# Patient Record
Sex: Female | Born: 1990 | Race: Black or African American | Hispanic: No | Smoking: Never smoker
Health system: Southern US, Community
[De-identification: ages and names within clinical notes are randomized; demographics above are authoritative.]

## PROBLEM LIST (undated history)

## (undated) ENCOUNTER — Inpatient Hospital Stay: Payer: Self-pay

## (undated) DIAGNOSIS — G43909 Migraine, unspecified, not intractable, without status migrainosus: Secondary | ICD-10-CM

## (undated) HISTORY — PX: WRIST SURGERY: SHX841

---
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
## 2014-06-29 NOTE — L&D Delivery Note (Signed)
VAGINAL DELIVERY NOTE:  Date of Delivery: 01/31/2015 [facility] Gestational Age/EDD: [redacted]w[redacted]d 02/15/2015, by Ultrasound Antepartum complications: none Attending Physician: Annamarie Major, MD, FACOG Delivery Type: spontaneous vaginal delivery  Anesthesia: none Laceration: n/a Episiotomy: none Placenta: spontaneous Intrapartum complications: None Estimated Blood Loss: GBS: pos Procedure Details: NSVD of viable female infant in OA pos with rotation to ROA and no CAN. Baby delivered: Vtx, ant and post shoulder and body del at    . To mom's abd wth 3 VC and CCx2 and cut per dad. Bonding with baby for skin to skin. No cord blood needed. SDOP intact. No sutures needed. 1 lt periurethral abrasion but, non-bleeding. FF and lochia mod. Hemostasis achieved. VSS.   Baby: Liveborn female, Apgars 9/9, weight 7#, 12oz

## 2014-07-31 LAB — OB RESULTS CONSOLE RUBELLA ANTIBODY, IGM: Rubella: IMMUNE

## 2014-07-31 LAB — OB RESULTS CONSOLE RPR: RPR: NONREACTIVE

## 2014-07-31 LAB — OB RESULTS CONSOLE VARICELLA ZOSTER ANTIBODY, IGG: Varicella: IMMUNE

## 2014-07-31 LAB — OB RESULTS CONSOLE GC/CHLAMYDIA
Chlamydia: NEGATIVE
GC PROBE AMP, GENITAL: NEGATIVE

## 2015-01-06 ENCOUNTER — Observation Stay
Admission: EM | Admit: 2015-01-06 | Discharge: 2015-01-06 | Disposition: A | Discharge status: Home / Self Care | Payer: 59 | Attending: Obstetrics and Gynecology | Admitting: Obstetrics and Gynecology

## 2015-01-06 DIAGNOSIS — Z3A34 34 weeks gestation of pregnancy: Secondary | ICD-10-CM | POA: Insufficient documentation

## 2015-01-06 DIAGNOSIS — Z3490 Encounter for supervision of normal pregnancy, unspecified, unspecified trimester: Secondary | ICD-10-CM

## 2015-01-06 NOTE — Progress Notes (Signed)
Nitrazine to perineum was negative.

## 2015-01-06 NOTE — OB Triage Note (Signed)
Pt to L&D c/o ? Contractions since 2300. Denies VB.  States there was a small gush of fluid.  + FM

## 2015-01-06 NOTE — Progress Notes (Signed)
Report given to Dr. Feliberto GottronSchermerhorn

## 2015-01-06 NOTE — Progress Notes (Signed)
Pt given d/c inst. And verbalized understanding.   Pt was then d/c home in stable condition with her mother and FOB

## 2015-01-11 ENCOUNTER — Observation Stay
Admission: EM | Admit: 2015-01-11 | Discharge: 2015-01-11 | Disposition: A | Discharge status: Home / Self Care | Payer: 59 | Attending: Obstetrics and Gynecology | Admitting: Obstetrics and Gynecology

## 2015-01-11 DIAGNOSIS — O26899 Other specified pregnancy related conditions, unspecified trimester: Secondary | ICD-10-CM

## 2015-01-11 DIAGNOSIS — R109 Unspecified abdominal pain: Secondary | ICD-10-CM | POA: Diagnosis present

## 2015-01-11 DIAGNOSIS — O26893 Other specified pregnancy related conditions, third trimester: Secondary | ICD-10-CM | POA: Diagnosis not present

## 2015-01-11 DIAGNOSIS — Z3A35 35 weeks gestation of pregnancy: Secondary | ICD-10-CM | POA: Insufficient documentation

## 2015-01-11 MED ORDER — TERBUTALINE SULFATE 1 MG/ML IJ SOLN
0.2500 mg | Freq: Once | INTRAMUSCULAR | Status: AC
  Administered 2015-01-11: 0.25 mg via SUBCUTANEOUS

## 2015-01-11 MED ORDER — TERBUTALINE SULFATE 1 MG/ML IJ SOLN
INTRAMUSCULAR | Status: AC
  Administered 2015-01-11: 0.25 mg via SUBCUTANEOUS
  Filled 2015-01-11: qty 1

## 2015-01-11 NOTE — H&P (Signed)
Arynn Olam IdlerM Kayes is a 24 y.o. female presenting for "UC's starting this afternoorn, no vag bleeding, no LOF, no decreased fM or any other complaints. [facility] for [facility] pregnancy, G2P0010 with EDD of 02/14/15 by LMP of 12/30/13. UC's are not increasing in intensity.  Maternal Medical History:  Reason for admission: Contractions.   Contractions: Onset was 3-5 hours ago.   Frequency: regular.   Perceived severity is mild.    Fetal activity: Perceived fetal activity is normal.    Prenatal complications: No bleeding, cholelithiasis, HIV, PIH, infection, IUGR, nephrolithiasis, oligohydramnios, placental abnormality, polyhydramnios, pre-eclampsia, preterm labor, substance abuse, thrombocytopenia or thrombophilia.     OB History    Gravida Para Term Preterm AB TAB SAB Ectopic Multiple Living   3 0 0 0 1     0     PMH: Lt wrist fx  Past Surgical History  Procedure Laterality Date  . Wrist surgery Left    Family History: family history is not on file. Social History:  reports that she has never smoked. She has never used smokeless tobacco. She reports that she does not drink alcohol or use illicit drugs.   Prenatal Transfer Tool  Maternal Diabetes: No Genetic Screening: Normal Maternal Ultrasounds/Referrals: Normal Fetal Ultrasounds or other Referrals:  None, Other:  Maternal Substance Abuse:  No Significant Maternal Medications:  None Significant Maternal Lab Results:  None Other Comments:  None  ROS  Dilation: Closed Effacement (%): Thick Exam by:: JYB Blood pressure 116/63, pulse 89, temperature 98.8 F (37.1 C), temperature source Oral, resp. rate 18. Exam Physical Exam  Gen: 24 you black female in NAD Heart: S1S2, RRR, neg M/R/G Lungs: CTA bilat Abd: Gravid NST: +accels 10 x 10 bpm, +accels, rare variable down to 116. Cx: closed/post/ vtx Prenatal labs: ABO, Rh:   B pos Antibody:  Neg Rubella:  Immune RPR:   NR HBsAg:   neg HIV:    Neg Varicella: immune Pap: ASCUS GBS:   not done yet  Assessment/Plan: A: IUP at 35 4/7 weeks 2. Oberve for labor pattern P: Terbutaline 0.25 mg SQ to stop labor pattern. 2. Increase PO fluids.   Milon ScoreJONES, CARON W 01/11/2015, 6:59 PM

## 2015-01-11 NOTE — Progress Notes (Signed)
Patient ID: Olivia Frost, female   DOB: 1990/08/30, 24 y.o.   MRN: [id number] No further pattern of UC's necessitating any more Terb,,Disc with pt and she wants to go home. Advised to call or return for any concerns.

## 2015-01-11 NOTE — Progress Notes (Signed)
Entered at the wrong

## 2015-01-11 NOTE — OB Triage Note (Signed)
Patient presents with c/o lower abdominal cramping rated 1/10.  Patient reports positive fetal movement, denies vaginal bleeding/LOF.  Beatriz Stallion. Jones CNM notified of pt c/o and assessment.  CNM order for observation status with NST and perform SVE.  If NST reactive d/c home.

## 2015-01-11 NOTE — Progress Notes (Signed)
Ezrah Olam IdlerM Wisinski is a 24 y.o. G3P0010 at 2444w0d by ultrasound at 13 weeks admitted for intermittant abd pain. [facility]. Called office and told to come here for evaluation. No LOF, VB, decreased FM,. Active labor pattern.  Subjective: Having intermittant   Objective: BP 116/63 mmHg  Pulse 89  Temp(Src) 98.8 F (37.1 C) (Oral)  Resp 18      FHT:  FHR: 150, no decels, +REACTIVE NSt bpm, variability: moderate,  accelerations:  Present,  decelerations:  Absent UC:   irregular, none SVE:   Dilation: Closed Effacement (%): Thick Exam by:: JYB  Labs: No results found for: WBC, HGB, HCT, MCV, PLT  Assessment / Plan: IUP AT 35 WEEKS  Labor: NONE Preeclampsia:  NONE Fetal Wellbeing:  Category I Pain Control:  Labor support without medications I/D:  n/a Anticipated MOD:  Dc HOME  Sharee PimpleJONES, Katrice Goel W 01/11/2015, 6:35 PM

## 2015-01-21 LAB — OB RESULTS CONSOLE GBS: STREP GROUP B AG: POSITIVE

## 2015-01-31 ENCOUNTER — Encounter: Payer: Self-pay | Admitting: *Deleted

## 2015-01-31 ENCOUNTER — Inpatient Hospital Stay: Payer: 59 | Admitting: Anesthesiology

## 2015-01-31 ENCOUNTER — Inpatient Hospital Stay
Admission: EM | Admit: 2015-01-31 | Discharge: 2015-02-02 | DRG: 775 | Disposition: A | Discharge status: Home / Self Care | Payer: 59 | Attending: Obstetrics and Gynecology | Admitting: Obstetrics and Gynecology

## 2015-01-31 DIAGNOSIS — O99820 Streptococcus B carrier state complicating pregnancy: Secondary | ICD-10-CM | POA: Diagnosis present

## 2015-01-31 DIAGNOSIS — Z3A37 37 weeks gestation of pregnancy: Secondary | ICD-10-CM | POA: Diagnosis present

## 2015-01-31 LAB — ABO/RH: ABO/RH(D): B POS

## 2015-01-31 LAB — CBC
HCT: 26.9 % — ABNORMAL LOW (ref 35.0–47.0)
Hemoglobin: 8.5 g/dL — ABNORMAL LOW (ref 12.0–16.0)
MCH: 23.5 pg — ABNORMAL LOW (ref 26.0–34.0)
MCHC: 31.6 g/dL — ABNORMAL LOW (ref 32.0–36.0)
MCV: 74.3 fL — ABNORMAL LOW (ref 80.0–100.0)
PLATELETS: 323 10*3/uL (ref 150–440)
RBC: 3.62 MIL/uL — ABNORMAL LOW (ref 3.80–5.20)
RDW: 17.3 % — AB (ref 11.5–14.5)
WBC: 14.9 10*3/uL — ABNORMAL HIGH (ref 3.6–11.0)

## 2015-01-31 LAB — POCT NITRAZINE TEST: POCT NITRAZINE (AMNIOSURE): NEGATIVE

## 2015-01-31 LAB — TYPE AND SCREEN
ABO/RH(D): B POS
Antibody Screen: NEGATIVE

## 2015-01-31 LAB — CHLAMYDIA/NGC RT PCR (ARMC ONLY)
Chlamydia Tr: NOT DETECTED
N GONORRHOEAE: NOT DETECTED

## 2015-01-31 MED ORDER — BUPIVACAINE HCL (PF) 0.25 % IJ SOLN
INTRAMUSCULAR | Status: DC | PRN
  Administered 2015-01-31: 3 mL
  Administered 2015-01-31: 5 mL

## 2015-01-31 MED ORDER — OXYTOCIN 40 UNITS IN LACTATED RINGERS INFUSION - SIMPLE MED: 62.5000 mL/h | INTRAVENOUS | Status: DC | PRN

## 2015-01-31 MED ORDER — ACETAMINOPHEN 325 MG PO TABS: 650.0000 mg | ORAL_TABLET | ORAL | Status: DC | PRN

## 2015-01-31 MED ORDER — DIBUCAINE 1 % RE OINT: 1.0000 "application " | TOPICAL_OINTMENT | RECTAL | Status: DC | PRN

## 2015-01-31 MED ORDER — WITCH HAZEL-GLYCERIN EX PADS: 1.0000 "application " | MEDICATED_PAD | CUTANEOUS | Status: DC | PRN

## 2015-01-31 MED ORDER — LANOLIN HYDROUS EX OINT: TOPICAL_OINTMENT | CUTANEOUS | Status: DC | PRN

## 2015-01-31 MED ORDER — ONDANSETRON HCL 4 MG/2ML IJ SOLN: 4.0000 mg | Freq: Four times a day (QID) | INTRAMUSCULAR | Status: DC | PRN

## 2015-01-31 MED ORDER — BENZOCAINE-MENTHOL 20-0.5 % EX AERO: 1.0000 "application " | INHALATION_SPRAY | CUTANEOUS | Status: DC | PRN

## 2015-01-31 MED ORDER — CITRIC ACID-SODIUM CITRATE 334-500 MG/5ML PO SOLN: 30.0000 mL | ORAL | Status: DC | PRN

## 2015-01-31 MED ORDER — OXYCODONE-ACETAMINOPHEN 5-325 MG PO TABS: 1.0000 | ORAL_TABLET | ORAL | Status: DC | PRN

## 2015-01-31 MED ORDER — BUTORPHANOL TARTRATE 1 MG/ML IJ SOLN
1.0000 mg | INTRAMUSCULAR | Status: DC | PRN
  Administered 2015-01-31: 1 mg via INTRAVENOUS
  Filled 2015-01-31: qty 1

## 2015-01-31 MED ORDER — DIPHENHYDRAMINE HCL 50 MG/ML IJ SOLN: 12.5000 mg | INTRAMUSCULAR | Status: DC | PRN

## 2015-01-31 MED ORDER — ONDANSETRON HCL 4 MG/2ML IJ SOLN: 4.0000 mg | INTRAMUSCULAR | Status: DC | PRN

## 2015-01-31 MED ORDER — ONDANSETRON HCL 4 MG PO TABS: 4.0000 mg | ORAL_TABLET | ORAL | Status: DC | PRN

## 2015-01-31 MED ORDER — OXYTOCIN 40 UNITS IN LACTATED RINGERS INFUSION - SIMPLE MED
INTRAVENOUS | Status: AC
  Filled 2015-01-31: qty 1000

## 2015-01-31 MED ORDER — LIDOCAINE HCL (PF) 1 % IJ SOLN
INTRAMUSCULAR | Status: DC | PRN
  Administered 2015-01-31: 3 mL via INTRADERMAL

## 2015-01-31 MED ORDER — OXYCODONE-ACETAMINOPHEN 5-325 MG PO TABS
1.0000 | ORAL_TABLET | ORAL | Status: DC | PRN
Start: 2015-01-31 — End: 2015-01-31

## 2015-01-31 MED ORDER — SENNOSIDES-DOCUSATE SODIUM 8.6-50 MG PO TABS
2.0000 | ORAL_TABLET | ORAL | Status: DC
Start: 2015-02-01 — End: 2015-02-02

## 2015-01-31 MED ORDER — PENICILLIN G POTASSIUM 5000000 UNITS IJ SOLR
5.0000 10*6.[IU] | Freq: Once | INTRAVENOUS | Status: AC
  Administered 2015-01-31: 5 10*6.[IU] via INTRAVENOUS
  Filled 2015-01-31: qty 5

## 2015-01-31 MED ORDER — SODIUM CHLORIDE 0.9 % IJ SOLN: 3.0000 mL | Freq: Two times a day (BID) | INTRAMUSCULAR | Status: DC

## 2015-01-31 MED ORDER — LIDOCAINE HCL (PF) 1 % IJ SOLN
30.0000 mL | INTRAMUSCULAR | Status: DC | PRN
  Filled 2015-01-31: qty 30

## 2015-01-31 MED ORDER — FENTANYL 2.5 MCG/ML W/ROPIVACAINE 0.2% IN NS 100 ML EPIDURAL INFUSION (ARMC-ANES): 9.0000 mL/h | EPIDURAL | Status: DC

## 2015-01-31 MED ORDER — OXYTOCIN BOLUS FROM INFUSION
500.0000 mL | INTRAVENOUS | Status: DC
  Administered 2015-01-31: 500 mL via INTRAVENOUS

## 2015-01-31 MED ORDER — LACTATED RINGERS IV SOLN
500.0000 mL | INTRAVENOUS | Status: DC | PRN
Start: 2015-01-31 — End: 2015-01-31

## 2015-01-31 MED ORDER — OXYTOCIN 40 UNITS IN LACTATED RINGERS INFUSION - SIMPLE MED: 62.5000 mL/h | INTRAVENOUS | Status: DC

## 2015-01-31 MED ORDER — IBUPROFEN 600 MG PO TABS
600.0000 mg | ORAL_TABLET | Freq: Four times a day (QID) | ORAL | Status: DC
  Administered 2015-02-01 – 2015-02-02 (×6): 600 mg via ORAL
  Filled 2015-01-31 (×6): qty 1

## 2015-01-31 MED ORDER — ACETAMINOPHEN 325 MG PO TABS
650.0000 mg | ORAL_TABLET | ORAL | Status: DC | PRN
  Administered 2015-01-31: 650 mg via ORAL
  Filled 2015-01-31: qty 2

## 2015-01-31 MED ORDER — OXYCODONE-ACETAMINOPHEN 5-325 MG PO TABS: 2.0000 | ORAL_TABLET | ORAL | Status: DC | PRN

## 2015-01-31 MED ORDER — PHENYLEPHRINE 40 MCG/ML (10ML) SYRINGE FOR IV PUSH (FOR BLOOD PRESSURE SUPPORT)
80.0000 ug | PREFILLED_SYRINGE | INTRAVENOUS | Status: DC | PRN
  Filled 2015-01-31: qty 2

## 2015-01-31 MED ORDER — SODIUM CHLORIDE 0.9 % IJ SOLN: 3.0000 mL | INTRAMUSCULAR | Status: DC | PRN

## 2015-01-31 MED ORDER — LACTATED RINGERS IV SOLN
INTRAVENOUS | Status: DC
Start: 2015-01-31 — End: 2015-01-31
  Administered 2015-01-31 (×2): via INTRAVENOUS

## 2015-01-31 MED ORDER — FLEET ENEMA 7-19 GM/118ML RE ENEM: 1.0000 | ENEMA | Freq: Every day | RECTAL | Status: DC | PRN

## 2015-01-31 MED ORDER — SODIUM CHLORIDE 0.9 % IV SOLN: 250.0000 mL | INTRAVENOUS | Status: DC | PRN

## 2015-01-31 MED ORDER — DEXTROSE 5 % IV SOLN
2.5000 10*6.[IU] | INTRAVENOUS | Status: DC
  Administered 2015-01-31: 2.5 10*6.[IU] via INTRAVENOUS
  Filled 2015-01-31 (×7): qty 2.5

## 2015-01-31 MED ORDER — LIDOCAINE-EPINEPHRINE (PF) 1.5 %-1:200000 IJ SOLN
INTRAMUSCULAR | Status: DC | PRN
  Administered 2015-01-31: 3 mL via PERINEURAL

## 2015-01-31 MED ORDER — PRENATAL MULTIVITAMIN CH
1.0000 | ORAL_TABLET | Freq: Every day | ORAL | Status: DC
  Administered 2015-02-01 – 2015-02-02 (×2): 1 via ORAL
  Filled 2015-01-31 (×2): qty 1

## 2015-01-31 MED ORDER — SIMETHICONE 80 MG PO CHEW: 80.0000 mg | CHEWABLE_TABLET | ORAL | Status: DC | PRN

## 2015-01-31 MED ORDER — DIPHENHYDRAMINE HCL 25 MG PO CAPS: 25.0000 mg | ORAL_CAPSULE | Freq: Four times a day (QID) | ORAL | Status: DC | PRN

## 2015-01-31 MED ORDER — FENTANYL 2.5 MCG/ML W/ROPIVACAINE 0.2% IN NS 100 ML EPIDURAL INFUSION (ARMC-ANES)
EPIDURAL | Status: AC
  Administered 2015-01-31: 9 mL/h via EPIDURAL
  Filled 2015-01-31: qty 100

## 2015-01-31 MED ORDER — BISACODYL 10 MG RE SUPP: 10.0000 mg | Freq: Every day | RECTAL | Status: DC | PRN

## 2015-01-31 MED ORDER — MEASLES, MUMPS & RUBELLA VAC ~~LOC~~ INJ
0.5000 mL | INJECTION | Freq: Once | SUBCUTANEOUS | Status: DC
Start: 2015-02-01 — End: 2015-02-02

## 2015-01-31 MED ORDER — ZOLPIDEM TARTRATE 5 MG PO TABS: 5.0000 mg | ORAL_TABLET | Freq: Every evening | ORAL | Status: DC | PRN

## 2015-01-31 MED ORDER — EPHEDRINE 5 MG/ML INJ
10.0000 mg | INTRAVENOUS | Status: DC | PRN
  Filled 2015-01-31: qty 2

## 2015-01-31 NOTE — OB Triage Note (Signed)
Prolonged decel noted x 5 minutes. Position changed , iv started, o2 on at 10l via mask and provider notified

## 2015-01-31 NOTE — Anesthesia Procedure Notes (Addendum)
Epidural Patient location during procedure: OB  Staffing Performed by: resident/CRNA   Preanesthetic Checklist Completed: patient identified, site marked, surgical consent, pre-op evaluation, timeout performed, IV checked, risks and benefits discussed and monitors and equipment checked  Epidural Patient position: sitting Prep: Betadine Patient monitoring: heart rate, continuous pulse ox and blood pressure Approach: midline Location: L4-L5 Injection technique: LOR air  Needle:  Needle type: Tuohy  Needle gauge: 18 G Needle length: 9 cm and 9 Needle insertion depth: 5 cm Catheter type: closed end flexible Catheter size: 20 Guage Catheter at skin depth: 10 cm Test dose: negative and 1.5% lidocaine with Epi 1:200 K  Assessment Sensory level: T10 Events: blood not aspirated, injection not painful, no injection resistance, negative IV test and no paresthesia  Additional Notes Pt's history reviewed and consent obtained as per OB consent Patient tolerated the insertion well without complications. Negative SATD, negative IVTD All VSS were obtained and monitored through OBIX and nursing protocols followed.Reason for block:procedure for pain  Epidural Patient location during procedure: OB Start time: 01/31/2015 8:55 AM End time: 01/31/2015 9:15 AM  Staffing Resident/CRNA: Sanjana Folz Performed by: resident/CRNA   Preanesthetic Checklist Completed: patient identified, site marked, surgical consent, pre-op evaluation, timeout performed, IV checked, risks and benefits discussed and monitors and equipment checked  Epidural Patient position: sitting Prep: Betadine Patient monitoring: heart rate, continuous pulse ox and blood pressure Approach: midline Location: L4-L5 Injection technique: LOR saline  Needle:  Needle type: Tuohy  Needle gauge: 18 G Needle length: 9 cm and 9 Needle insertion depth: 7 cm Catheter type: closed end flexible Catheter size: 20 Guage Catheter at  skin depth: 12 cm Test dose: negative and 1.5% lidocaine with Epi 1:200 K  Assessment Sensory level: T10 Events: blood not aspirated, injection not painful, no injection resistance, negative IV test and no paresthesia  Additional Notes   Patient tolerated the insertion well without complications.Reason for block:procedure for pain

## 2015-01-31 NOTE — OB Triage Note (Addendum)
Triage Note:  CC: contractions  LMP: 12/30/13 EDD: 02/14/15 by 13 wk sono  HPI: 24 y.o. G2P0010 at [redacted]w[redacted]d by 13 week ultrasound here for labor complaints. Q11min, uncomfortable but able to talk through them. No gush of fluid, but some small leakage. +FM, no VB.   APC: [facility]  Factors complicating this pregnancy   Amenia: Taking Fe three times a day.   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 Only 1 outbreak ever, a couple years ago.   Chlamydia + on 7/21 exam - s/p azithro  Needs toc at delivery or week of 8/15   Screening results and needs:  NOB:   MBT B +  Ab screen neg  Pap ASCUS, repeat postpartum HIV/Hep B/RPR negative  Rubella Imm   VZV Imm  Aneuploidy:   First trimester (Informaseq, NT): too late   Second trimester (AFP/tetra):  Negative  28 weeks:   Hgb: 8.3  Glucola: 100 Rhogam: N/A  ADD Fe TID  36 weeks: 01/17/15 collected  [id number]   G/C: Chlamydia Positive , Azithromycin 1gm dose 01/21/15  Hgb: 8.4 Fe TID,  Last [place]: 8/1 Vertex Fhr=144 bpm Fundal rt lat plac Afi=128 mm @ 45% Efw=3214 g @ 55%  Immunization:    Flu in season -   Tdap at 27-36 weeks - done 11/28/14  Social: no changes  Contraception Plan: Discussed LARC methods, Nexplan vs IUD  Feeding Plan: Breastfeeting  History reviewed. No pertinent past medical history. Past Surgical History  Procedure Laterality Date  . Wrist surgery Left    History reviewed. No pertinent family history.  History   Social History  . Marital Status: Single    Spouse Name: N/A  . Number of Children: N/A  . Years of Education: N/A   Occupational History  . Not on file.   Social History Main Topics  . Smoking status: Never Smoker   . Smokeless tobacco: Never Used  . Alcohol Use: No  . Drug Use: No  . Sexual Activity: Yes   Other Topics Concern  . Not on file   Social History Narrative    O: BP 125/78 mmHg  Pulse 87  Temp(Src) 97.8 F (36.6 C) (Oral)  Resp 18  Physical  Exam  Constitutional: She is oriented to person, place, and time. She appears well-developed and well-nourished.  HENT:  Head: Normocephalic.  Eyes: Pupils are equal, round, and reactive to light.  Neck: Normal range of motion.  Cardiovascular: Normal rate.   Pulmonary/Chest: Effort normal.  Abdominal: Soft.  Neurological: She is alert and oriented to person, place, and time.  Skin: Skin is warm and dry.  Psychiatric: She has a normal mood and affect. Her behavior is normal.   Pelvic: 1-2/80/-2 per RN nitrazine neg, no pool  EFM: 130 mod var, +accels, 1 late decel at 5:40am Toco: Q4-71min  A/P: 24 y.o. G2P0010 at [redacted]w[redacted]d by 13 week ultrasound here for labor complaints. In early labor, but amenable to walking at home until more uncomfortable. Will continue to monitor tracing for another 30 minutes given the late deceleration.    Addendum:  Re-examination by RN - SVE now 3/90/-2; no lesions on perineum SSE: no lesion internally, no pooling EFM: 130 mod var, +accels, 1 late decel at 6:20 (subtle)  Toco: now Q3-60min  Given patient's cervical change and continued cat 2 tracing, will admit for labor.   - continuous monitoring - admission labs - Gonorrhea TOC - no evidence of HSV lesions -  GBS +, will start ampicillin per protocol - pain control prn    Risks, benefits, alternatives and possible complications have been discussed in detail with the patient.  Pre-admission, admission, and post admission procedures and expectations were discussed in detail.  All questions answered, all appropriate consents will be signed at the Hospital.   Ala Dach, MD

## 2015-01-31 NOTE — Progress Notes (Signed)
Analyah MACARIA Frost is a 24 y.o. G2P0010 at [redacted]w[redacted]d by LMP admitted for active labor.   Subjective: Feels rectal pressure  Objective: BP 109/57 mmHg  Pulse 94  Temp(Src) 98.6 F (37 C) (Oral)  Resp 18  Ht  (1.702 m)  Wt 88.905 kg (196 lb)  BMI 30.69 kg/m2  SpO2 100%      FHT:  FHR: 135, mod variablity, no decels seen.Cat I bpm, variability: moderate,  accelerations:  Present,  decelerations:  Absent UC:   regular, every 2  minutes SVE:   Dilation: 10 Effacement (%): 100 Station: 0 Exam by:: s greene,rn  Labs: Lab Results  Component Value Date   WBC 14.9* 01/31/2015   HGB 8.5* 01/31/2015   HCT 26.9* 01/31/2015   MCV 74.3* 01/31/2015   PLT 323 01/31/2015    Assessment / Plan: Augmentation of labor, progressing well  Labor: Progressing normally Preeclampsia:  none Fetal Wellbeing:  Category I Pain Control:  Epidural I/D:  n/a Anticipated MOD:  NSVD  Will start Pushing  [name] 01/31/2015, 1:47 PM

## 2015-01-31 NOTE — Lactation Note (Signed)
This note was copied from the chart of Olivia Lourine Horsman. Lactation Consultation Note  Patient Name: Olivia Frost Today's Date: 01/31/2015 Reason for consult: Initial assessment   Maternal Data    Feeding Feeding Type: Breast Fed Length of feed: 10 min  LATCH Score/Interventions Latch: Repeated attempts needed to sustain latch, nipple held in mouth throughout feeding, stimulation needed to elicit sucking reflex. Intervention(s): Assist with latch  Audible Swallowing: Spontaneous and intermittent  Type of Nipple: Everted at rest and after stimulation  Comfort (Breast/Nipple): Soft / non-tender     Hold (Positioning): Full assist, staff holds infant at breast Intervention(s): Breastfeeding basics reviewed;Support Pillows;Position options  LATCH Score: 7  Lactation Tools Discussed/Used     Consult Status Consult Status: Follow-up    Trudee Grip 01/31/2015, 6:19 PM

## 2015-01-31 NOTE — Anesthesia Preprocedure Evaluation (Signed)
Anesthesia Evaluation  Patient identified by MRN, date of birth, ID band Patient awake    Reviewed: Allergy & Precautions, H&P , NPO status , Patient's Chart, lab work & pertinent test results, reviewed documented beta blocker date and time   Airway Mallampati: II  TM Distance: >3 FB Neck ROM: full    Dental no notable dental hx. (+) Teeth Intact   Pulmonary neg pulmonary ROS,  breath sounds clear to auscultation  Pulmonary exam normal       Cardiovascular Exercise Tolerance: Good negative cardio ROS Normal cardiovascular examRhythm:regular Rate:Normal     Neuro/Psych negative neurological ROS  negative psych ROS   GI/Hepatic negative GI ROS, Neg liver ROS,   Endo/Other  negative endocrine ROS  Renal/GU negative Renal ROS  negative genitourinary   Musculoskeletal   Abdominal   Peds  Hematology negative hematology ROS (+)   Anesthesia Other Findings   Reproductive/Obstetrics negative OB ROS (+) Pregnancy                             Anesthesia Physical Anesthesia Plan  ASA: II  Anesthesia Plan: General   Post-op Pain Management:    Induction:   Airway Management Planned:   Additional Equipment:   Intra-op Plan:   Post-operative Plan:   Informed Consent: I have reviewed the patients History and Physical, chart, labs and discussed the procedure including the risks, benefits and alternatives for the proposed anesthesia with the patient or authorized representative who has indicated his/her understanding and acceptance.   Dental Advisory Given  Plan Discussed with: CRNA  Anesthesia Plan Comments:         Anesthesia Quick Evaluation

## 2015-01-31 NOTE — Progress Notes (Signed)
Olivia Frost is a 24 y.o. G2P0010 at [redacted]w[redacted]d by LMP admitted for active labor  Subjective: Hurting and Epidural being done.   Objective: BP 138/75 mmHg  Pulse 94  Temp(Src) 97.5 F (36.4 C) (Oral)  Resp 18      FHT:  FHR: 130, Cat I, minimal variability, pt sitting for epidural with some decels due to mat HR being picked up bpm, variability: minimal ,  accelerations:  Present,  decelerations:  Absent UC:   regular, every 3  Minutes, mod SVE:   Dilation: 4 Effacement (%): 90 Station: -1 Exam by:: HFC  Labs: Lab Results  Component Value Date   WBC 14.9* 01/31/2015   HGB 8.5* 01/31/2015   HCT 26.9* 01/31/2015   MCV 74.3* 01/31/2015   PLT 323 01/31/2015    Assessment / Plan: Spontaneous labor, progressing normally  Labor: Progressing normally Preeclampsia:  no signs of pre-ex Fetal Wellbeing:  Category I Pain Control:  Epidural I/D:  n/a Anticipated MOD:  NSVD  Olivia Frost 01/31/2015, 8:56 AM

## 2015-01-31 NOTE — Progress Notes (Signed)
Patient ID: Olivia Frost, female   DOB: 01-16-1991, 24 y.o.   MRN: [id number] Late entry: Arrived to review pt status: UC's q 3 mins, mod. FHR 140-150, no decels. Cx: 9/100/vtx0 Continue to labor. 1315 Dr Bernestine Amass given report on pt and aware of pt status and no new orders indicated.

## 2015-02-01 LAB — CBC
HCT: 26.1 % — ABNORMAL LOW (ref 35.0–47.0)
Hemoglobin: 8.2 g/dL — ABNORMAL LOW (ref 12.0–16.0)
MCH: 23.2 pg — ABNORMAL LOW (ref 26.0–34.0)
MCHC: 31.4 g/dL — AB (ref 32.0–36.0)
MCV: 73.9 fL — ABNORMAL LOW (ref 80.0–100.0)
Platelets: 278 10*3/uL (ref 150–440)
RBC: 3.53 MIL/uL — AB (ref 3.80–5.20)
RDW: 17.4 % — ABNORMAL HIGH (ref 11.5–14.5)
WBC: 23.4 10*3/uL — ABNORMAL HIGH (ref 3.6–11.0)

## 2015-02-01 LAB — RPR: RPR: NONREACTIVE

## 2015-02-01 NOTE — Anesthesia Postprocedure Evaluation (Signed)
  Anesthesia Post-op Note  Patient: Olivia Frost  Procedure(s) Performed: * No procedures listed *  Anesthesia type:General  Patient location: PACU  Post pain: Pain level controlled  Post assessment: Post-op Vital signs reviewed, Patient's Cardiovascular Status Stable, Respiratory Function Stable, Patent Airway and No signs of Nausea or vomiting  Post vital signs: Reviewed and stable  Last Vitals:  Filed Vitals:   02/01/15 0502  BP: 120/73  Pulse: 69  Temp: 36.7 C  Resp: 18    Level of consciousness: awake, alert  and patient cooperative  Complications: No apparent anesthesia complications

## 2015-02-01 NOTE — Lactation Note (Addendum)
This note was copied from the chart of Olivia Oria Cai. Lactation Consultation Note  Patient Name: Olivia Frost Today's Date: 02/01/2015 Reason for consult: Initial assessment  Pt using 20 mm nipple shield, turned to left side with baby beside her, able to latch baby without shield by compressing soft breast tissue and baby able to latch and nursed well consistently 15-20 min. Recommended to mom to pump breast before latching to pull out nipple to ease latch and she was given a breast pump kit and symphony pump with instruction to call before next attempt Maternal Data Does the patient have breastfeeding experience prior to this delivery?: No  Feeding Feeding Type: Breast Fed Length of feed: 15 min  LATCH Score/Interventions Latch: Grasps breast easily, tongue down, lips flanged, rhythmical sucking. Intervention(s): Assist with latch;Breast compression  Audible Swallowing: Spontaneous and intermittent  Type of Nipple: Flat Intervention(s): Double electric pump (shaping of breast tissue, breast soft)  Comfort (Breast/Nipple): Soft / non-tender     Hold (Positioning): Assistance needed to correctly position infant at breast and maintain latch.  LATCH Score: 8  Lactation Tools Discussed/Used WIC Program: No   Consult Status Consult Status: PRN    Ferol Luz 02/01/2015, 4:48 PM

## 2015-02-02 MED ORDER — LANOLIN HYDROUS EX OINT
1.0000 | TOPICAL_OINTMENT | CUTANEOUS | Status: DC | PRN
Start: 2015-02-02 — End: 2021-08-25

## 2015-02-02 MED ORDER — IBUPROFEN 600 MG PO TABS: 600.0000 mg | ORAL_TABLET | Freq: Four times a day (QID) | ORAL | Status: DC

## 2015-02-02 NOTE — Discharge Instructions (Signed)
Care After Vaginal Delivery °Congratulations on your new baby!! ° °Refer to this sheet in the next few weeks. These discharge instructions provide you with information on caring for yourself after delivery. Your caregiver may also give you specific instructions. Your treatment has been planned according to the most current medical practices available, but problems sometimes occur. Call your caregiver if you have any problems or questions after you go home. ° °HOME CARE INSTRUCTIONS °· Take over-the-counter or prescription medicines only as directed by your caregiver or pharmacist. °· Do not drink alcohol, especially if you are breastfeeding or taking medicine to relieve pain. °· Do not chew or smoke tobacco. °· Do not use illegal drugs. °· Continue to use good perineal care. Good perineal care includes: °¨ Wiping your perineum from front to back. °¨ Keeping your perineum clean. °· Do not use tampons or douche until your caregiver says it is okay. °· Shower, wash your hair, and take tub baths as directed by your caregiver. °· Wear a well-fitting bra that provides breast support. °· Eat healthy foods. °· Drink enough fluids to keep your urine clear or pale yellow. °· Eat high-fiber foods such as whole grain cereals and breads, brown rice, beans, and fresh fruits and vegetables every day. These foods may help prevent or relieve constipation. °· Follow your caregiver's recommendations regarding resumption of activities such as climbing stairs, driving, lifting, exercising, or traveling. Specifically, no driving for two weeks, so that you are comfortable reacting quickly in an emergency. °· Talk to your caregiver about resuming sexual activities. Resumption of sexual activities is dependent upon your risk of infection, your rate of healing, and your comfort and desire to resume sexual activity. Usually we recommend waiting about six weeks, or until your bleeding stops and you are interested in sex. °· Try to have someone  help you with your household activities and your newborn for at least a few days after you leave the hospital. Even longer is better. °· Rest as much as possible. Try to rest or take a nap when your newborn is sleeping. Sleep deprivation can be very hard after delivery. °· Increase your activities gradually. °· Keep all of your scheduled postpartum appointments. It is very important to keep your scheduled follow-up appointments. At these appointments, your caregiver will be checking to make sure that you are healing physically and emotionally. ° °SEEK MEDICAL CARE IF:  °· You are passing large clots from your vagina.  °· You have a foul smelling discharge from your vagina. °· You have trouble urinating. °· You are urinating frequently. °· You have pain when you urinate. °· You have a change in your bowel movements. °· You have increasing redness, pain, or swelling near your vaginal incision (episiotomy) or vaginal tear. °· You have pus draining from your episiotomy or vaginal tear. °· Your episiotomy or vaginal tear is separating. °· You have painful, hard, or reddened breasts. °· You have a severe headache. °· You have blurred vision or see spots. °· You feel sad or depressed. °· You have thoughts of hurting yourself or your newborn. °· You have questions about your care, the care of your newborn, or medicines. °· You are dizzy or light-headed. °· You have a rash. °· You have nausea or vomiting. °· You were breastfeeding and have not had a menstrual period within 12 weeks after you stopped breastfeeding. °· You are not breastfeeding and have not had a menstrual period by the 12th week after delivery. °· You   have a fever.  SEEK IMMEDIATE MEDICAL CARE IF:   You have persistent pain.  You have chest pain.  You have shortness of breath.  You faint.  You have leg pain.  You have stomach pain.  Your vaginal bleeding saturates two or more sanitary pads in 1 hour.  MAKE SURE YOU:   Understand these  instructions.  Will get help right away if you are not doing well or get worse.   Document Released: 06/12/2000 Document Revised: 10/30/2013 Document Reviewed: 02/10/2012  [facility] Patient Information 2015 [place], [place]. This information is not intended to replace advice given to you by your health care provider. Make sure you discuss any questions you have with your health care provider.  Call your doctor for increased pain or vaginal bleeding, temperature above 100.4, depression, or concerns.  No strenuous activity or heavy lifting for 6 weeks.  No intercourse, tampons, douching, or enemas for 6 weeks.  No tub baths-showers only.  No driving for 2 weeks or while taking pain medications.  Continue prenatal vitamin and iron.  Increase calorie and fluids while breastfeeding.

## 2015-02-02 NOTE — Progress Notes (Signed)
Discharge instructions provided.  Pt verbalizes understanding of all instructions and follow-up care.  Prescriptions given.  Pt discharged to home with infant at 1500 on 02/02/15 via wheelchair by RN. Reynold Bowen, RN 02/02/2015 6:00 PM

## 2015-02-02 NOTE — Progress Notes (Signed)
Unable to locate HIV result.  Dr. Dalbert Garnet contacted.  MD states pt may discharge to home without HIV result being obtained. Reynold Bowen, RN 02/02/2015 2:18 PM

## 2015-02-02 NOTE — Discharge Summary (Signed)
Obstetric Discharge Summary Reason for Admission: onset of labor Prenatal Procedures: none Intrapartum Procedures: spontaneous vaginal delivery Postpartum Procedures: none Complications-Operative and Postpartum: none HEMOGLOBIN  Date Value Ref Range Status  02/01/2015 8.2* 12.0 - 16.0 g/dL Final   HCT  Date Value Ref Range Status  02/01/2015 26.1* 35.0 - 47.0 % Final    Physical Exam:  General: alert, cooperative and no distress Lochia: appropriate Uterine Fundus: firm DVT Evaluation: No evidence of DVT seen on physical exam.  Discharge Diagnoses: Term Pregnancy-delivered  Discharge Information: Date: 02/02/2015 Activity: pelvic rest Diet: routine Medications: Ibuprofen Condition: stable Instructions: refer to practice specific booklet Discharge to: home Follow-up Information    Follow up with [name], CNM In 6 weeks.   Specialty:  Obstetrics and Gynecology   Why:  For postpartum visit   Contact information:   [address] [place] [address] [phone]       Newborn Data: Live born female  Birth Weight: 7 lb 12 oz (3515 g) APGAR: 9, 9  Home with mother  Breastfeeding.  Christeen Douglas 02/02/2015, 9:41 AM

## 2015-02-02 NOTE — Progress Notes (Signed)
Prenatal records indicate that pt received TDaP vaccine on 11/28/14. Reynold Bowen, RN 02/02/2015 1:56 PM

## 2015-02-06 NOTE — H&P (Signed)
CC: contractions  LMP: 12/30/13 EDD: 02/14/15 by 13 wk sono  HPI: 24 y.o. G2P0010 at [redacted]w[redacted]d by 13 week ultrasound here for labor complaints. Q31min, uncomfortable but able to talk through them. No gush of fluid, but some small leakage. +FM, no VB.   APC: [facility]  Factors complicating this pregnancy  Amenia: Taking Fe three times a day.   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 Only 1 outbreak ever, a couple years ago.   Chlamydia + on 7/21 exam - s/p azithro  Needs toc at delivery or week of 8/15   Screening results and needs:  NOB:   MBT B + Ab screen neg Pap ASCUS, repeat postpartum HIV/Hep B/RPR negative  Rubella Imm VZV Imm  Aneuploidy:   First trimester (Informaseq, NT): too late Second trimester (AFP/tetra): Negative  28 weeks:   Hgb: 8.3 Glucola: 100 Rhogam: N/A ADD Fe TID  36 weeks: 01/17/15 collected  [id number] G/C: Chlamydia Positive , Azithromycin 1gm dose 01/21/15 Hgb: 8.4 Fe TID, Last [place]: 8/1 Vertex Fhr=144 bpm Fundal rt lat plac Afi=128 mm @ 45% Efw=3214 g @ 55%  Immunization:   Flu in season -   Tdap at 27-36 weeks - done 11/28/14  Social: no changes  Contraception Plan: Discussed LARC methods, Nexplan vs IUD  Feeding Plan: Breastfeeting  History reviewed. No pertinent past medical history. Past Surgical History  Procedure Laterality Date  . Wrist surgery Left    History reviewed. No pertinent family history.  History   Social History  . Marital Status: Single    Spouse Name: N/A  . Number of Children: N/A  . Years of Education: N/A   Occupational History  . Not on file.   Social History Main Topics  . Smoking status: Never Smoker   . Smokeless tobacco: Never Used  . Alcohol Use: No  . Drug Use: No  . Sexual Activity: Yes   Other Topics Concern  . Not on file   Social History Narrative    O: BP 125/78 mmHg  Pulse 87   Temp(Src) 97.8 F (36.6 C) (Oral)  Resp 18  Physical Exam  Constitutional: She is oriented to person, place, and time. She appears well-developed and well-nourished.  HENT:  Head: Normocephalic.  Eyes: Pupils are equal, round, and reactive to light.  Neck: Normal range of motion.  Cardiovascular: Normal rate.  Pulmonary/Chest: Effort normal.  Abdominal: Soft.  Neurological: She is alert and oriented to person, place, and time.  Skin: Skin is warm and dry.  Psychiatric: She has a normal mood and affect. Her behavior is normal.   Pelvic: 1-2/80/-2 per RN nitrazine neg, no pool  EFM: 130 mod var, +accels, 1 late decel at 5:40am Toco: Q4-50min  A/P: 24 y.o. G2P0010 at [redacted]w[redacted]d by 13 week ultrasound here for labor complaints. In early labor, but amenable to walking at home until more uncomfortable. Will continue to monitor tracing for another 30 minutes given the late deceleration.    Addendum:  Re-examination by RN - SVE now 3/90/-2; no lesions on perineum SSE: no lesion internally, no pooling EFM: 130 mod var, +accels, 1 late decel at 6:20 (subtle)  Toco: now Q3-31min  Given patient's cervical change and continued cat 2 tracing, will admit for labor.   - continuous monitoring - admission labs - Gonorrhea TOC - no evidence of HSV lesions - GBS +, will start ampicillin per protocol - pain control prn   Risks, benefits, alternatives and possible complications  have been discussed in detail with the patient. Pre-admission, admission, and post admission procedures and expectations were discussed in detail. All questions answered, all appropriate consents will be signed at the Hospital.   Ala Dach, MD

## 2015-08-21 DIAGNOSIS — A6000 Herpesviral infection of urogenital system, unspecified: Secondary | ICD-10-CM | POA: Insufficient documentation

## 2018-07-22 ENCOUNTER — Encounter: Payer: Self-pay | Admitting: Emergency Medicine

## 2018-07-22 ENCOUNTER — Emergency Department: Payer: BLUE CROSS/BLUE SHIELD

## 2018-07-22 ENCOUNTER — Emergency Department
Admission: EM | Admit: 2018-07-22 | Discharge: 2018-07-23 | Disposition: A | Discharge status: Home / Self Care | Payer: BLUE CROSS/BLUE SHIELD | Attending: Emergency Medicine | Admitting: Emergency Medicine

## 2018-07-22 DIAGNOSIS — Y929 Unspecified place or not applicable: Secondary | ICD-10-CM | POA: Insufficient documentation

## 2018-07-22 DIAGNOSIS — Y939 Activity, unspecified: Secondary | ICD-10-CM | POA: Diagnosis not present

## 2018-07-22 DIAGNOSIS — Z79899 Other long term (current) drug therapy: Secondary | ICD-10-CM | POA: Diagnosis not present

## 2018-07-22 DIAGNOSIS — Y999 Unspecified external cause status: Secondary | ICD-10-CM | POA: Diagnosis not present

## 2018-07-22 DIAGNOSIS — S39012A Strain of muscle, fascia and tendon of lower back, initial encounter: Secondary | ICD-10-CM

## 2018-07-22 DIAGNOSIS — X58XXXA Exposure to other specified factors, initial encounter: Secondary | ICD-10-CM | POA: Insufficient documentation

## 2018-07-22 DIAGNOSIS — M5489 Other dorsalgia: Secondary | ICD-10-CM | POA: Diagnosis present

## 2018-07-22 LAB — POCT PREGNANCY, URINE: PREG TEST UR: NEGATIVE

## 2018-07-22 NOTE — ED Triage Notes (Signed)
Patient c/o left lower back pain. Patient seen at walk-in clinic on Monday for same. Patient prescribed steroids and muscle relaxer; reports no relief of symptoms with medications.

## 2018-07-22 NOTE — ED Provider Notes (Signed)
[facility] Emergency Department Provider Note  ____________________________________________   First MD Initiated Contact with Patient 07/22/18 2339     (approximate)  I have reviewed the triage vital signs and the nursing notes.   HISTORY  Chief Complaint Back Pain    HPI Olivia Frost is a 28 y.o. female who comes to the emergency department with roughly 1 week of intermittent left low back and left buttock pain.  The pain seems to radiate around towards her left hip and is worse with movement.  On Monday she went to urgent care who ordered an x-ray of her back and noted it was negative so they gave her steroids along with Flexeril which has not helped.  She has no history of steroid use.  No history of intravenous drug use.  The pain does not wake her at night.  No fevers or chills.  No numbness or weakness.    History reviewed. No pertinent past medical history.  Patient Active Problem List   Diagnosis Date Noted  . Normal labor 01/31/2015  . Abdominal pain affecting pregnancy 01/11/2015    Past Surgical History:  Procedure Laterality Date  . WRIST SURGERY Left     Prior to Admission medications   Medication Sig Start Date End Date Taking? Authorizing Provider  ferrous fumarate (HEMOCYTE - 106 MG FE) 325 (106 FE) MG TABS tablet Take 1 tablet by mouth.    [provider]  ibuprofen (ADVIL,MOTRIN) 600 MG tablet Take 1 tablet (600 mg total) by mouth every 6 (six) hours. 02/02/15   Christeen Douglas, MD  lanolin OINT Apply 1 application topically as needed (for breast care). 02/02/15   Christeen Douglas, MD  oxyCODONE-acetaminophen (PERCOCET/ROXICET) 5-325 MG tablet Take 1 tablet by mouth every 4 (four) hours as needed for severe pain. 07/23/18   Merrily Brittle, MD  Prenatal Vit-Fe Fumarate-FA (PRENATAL MULTIVITAMIN) TABS tablet Take 1 tablet by mouth daily at 12 noon.    [provider]    Allergies Patient has no known  allergies.  No family history on file.  Social History Social History   Tobacco Use  . Smoking status: Never Smoker  . Smokeless tobacco: Never Used  Substance Use Topics  . Alcohol use: Yes  . Drug use: No    Review of Systems Constitutional: No fever/chills ENT: No sore throat. Cardiovascular: Denies chest pain. Respiratory: Denies shortness of breath. Gastrointestinal: No abdominal pain.  No nausea, no vomiting.  No diarrhea.  No constipation. Musculoskeletal: Positive for back pain. Neurological: Negative for headaches   ____________________________________________   PHYSICAL EXAM:  VITAL SIGNS: ED Triage Vitals  Enc Vitals Group     BP 07/22/18 2239 95/60     Pulse Rate 07/22/18 2239 73     Resp 07/22/18 2239 18     Temp 07/22/18 2239 98.3 F (36.8 C)     Temp Source 07/22/18 2239 Oral     SpO2 07/22/18 2239 100 %     Weight 07/22/18 2240 196 lb 3.4 oz (89 kg)     Height --      Head Circumference --      Peak Flow --      Pain Score 07/22/18 2240 7     Pain Loc --      Pain Edu? --      Excl. in GC? --     Constitutional: Alert and oriented x4 appears extremely uncomfortable curled on her side and tearful Head: Atraumatic. Nose: No  congestion/rhinnorhea. Mouth/Throat: No trismus Neck: No stridor.   Cardiovascular: Regular rate and rhythm Respiratory: Normal respiratory effort.  No retractions. MSK: Exquisitely tender over multiple focal points on her left buttock and left lumbar back along with her left hip Neurologic:  Normal speech and language. No gross focal neurologic deficits are appreciated.  Skin:  Skin is warm, dry and intact. No rash noted.    ____________________________________________  LABS (all labs ordered are listed, but only abnormal results are displayed)  Labs Reviewed  URINALYSIS, COMPLETE (UACMP) WITH MICROSCOPIC - Abnormal; Notable for the following components:      Result Value   Color, Urine YELLOW (*)    APPearance  CLEAR (*)    Leukocytes, UA TRACE (*)    Bacteria, UA RARE (*)    All other components within normal limits  POCT PREGNANCY, URINE  POCT PREGNANCY, URINE    Urinalysis reviewed by me with no acute disease __________________________________________  EKG   ____________________________________________  RADIOLOGY  Lumbar x-ray reviewed by me with no acute disease ____________________________________________   DIFFERENTIAL includes but not limited to  Muscle strain, muscle spasm, cord compression   PROCEDURES  Procedure(s) performed: Yes  .Nerve Block Date/Time: 07/23/2018 1:35 AM Performed by: Merrily Brittle, MD Authorized by: Merrily Brittle, MD   Consent:    Consent obtained:  Verbal   Consent given by:  Patient   Risks discussed:  Allergic reaction, intravenous injection, pain, unsuccessful block and swelling   Alternatives discussed:  Alternative treatment Indications:    Indications:  Pain relief Location:    Nerve block body site: Trigger points on her left low back and buttocks. Pre-procedure details:    Skin preparation:  Alcohol Skin anesthesia (see MAR for exact dosages):    Skin anesthesia method:  None Procedure details (see MAR for exact dosages):    Block needle gauge:  25 G   Anesthetic injected:  Lidocaine 1% w/o epi Post-procedure details:    Dressing:  None   Outcome:  Pain relieved   Patient tolerance of procedure:  Tolerated well, no immediate complications    Critical Care performed: no  ____________________________________________   INITIAL IMPRESSION / ASSESSMENT AND PLAN / ED COURSE  Pertinent labs & imaging results that were available during my care of the patient were reviewed by me and considered in my medical decision making (see chart for details).   As part of my medical decision making, I reviewed the following data within the electronic MEDICAL RECORD NUMBER History obtained from family if available, nursing notes, old chart  and ekg, as well as notes from prior ED visits.  The patient came to the emergency department the patient came to the emergency department with musculoskeletal pain for the past several days in her left low back and buttocks.  Her pain is not improving with steroids and muscle relaxants.  She has multiple focal trigger points so I injected her a total of 10 times along with oxycodone and Toradol with complete resolution of her symptoms.  We discussed acupuncture and massage and stretching and I have encouraged her to stop the steroids.  Strict return precautions have been given.      ____________________________________________   FINAL CLINICAL IMPRESSION(S) / ED DIAGNOSES  Final diagnoses:  Strain of lumbar region, initial encounter      NEW MEDICATIONS STARTED DURING THIS VISIT:  Discharge Medication List as of 07/23/2018  1:35 AM    START taking these medications   Details  oxyCODONE-acetaminophen (PERCOCET/ROXICET) 5-325 MG  tablet Take 1 tablet by mouth every 4 (four) hours as needed for severe pain., Starting Sat 07/23/2018, Print         Note:  This document was prepared using Dragon voice recognition software and may include unintentional dictation errors.     Merrily Brittleifenbark, Andrue Dini, MD 07/24/18 Cleophas [id number]

## 2018-07-22 NOTE — ED Notes (Signed)
Urine pregnancy negative, xray notified.

## 2018-07-23 LAB — URINALYSIS, COMPLETE (UACMP) WITH MICROSCOPIC
Bilirubin Urine: NEGATIVE
Glucose, UA: NEGATIVE mg/dL
HGB URINE DIPSTICK: NEGATIVE
Ketones, ur: NEGATIVE mg/dL
NITRITE: NEGATIVE
PH: 6 (ref 5.0–8.0)
Protein, ur: NEGATIVE mg/dL
SPECIFIC GRAVITY, URINE: 1.029 (ref 1.005–1.030)

## 2018-07-23 LAB — POCT PREGNANCY, URINE: PREG TEST UR: NEGATIVE

## 2018-07-23 MED ORDER — OXYCODONE-ACETAMINOPHEN 5-325 MG PO TABS
2.0000 | ORAL_TABLET | Freq: Once | ORAL | Status: AC
  Administered 2018-07-23: 2 via ORAL
  Filled 2018-07-23: qty 2

## 2018-07-23 MED ORDER — OXYCODONE-ACETAMINOPHEN 5-325 MG PO TABS: 1.0000 | ORAL_TABLET | ORAL | 0 refills | Status: DC | PRN

## 2018-07-23 MED ORDER — LIDOCAINE HCL (PF) 1 % IJ SOLN
5.0000 mL | Freq: Once | INTRAMUSCULAR | Status: AC
  Administered 2018-07-23: 5 mL via INTRADERMAL
  Filled 2018-07-23: qty 5

## 2018-07-23 MED ORDER — KETOROLAC TROMETHAMINE 30 MG/ML IJ SOLN
30.0000 mg | Freq: Once | INTRAMUSCULAR | Status: AC
Start: 2018-07-23 — End: 2018-07-23
  Administered 2018-07-23: 30 mg via INTRAMUSCULAR
  Filled 2018-07-23: qty 1

## 2018-07-23 NOTE — Discharge Instructions (Signed)
Please stop taking your prednisone as it will not really help your back.  Take your pain medication as needed for severe symptoms and make sure you use plenty of hot showers, hot baths, and massage to help with your pain.  Return to the emergency department for any concerns.  It was a pleasure to take care of you today, and thank you for coming to our emergency department.  If you have any questions or concerns before leaving please ask the nurse to grab me and I'm more than happy to go through your aftercare instructions again.  If you were prescribed any opioid pain medication today such as Norco, Vicodin, Percocet, morphine, hydrocodone, or oxycodone please make sure you do not drive when you are taking this medication as it can alter your ability to drive safely.  If you have any concerns once you are home that you are not improving or are in fact getting worse before you can make it to your follow-up appointment, please do not hesitate to call 911 and come back for further evaluation.  Merrily Brittle, MD  Results for orders placed or performed during the hospital encounter of 07/22/18  Pregnancy, urine POC  Result Value Ref Range   Preg Test, Ur NEGATIVE NEGATIVE   Dg Lumbar Spine Complete  Result Date: 07/22/2018 CLINICAL DATA:  Low back pain EXAM: LUMBAR SPINE - COMPLETE 4+ VIEW COMPARISON:  None. FINDINGS: There is no evidence of lumbar spine fracture. Mild leftward curvature of the lumbar spine. Intervertebral disc spaces are maintained. IMPRESSION: No acute osseous abnormality Electronically Signed   By: Jasmine Pang M.D.   On: 07/22/2018 23:34   While here in the ER today you received very powerful medicine that makes it unsafe for you to drive for the rest of the day.  Do not drive until tomorrow.

## 2018-07-29 ENCOUNTER — Other Ambulatory Visit: Payer: Self-pay | Admitting: Physical Medicine and Rehabilitation

## 2018-07-29 ENCOUNTER — Other Ambulatory Visit: Payer: Self-pay

## 2018-07-29 DIAGNOSIS — M5416 Radiculopathy, lumbar region: Secondary | ICD-10-CM

## 2018-08-11 ENCOUNTER — Ambulatory Visit
Admission: RE | Admit: 2018-08-11 | Discharge: 2018-08-11 | Disposition: A | Discharge status: Home / Self Care | Payer: BLUE CROSS/BLUE SHIELD | Source: Ambulatory Visit | Attending: Physical Medicine and Rehabilitation | Admitting: Physical Medicine and Rehabilitation

## 2018-08-11 DIAGNOSIS — M5416 Radiculopathy, lumbar region: Secondary | ICD-10-CM | POA: Diagnosis not present

## 2019-01-05 ENCOUNTER — Telehealth: Payer: Self-pay

## 2019-01-05 DIAGNOSIS — Z20822 Contact with and (suspected) exposure to covid-19: Secondary | ICD-10-CM

## 2019-01-05 NOTE — Telephone Encounter (Signed)
Olivia Cornelia, RN at [facility] Department called to refer the patient for covid testing. Patient called, no answer, no voicemail set up, recording to try your call again later.

## 2019-01-06 NOTE — Addendum Note (Signed)
Addended by: Benson Setting L on: 01/06/2019 03:08 PM   Modules accepted: Orders

## 2019-01-06 NOTE — Telephone Encounter (Signed)
Attempted to call patient to schedule testing- [employer] customer not available recording- unable to leave message.

## 2019-01-06 NOTE — Telephone Encounter (Signed)
Contacted pt to schedule. Pt stated due to her going out of town the best day that will work for her is Wednesday. Pt was scheduled at the [facility] building at 10:45. Pt is aware to remain in her car and to wear a mask. Pt understood and had no additional questions at this time. Nothing further is needed  Order was placed.

## 2019-01-11 ENCOUNTER — Other Ambulatory Visit: Payer: BLUE CROSS/BLUE SHIELD

## 2019-01-11 DIAGNOSIS — Z20822 Contact with and (suspected) exposure to covid-19: Secondary | ICD-10-CM

## 2019-11-21 ENCOUNTER — Ambulatory Visit: Payer: Medicaid Other | Attending: Internal Medicine

## 2019-11-21 DIAGNOSIS — Z23 Encounter for immunization: Secondary | ICD-10-CM

## 2019-11-21 NOTE — Progress Notes (Signed)
   Covid-19 Vaccination Clinic  Name:  TONIQUE MENDONCA    MRN: [id number] DOB: October 24, 1990  11/21/2019  Ms. Kreiter was observed post Covid-19 immunization for 15 minutes without incident. She was provided with Vaccine Information Sheet and instruction to access the V-Safe system.   Ms. Bega was instructed to call 911 with any severe reactions post vaccine: Marland Kitchen Difficulty breathing  . Swelling of face and throat  . A fast heartbeat  . A bad rash all over body  . Dizziness and weakness   Immunizations Administered    Name Date Dose VIS Date Route   Pfizer COVID-19 Vaccine 11/21/2019 12:18 PM 0.3 mL 08/23/2018 Intramuscular   Manufacturer: [employer], [employer]   Lot: [id number]   NDC: [address]-1

## 2019-12-12 ENCOUNTER — Ambulatory Visit: Payer: Medicaid Other | Attending: Internal Medicine

## 2019-12-12 DIAGNOSIS — Z23 Encounter for immunization: Secondary | ICD-10-CM

## 2019-12-12 NOTE — Progress Notes (Signed)
   Covid-19 Vaccination Clinic  Name:  Olivia Frost    MRN: [id number] DOB: [birth year]  12/12/2019  Olivia Frost was observed post Covid-19 immunization for 15 minutes without incident. She was provided with Vaccine Information Sheet and instruction to access the V-Safe system.   Olivia Frost was instructed to call 911 with any severe reactions post vaccine: Marland Kitchen Difficulty breathing  . Swelling of face and throat  . A fast heartbeat  . A bad rash all over body  . Dizziness and weakness   Immunizations Administered    Name Date Dose VIS Date Route   Pfizer COVID-19 Vaccine 12/12/2019  1:15 PM 0.3 mL 08/23/2018 Intramuscular   Manufacturer: [employer], [employer]   Lot: [id number]   NDC: [address]-0

## 2020-02-05 IMAGING — CR DG LUMBAR SPINE COMPLETE 4+V
5 series · 5 of 5 positions shown · non-contrast
Comparison: None.

CLINICAL DATA: Low back pain

EXAM:
LUMBAR SPINE - COMPLETE 4+ VIEW

[l-spine ap]
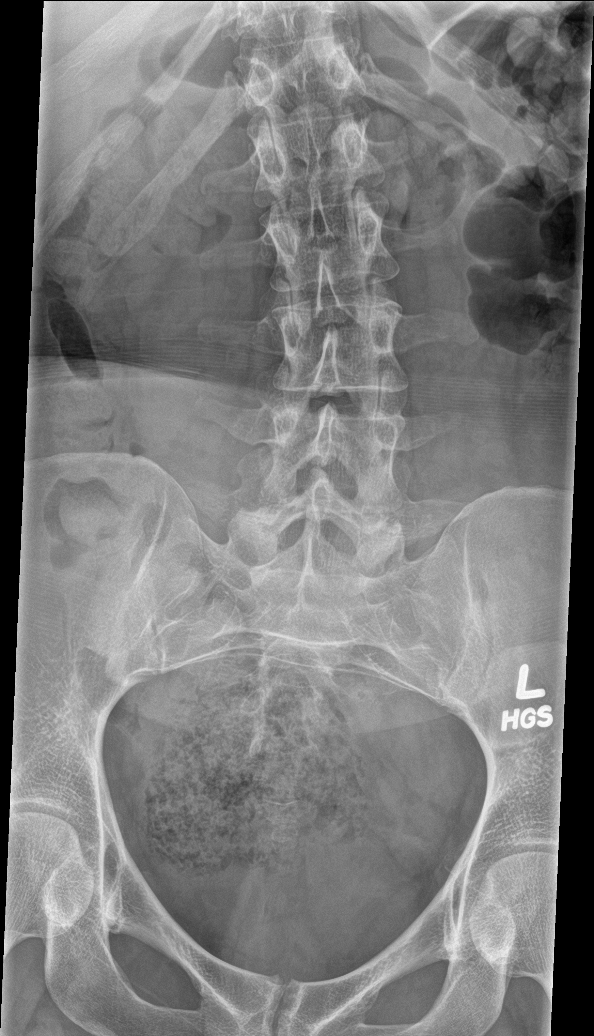

[l-spine obl (1 of 2)]
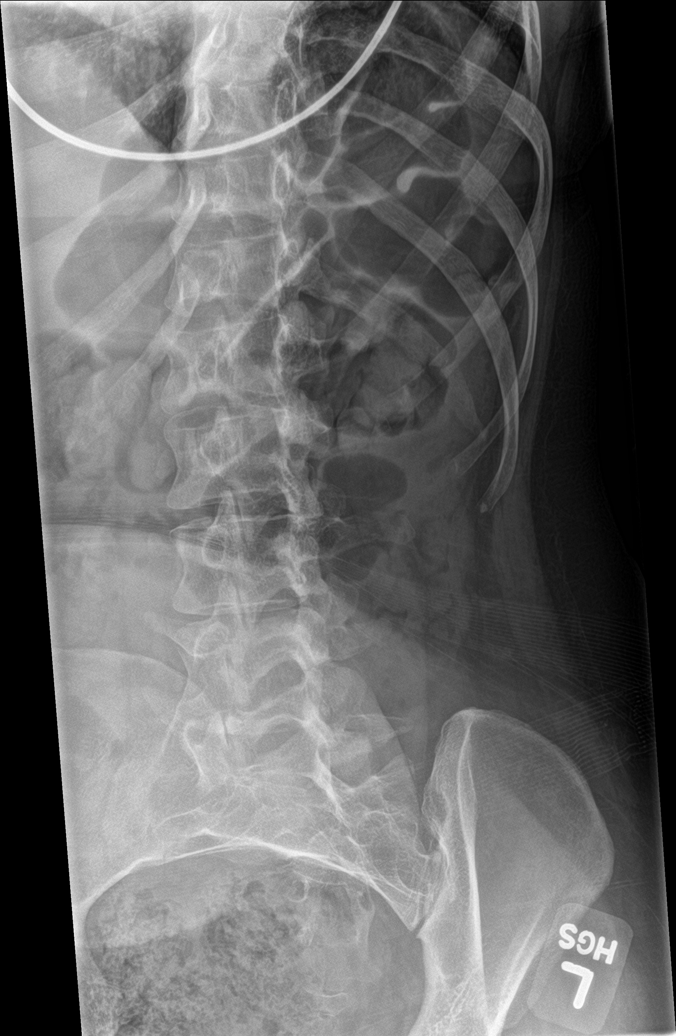

[l-spine obl (2 of 2)]
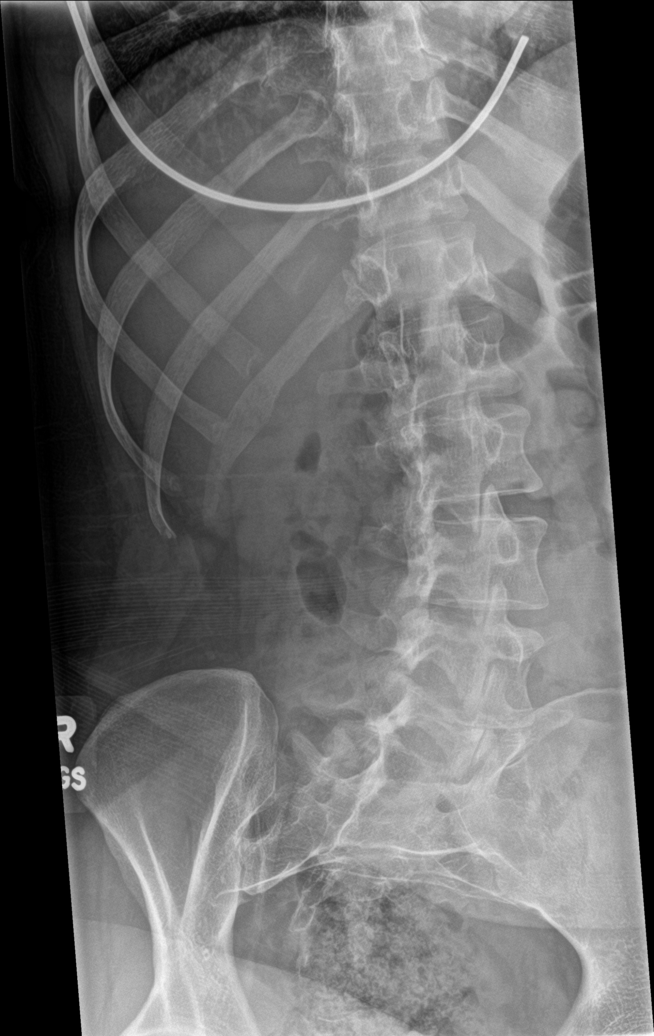

[l-spine lat]
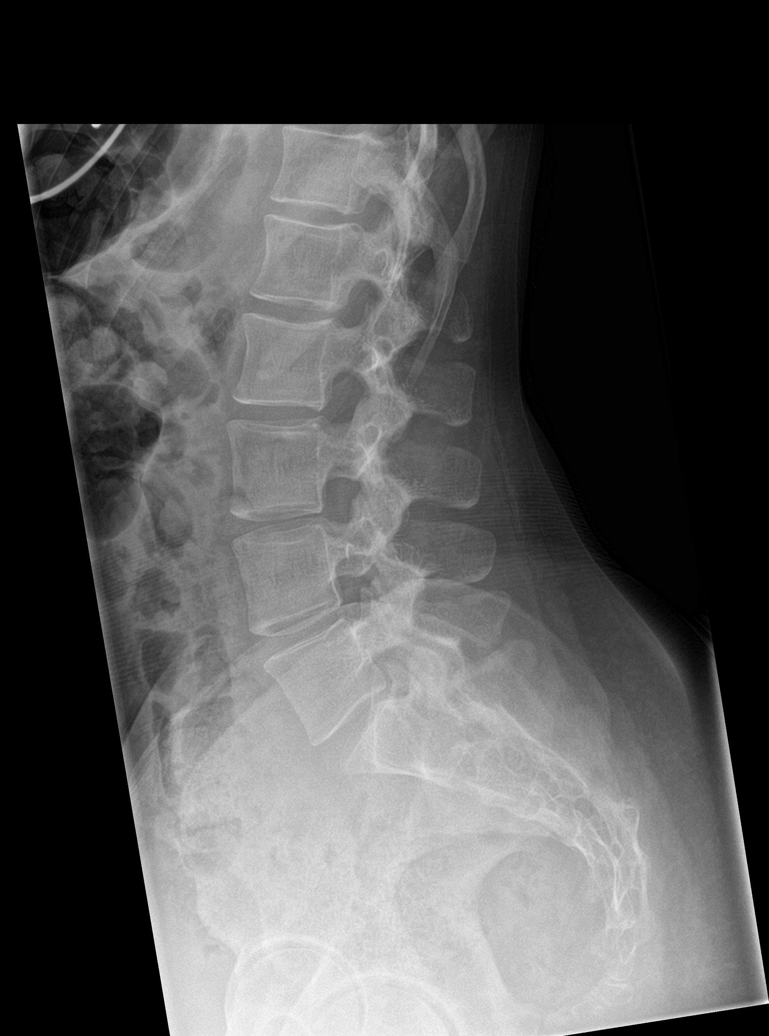

[l-spine spot]
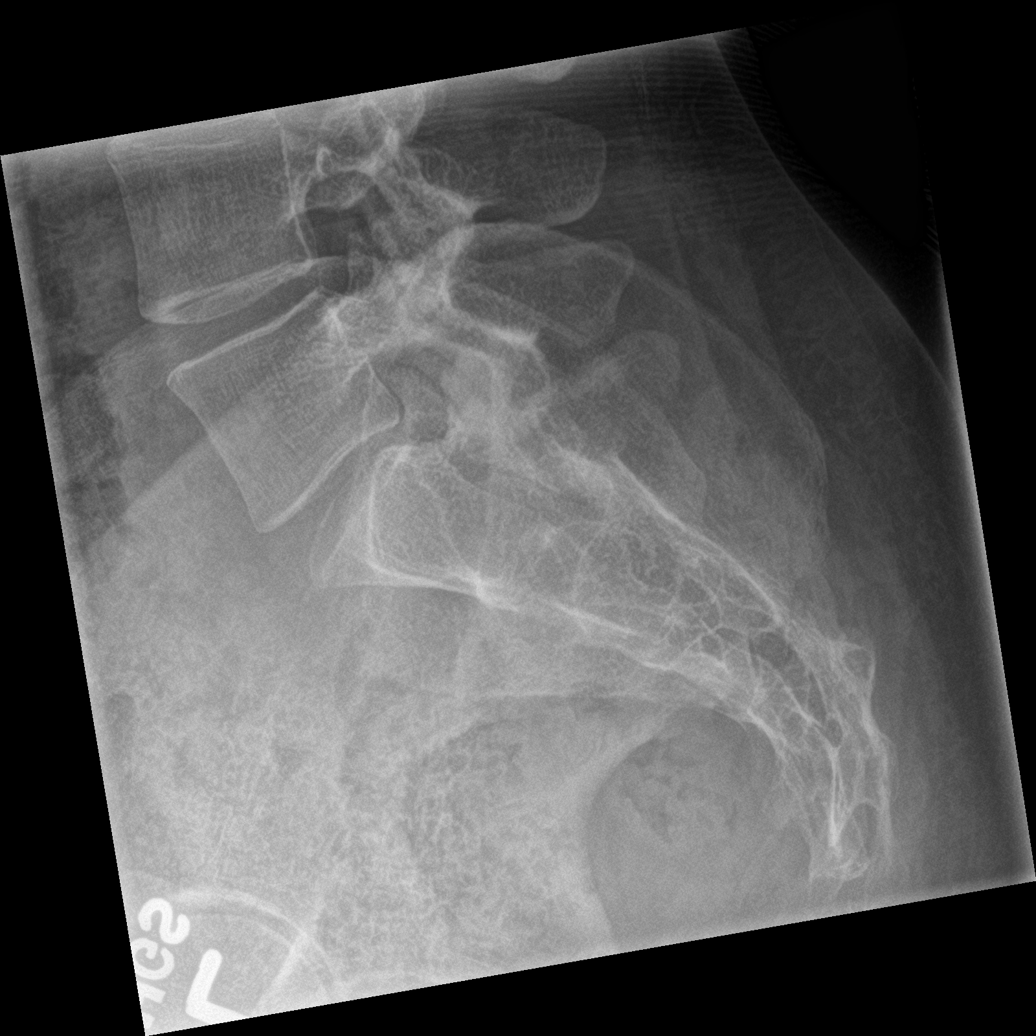

[5 of 5 positions shown; findings below may reference images not displayed]

FINDINGS: There is no evidence of lumbar spine fracture. Mild leftward
curvature of the lumbar spine. Intervertebral disc spaces are
maintained.
IMPRESSION: No acute osseous abnormality

## 2021-02-13 ENCOUNTER — Other Ambulatory Visit (HOSPITAL_COMMUNITY)
Admission: RE | Admit: 2021-02-13 | Discharge: 2021-02-13 | Disposition: A | Discharge status: Home / Self Care | Payer: Medicaid Other | Source: Ambulatory Visit | Attending: Obstetrics | Admitting: Obstetrics

## 2021-02-13 ENCOUNTER — Ambulatory Visit (INDEPENDENT_AMBULATORY_CARE_PROVIDER_SITE_OTHER): Payer: Medicaid Other | Admitting: Obstetrics

## 2021-02-13 ENCOUNTER — Other Ambulatory Visit: Payer: Self-pay | Admitting: Obstetrics

## 2021-02-13 ENCOUNTER — Encounter: Payer: Self-pay | Admitting: Obstetrics

## 2021-02-13 ENCOUNTER — Other Ambulatory Visit: Payer: Self-pay

## 2021-02-13 VITALS — BP 120/80 | Ht 68.0 in | Wt 214.0 lb

## 2021-02-13 DIAGNOSIS — Z01419 Encounter for gynecological examination (general) (routine) without abnormal findings: Secondary | ICD-10-CM | POA: Diagnosis not present

## 2021-02-13 DIAGNOSIS — Z124 Encounter for screening for malignant neoplasm of cervix: Secondary | ICD-10-CM

## 2021-02-13 DIAGNOSIS — Z113 Encounter for screening for infections with a predominantly sexual mode of transmission: Secondary | ICD-10-CM

## 2021-02-13 DIAGNOSIS — Z975 Presence of (intrauterine) contraceptive device: Secondary | ICD-10-CM | POA: Insufficient documentation

## 2021-02-13 DIAGNOSIS — Z0000 Encounter for general adult medical examination without abnormal findings: Secondary | ICD-10-CM | POA: Diagnosis not present

## 2021-02-13 NOTE — Addendum Note (Signed)
Addended by: Mirna Mires on: 02/13/2021 01:58 PM   Modules accepted: Orders

## 2021-02-13 NOTE — Progress Notes (Signed)
Gynecology Annual Exam  PCP: Patient, No Pcp Per (Inactive)  Chief Complaint:  Chief Complaint  Patient presents with   Annual Exam   Vaginal Discharge    Odor, yellow    History of Present Illness:  Ms. Olivia Frost is a 30 y.o. G2P1011 who LMP was No LMP recorded., presents today for her annual examination. She is single and partnered, and has a degree in Office manager. She is working for an Scientist, [employer]. Her menses are irregular as she has a Nexplanon in place., lasting 7 day(s).  Dysmenorrhea none. She does not have intermenstrual bleeding. Her Nexplanon was placed in 2019, and she cannot remember which arm was the placement. She is interested in another pregnancy and also desires some STI screening. She has had two Nexplanons; one placed in 2016, and then the second one placed , she believes, in 2019. She plans on having the Nexplanon removed today.  She is single partner, contraception - Nexplanon.  Last Pap: cannot remember.  Results were: no abnormalities /per her report. Hx of STDs: none  There is no FH of breast cancer. There is no FH of ovarian cancer. The patient does do self-breast exams.  Tobacco use: The patient denies current or previous tobacco use. Alcohol use: social drinker Exercise: not active    The patient wears seatbelts: yes.   The patient reports that domestic violence in her life is absent.   No past medical history on file.  Past Surgical History:  Procedure Laterality Date   WRIST SURGERY Left     Prior to Admission medications   Medication Sig Start Date End Date Taking? Authorizing Provider  etonogestrel (NEXPLANON) 68 MG IMPL implant Inject into the skin.   Yes [provider]  ferrous fumarate (HEMOCYTE - 106 MG FE) 325 (106 FE) MG TABS tablet Take 1 tablet by mouth. Patient not taking: Reported on 02/13/2021    [provider]  ibuprofen (ADVIL,MOTRIN) 600 MG tablet Take 1 tablet (600 mg total) by mouth  every 6 (six) hours. Patient not taking: Reported on 02/13/2021 02/02/15   Christeen Douglas, MD  lanolin OINT Apply 1 application topically as needed (for breast care). Patient not taking: Reported on 02/13/2021 02/02/15   Christeen Douglas, MD  oxyCODONE-acetaminophen (PERCOCET/ROXICET) 5-325 MG tablet Take 1 tablet by mouth every 4 (four) hours as needed for severe pain. Patient not taking: Reported on 02/13/2021 07/23/18   Merrily Brittle, MD  Prenatal Vit-Fe Fumarate-FA (PRENATAL MULTIVITAMIN) TABS tablet Take 1 tablet by mouth daily at 12 noon. Patient not taking: Reported on 02/13/2021    [provider]    No Known Allergies  Gynecologic History: No LMP recorded. History of abnormal pap smear: No History of STI: No   Obstetric History: G2P1011  Social History   Socioeconomic History   Marital status: Single    Spouse name: Not on file   Number of children: Not on file   Years of education: Not on file   Highest education level: Not on file  Occupational History   Not on file  Tobacco Use   Smoking status: Never   Smokeless tobacco: Never  Substance and Sexual Activity   Alcohol use: Yes   Drug use: No   Sexual activity: Yes  Other Topics Concern   Not on file  Social History Narrative   Not on file   Social Determinants of Health   Financial Resource Strain: Not on file  Food Insecurity: Not on file  Transportation Needs: Not on file  Physical Activity: Not on file  Stress: Not on file  Social Connections: Not on file  Intimate Partner Violence: Not on file    Family History  Problem Relation Age of Onset   Breast cancer Maternal Grandmother    Breast cancer Paternal Great-grandmother    Lung cancer Paternal Great-grandmother     Review of Systems  Constitutional: Negative.   HENT: Negative.    Eyes: Negative.   Respiratory: Negative.    Cardiovascular: Negative.   Gastrointestinal: Negative.   Genitourinary: Negative.   Musculoskeletal:  Negative.   Skin: Negative.   Neurological: Negative.   Endo/Heme/Allergies: Negative.   Psychiatric/Behavioral: Negative.      Physical Exam BP 120/80   Ht 5\' 8"  (1.727 m)   Wt 214 lb (97.1 kg)   BMI 32.54 kg/m    Physical Exam Constitutional:      Appearance: Normal appearance. She is obese.  Genitourinary:     Vulva and rectum normal.     Genitourinary Comments: No rashes  or lesions noted. Normal female genitalia. Uterus is anteverted, non enlarged. No adnexal tenderness noted.  HENT:     Head: Normocephalic and atraumatic.     Nose: Nose normal.  Cardiovascular:     Rate and Rhythm: Normal rate and regular rhythm.     Pulses: Normal pulses.     Heart sounds: Normal heart sounds.  Pulmonary:     Effort: Pulmonary effort is normal.     Breath sounds: Normal breath sounds.  Abdominal:     Palpations: Abdomen is soft.     Comments: Adipose noted.  Musculoskeletal:        General: Normal range of motion.     Cervical back: Normal range of motion and neck supple.  Neurological:     General: No focal deficit present.     Mental Status: She is alert and oriented to person, place, and time.  Skin:    General: Skin is warm and dry.  Psychiatric:        Mood and Affect: Mood normal.        Behavior: Behavior normal.  Arm inspected to locate Nexplanon implant- No palpable implant found or palpated on either arm-Careful inspection performed with no implant found.  Female chaperone present for pelvic and breast  portions of the physical exam       Assessment: 30 y.o. G4P1011 female here for routine annual gynecologic examination For Nexplanon removal- No implant palpable/located  Plan: Problem List Items Addressed This Visit   None   Screening: -- Blood pressure screen normal -- Weight screening: overweight: continue to monitor -- Depression screening negative (PHQ-9) -- Nutrition: normal -- cholesterol screening: not due for screening -- osteoporosis  screening: not due -- tobacco screening: not using -- alcohol screening: AUDIT questionnaire indicates low-risk usage. -- family history of breast cancer screening: done. not at high risk. -- no evidence of domestic violence or intimate partner violence. -- STD screening: gonorrhea/chlamydia NAAT collected -- pap smear collected per ASCCP guidelines -- flu vaccine  declines -- HPV vaccination series:  uncertain whether she received this  Discussed the "lost" Nexplanon- will order either x ray or sono to locate her Nexplanon, and will likely need a surgical referral for removal.   G1P0, CNM  02/13/2021 12:18 PM   02/13/2021 11:25 AM

## 2021-02-14 LAB — CYTOLOGY - PAP
Chlamydia: NEGATIVE
Comment: NEGATIVE
Comment: NEGATIVE
Comment: NORMAL
Diagnosis: NEGATIVE
Neisseria Gonorrhea: NEGATIVE
Trichomonas: NEGATIVE

## 2021-02-18 ENCOUNTER — Telehealth: Payer: Self-pay

## 2021-02-18 NOTE — Telephone Encounter (Signed)
Patient is calling to follow up on an Xray for the location of were her nexplanon is. Please advise no order is placed.

## 2021-02-19 ENCOUNTER — Other Ambulatory Visit: Payer: Self-pay | Admitting: Obstetrics

## 2021-02-19 DIAGNOSIS — Z975 Presence of (intrauterine) contraceptive device: Secondary | ICD-10-CM

## 2021-02-19 NOTE — Telephone Encounter (Signed)
Olivia Frost, could you look into this and let me know if I need to contact patient with scheduling?

## 2021-02-21 ENCOUNTER — Encounter: Payer: Self-pay | Admitting: Obstetrics

## 2021-02-28 ENCOUNTER — Ambulatory Visit
Admission: RE | Admit: 2021-02-28 | Discharge: 2021-02-28 | Disposition: A | Discharge status: Home / Self Care | Payer: Medicaid Other | Source: Ambulatory Visit | Attending: Obstetrics | Admitting: Obstetrics

## 2021-02-28 ENCOUNTER — Other Ambulatory Visit: Payer: Self-pay

## 2021-02-28 DIAGNOSIS — Z975 Presence of (intrauterine) contraceptive device: Secondary | ICD-10-CM | POA: Diagnosis not present

## 2021-03-06 ENCOUNTER — Other Ambulatory Visit: Payer: Self-pay

## 2021-03-06 ENCOUNTER — Ambulatory Visit (INDEPENDENT_AMBULATORY_CARE_PROVIDER_SITE_OTHER): Payer: Medicaid Other | Admitting: Obstetrics

## 2021-03-06 VITALS — Wt 214.0 lb

## 2021-03-06 DIAGNOSIS — Z3046 Encounter for surveillance of implantable subdermal contraceptive: Secondary | ICD-10-CM | POA: Diagnosis not present

## 2021-03-06 DIAGNOSIS — Z975 Presence of (intrauterine) contraceptive device: Secondary | ICD-10-CM

## 2021-03-06 DIAGNOSIS — Z308 Encounter for other contraceptive management: Secondary | ICD-10-CM

## 2021-03-06 NOTE — Progress Notes (Signed)
Virtual Visit via Telephone Note  I connected with Nandini Olam Idler on 03/06/21 at 11:10 AM EDT by telephone and verified that I am speaking with the correct person using two identifiers.   I discussed the limitations, risks, security and privacy concerns of performing an evaluation and management service by telephone and the availability of in person appointments. I also discussed with the patient that there may be a patient responsible charge related to this service. The patient expressed understanding and agreed to proceed.  The patient was at home I spoke with the patient from my  phone at the office The names of people involved in this encounter were: Paula Compton CNM , and Izabel Severt.  History of Present Illness: The patient recently came in for Nexplanon removal and her annual physical. At that time we were unable to visualize or locate her Nexplanon. She was referred to ultrasound in order to locate her Nexplanon.   Observations/Objective:  Physical Exam could not be performed. Because of the COVID-19 outbreak this visit was performed over the phone and not in person.   Assessment and Plan: "Missing Nexplanon.- Located via ultrasound (see report).  Will require surgical removal.  Follow Up Instructions: Per the ultrasound report, the Nexplanon is located in her left arm and is 6 mm deep.  She needs referral to a general surgeon to discuss removal.   I discussed the assessment and treatment plan with the patient. The patient was provided an opportunity to ask questions and all were answered. The patient agreed with the plan and demonstrated an understanding of the instructions.   The patient was advised to call back or seek an in-person evaluation if the symptoms worsen or if the condition fails to improve as anticipated.  I provided 20 minutes of non-face-to-face time during this encounter.  Mirna Mires, CNM  03/06/2021 12:55 PM   [facility], [facility] 03/06/2021 12:45 PM

## 2021-08-25 ENCOUNTER — Other Ambulatory Visit: Payer: Self-pay

## 2021-08-25 ENCOUNTER — Encounter: Payer: Self-pay | Admitting: Emergency Medicine

## 2021-08-25 ENCOUNTER — Ambulatory Visit
Admission: EM | Admit: 2021-08-25 | Discharge: 2021-08-25 | Disposition: A | Discharge status: Home / Self Care | Payer: 59 | Attending: Internal Medicine | Admitting: Internal Medicine

## 2021-08-25 DIAGNOSIS — R519 Headache, unspecified: Secondary | ICD-10-CM

## 2021-08-25 MED ORDER — PREDNISONE 20 MG PO TABS: 20.0000 mg | ORAL_TABLET | Freq: Every day | ORAL | 0 refills | Status: DC

## 2021-08-25 NOTE — ED Provider Notes (Signed)
[facility]    CSN: [id number] Arrival date & time: 08/25/21  1018      History   Chief Complaint Chief Complaint  Patient presents with   Headache   Cyst    HPI Olivia Frost is a 31 y.o. female who presents with a lump on L top scalp since last night at whic time she was having intermittent sharp pains on this area. Has hx of recurring sharp shooting pains in this area x 5 months.     History reviewed. No pertinent past medical history.  Patient Active Problem List   Diagnosis Date Noted   Nexplanon in place 02/13/2021   Normal labor 01/31/2015   Abdominal pain affecting pregnancy 01/11/2015    Past Surgical History:  Procedure Laterality Date   WRIST SURGERY Left     OB History     Gravida  2   Para  1   Term  1   Preterm  0   AB  1   Living  1      SAB      IAB      Ectopic      Multiple  0   Live Births  1            Home Medications    Prior to Admission medications   Medication Sig Start Date End Date Taking? Authorizing Provider  predniSONE (DELTASONE) 20 MG tablet Take 1 tablet (20 mg total) by mouth daily with breakfast. 08/25/21  Yes Rodriguez-Southworth, Sunday Spillers, PA-C  etonogestrel (NEXPLANON) 68 MG IMPL implant Inject into the skin.    [provider]    Family History Family History  Problem Relation Age of Onset   Breast cancer Maternal Grandmother    Breast cancer Paternal Great-grandmother    Lung cancer Paternal Great-grandmother     Social History Social History   Tobacco Use   Smoking status: Never   Smokeless tobacco: Never  Substance Use Topics   Alcohol use: Yes   Drug use: No     Allergies   Patient has no known allergies.   Review of Systems Review of Systems  Constitutional:  Negative for chills and fever.  HENT:  Negative for congestion.   Eyes:  Negative for photophobia and visual disturbance.  Respiratory:  Negative for cough.   Gastrointestinal:  Negative for  nausea.  Musculoskeletal:  Negative for myalgias.  Skin:  Negative for rash and wound.  Neurological:  Positive for headaches. Negative for dizziness and weakness.  Hematological:  Negative for adenopathy.    Physical Exam Triage Vital Signs ED Triage Vitals  Enc Vitals Group     BP 08/25/21 1041 105/62     Pulse Rate 08/25/21 1041 90     Resp 08/25/21 1041 18     Temp 08/25/21 1041 98.8 F (37.1 C)     Temp Source 08/25/21 1041 Oral     SpO2 08/25/21 1041 98 %     Weight --      Height --      Head Circumference --      Peak Flow --      Pain Score 08/25/21 1043 8     Pain Loc --      Pain Edu? --      Excl. in [place]? --    No data found.  Updated Vital Signs BP 105/62    Pulse 90    Temp 98.8 F (37.1 C) (Oral)  Resp 18    SpO2 98%   Visual Acuity Right Eye Distance:   Left Eye Distance:   Bilateral Distance:    Right Eye Near:   Left Eye Near:    Bilateral Near:      Physical Exam Vitals signs and nursing note reviewed.  Constitutional:      General: He is not in acute distress.    Appearance: He is well-developed and normal weight. He is not ill-appearing, toxic-appearing or diaphoretic.  HENT:     Head: are where she felt the lump looks normal and but is moderately tender to palpation, but there is no lumps or redness, this tenderness does not  go any further.  TM's gray and shiny, ear canals  and external ears are normal.  Eyes:     Extraocular Movements: Extraocular movements intact.     Pupils: Pupils are equal, round, and reactive to light.  Neck:     Musculoskeletal: Neck supple. No neck rigidity.     Meningeal: Brudzinski's sign absent.  Cardiovascular:     Rate and Rhythm: Normal rate and regular rhythm.     Heart sounds: No murmur.  Pulmonary:     Effort: Pulmonary effort is normal.     Breath sounds: Normal breath sounds. No wheezing, rhonchi or rales.  Abdominal:     General: Bowel sounds are normal.     Palpations: Abdomen is soft. There  is no mass.     Tenderness: There is no abdominal tenderness. There is no guarding.  Musculoskeletal: Normal range of motion.  Lymphadenopathy:     Cervical: No cervical adenopathy.  Skin:    General: Skin is warm and dry.  Neurological:     Mental Status: He is alert.     Cranial Nerves: No cranial nerve deficit or facial asymmetry.     Sensory: No sensory deficit.     Motor: No weakness.     Coordination: Romberg sign negative. Coordination normal.     Gait: Gait normal.     Deep Tendon Reflexes: Reflexes normal.     Comments: Normal Romberg, finger to nose, tandem gait.   Strength 5/5 on upper and lower extremities  Psychiatric:        Mood and Affect: Mood normal.        Speech: Speech normal.        Behavior: Behavior normal.     UC Treatments / Results  Labs (all labs ordered are listed, but only abnormal results are displayed) Labs Reviewed - No data to display  EKG   Radiology No results found.  Procedures Procedures (including critical care time)  Medications Ordered in UC Medications - No data to display  Initial Impression / Assessment and Plan / UC Course  I have reviewed the triage vital signs and the nursing notes. Cephalgia of unknown cause, I wonder if there is a neuritis component. Told to watch out for rash since shingles may start this way I placed her on Prednisone as noted. See instructions.  Final Clinical Impressions(s) / UC Diagnoses   Final diagnoses:  Scalp pain  Nonintractable episodic headache, unspecified headache type     Discharge Instructions      Go yo the ER if the stabbing pain comes back and the medication does not help it resolve in  the next 48 h      ED Prescriptions     Medication Sig Dispense Auth. Provider   predniSONE (DELTASONE) 20 MG tablet Take 1 tablet (  20 mg total) by mouth daily with breakfast. 5 tablet Rodriguez-Southworth, Sunday Spillers, PA-C      PDMP not reviewed this encounter.   Shelby Mattocks, PA-C 08/25/21 1216

## 2021-08-25 NOTE — Discharge Instructions (Signed)
Go yo the ER if the stabbing pain comes back and the medication does not help it resolve in  the next 48 h

## 2021-08-25 NOTE — ED Triage Notes (Signed)
Pt here with knot on the left rear of scalp that was noticed yesterday following 5 months of recurring sharp, shooting pain in that area.

## 2022-03-20 ENCOUNTER — Ambulatory Visit (INDEPENDENT_AMBULATORY_CARE_PROVIDER_SITE_OTHER): Payer: Medicaid Other

## 2022-03-20 VITALS — BP 100/60 | Ht 68.0 in | Wt 214.0 lb

## 2022-03-20 DIAGNOSIS — N912 Amenorrhea, unspecified: Secondary | ICD-10-CM

## 2022-03-20 LAB — POCT URINE PREGNANCY: Preg Test, Ur: POSITIVE — AB

## 2022-03-20 NOTE — Progress Notes (Signed)
Subjective:    Olivia Frost is a 31 y.o. female who presents for evaluation of amenorrhea. She believes she could be pregnant. Pregnancy is desired.  Last period was abnormal.   No LMP recorded.  Lab Review Urine HCG: positive    Assessment:    Absence of menstruation.     Plan:    Pregnancy Test:  Positive: EDC: 11/26/2022. Briefly discussed positive results and sent to check out for scheduling for New OB appointments.

## 2022-03-26 DIAGNOSIS — U071 COVID-19: Secondary | ICD-10-CM

## 2022-03-26 HISTORY — DX: COVID-19: U07.1

## 2022-03-30 ENCOUNTER — Telehealth: Payer: Self-pay

## 2022-03-30 DIAGNOSIS — O219 Vomiting of pregnancy, unspecified: Secondary | ICD-10-CM

## 2022-03-30 MED ORDER — DOXYLAMINE-PYRIDOXINE 10-10 MG PO TBEC: 2.0000 | DELAYED_RELEASE_TABLET | Freq: Every day | ORAL | 5 refills | Status: DC

## 2022-03-30 NOTE — Telephone Encounter (Signed)
Pt aware.

## 2022-03-30 NOTE — Telephone Encounter (Signed)
Pt calling triage needing something for a cough. I have went over the OTC medicines with her she was asking for a RX. States she had Covid in September and now she thinks its bronchitis,She has came in for her confirmation appointment but not a nob yet. She also asked for Nausea meds, I will send in Diclegis to see if that helps

## 2022-04-22 ENCOUNTER — Ambulatory Visit (INDEPENDENT_AMBULATORY_CARE_PROVIDER_SITE_OTHER): Payer: Self-pay

## 2022-04-22 ENCOUNTER — Ambulatory Visit: Payer: Medicaid Other

## 2022-04-22 VITALS — Wt 215.0 lb

## 2022-04-22 DIAGNOSIS — Z3480 Encounter for supervision of other normal pregnancy, unspecified trimester: Secondary | ICD-10-CM

## 2022-04-22 DIAGNOSIS — Z369 Encounter for antenatal screening, unspecified: Secondary | ICD-10-CM

## 2022-04-22 DIAGNOSIS — Z3689 Encounter for other specified antenatal screening: Secondary | ICD-10-CM

## 2022-04-22 HISTORY — DX: Encounter for supervision of other normal pregnancy, unspecified trimester: Z34.80

## 2022-04-22 NOTE — Progress Notes (Unsigned)
New OB Intake  I connected with  Olivia Frost on 04/22/22 at  2:15 PM EDT by telephone and verified that I am speaking with the correct person using two identifiers. Nurse is located at [facility] and pt is located at home.  I explained I am completing New OB Intake today. We discussed her EDD of 11/26/2022 that is based on LMP of approx 02/19/2022. Pt is G3/P1011. I reviewed her allergies, medications, Medical/Surgical/OB history, and appropriate screenings. Based on history, this is a/an pregnancy uncomplicated .   Patient Active Problem List   Diagnosis Date Noted   Nexplanon in place 02/13/2021   Normal labor 01/31/2015   Abdominal pain affecting pregnancy 01/11/2015    Concerns addressed today Pt thinks she is farther along than her dates b/c her stomach is hard at the top and her morning sickness is subsiding; she hasn't started diclegis but has it.  Delivery Plans:  Plans to deliver at [facility] or [place].  Adv if goes to [facility] she will have someone deliver her that she hasn't meet.  Anatomy US Explained first scheduled [place] will be scheduled soon and an anatomy scan will be done at 20 weeks.  Labs Discussed genetic screening with patient. Patient desires genetic testing to be drawn at or after 10 weeks. Discussed possible labs to be drawn at new OB appointment.  COVID Vaccine Patient has had COVID vaccine.   Social Determinants of Health Food Insecurity: denies food insecurity Transportation: Patient denies transportation needs. Childcare: Discussed no children allowed at ultrasound appointments.   First visit review I reviewed new OB appt with pt. I explained she will have ob bloodwork and pap smear/pelvic exam if indicated. Explained pt will be seen by Dr. Jeannie Fend at first visit; encounter routed to appropriate provider.   Cleophas Dunker, [place] 04/22/2022  2:42 PM

## 2022-04-22 NOTE — Progress Notes (Signed)
This pt is scheduled for NOB appt with Dr. Amalia Hailey.

## 2022-04-27 ENCOUNTER — Other Ambulatory Visit: Payer: Self-pay

## 2022-04-27 DIAGNOSIS — Z3480 Encounter for supervision of other normal pregnancy, unspecified trimester: Secondary | ICD-10-CM

## 2022-04-27 DIAGNOSIS — Z3A09 9 weeks gestation of pregnancy: Secondary | ICD-10-CM

## 2022-04-27 DIAGNOSIS — Z113 Encounter for screening for infections with a predominantly sexual mode of transmission: Secondary | ICD-10-CM

## 2022-04-28 ENCOUNTER — Other Ambulatory Visit: Payer: 59

## 2022-04-28 ENCOUNTER — Other Ambulatory Visit: Payer: Medicaid Other

## 2022-04-28 DIAGNOSIS — Z369 Encounter for antenatal screening, unspecified: Secondary | ICD-10-CM

## 2022-04-28 DIAGNOSIS — Z3480 Encounter for supervision of other normal pregnancy, unspecified trimester: Secondary | ICD-10-CM

## 2022-04-28 DIAGNOSIS — Z3A09 9 weeks gestation of pregnancy: Secondary | ICD-10-CM

## 2022-04-29 ENCOUNTER — Other Ambulatory Visit (HOSPITAL_COMMUNITY)
Admission: RE | Admit: 2022-04-29 | Discharge: 2022-04-29 | Disposition: A | Discharge status: Home / Self Care | Payer: 59 | Source: Ambulatory Visit | Attending: Obstetrics and Gynecology | Admitting: Obstetrics and Gynecology

## 2022-04-29 DIAGNOSIS — Z3A09 9 weeks gestation of pregnancy: Secondary | ICD-10-CM | POA: Insufficient documentation

## 2022-04-29 DIAGNOSIS — Z3480 Encounter for supervision of other normal pregnancy, unspecified trimester: Secondary | ICD-10-CM | POA: Insufficient documentation

## 2022-04-29 DIAGNOSIS — Z113 Encounter for screening for infections with a predominantly sexual mode of transmission: Secondary | ICD-10-CM | POA: Diagnosis present

## 2022-04-29 LAB — CBC/D/PLT+RPR+RH+ABO+RUBIGG...
Antibody Screen: NEGATIVE
Basophils Absolute: 0 10*3/uL (ref 0.0–0.2)
Basos: 0 %
EOS (ABSOLUTE): 0 10*3/uL (ref 0.0–0.4)
Eos: 1 %
HCV Ab: NONREACTIVE
HIV Screen 4th Generation wRfx: NONREACTIVE
Hematocrit: 34.8 % (ref 34.0–46.6)
Hemoglobin: 11.6 g/dL (ref 11.1–15.9)
Hepatitis B Surface Ag: NEGATIVE
Immature Grans (Abs): 0 10*3/uL (ref 0.0–0.1)
Immature Granulocytes: 0 %
Lymphocytes Absolute: 1.8 10*3/uL (ref 0.7–3.1)
Lymphs: 32 %
MCH: 29.4 pg (ref 26.6–33.0)
MCHC: 33.3 g/dL (ref 31.5–35.7)
MCV: 88 fL (ref 79–97)
Monocytes Absolute: 0.4 10*3/uL (ref 0.1–0.9)
Monocytes: 8 %
Neutrophils Absolute: 3.3 10*3/uL (ref 1.4–7.0)
Neutrophils: 59 %
Platelets: 294 10*3/uL (ref 150–450)
RBC: 3.94 x10E6/uL (ref 3.77–5.28)
RDW: 13.2 % (ref 11.7–15.4)
RPR Ser Ql: NONREACTIVE
Rh Factor: POSITIVE
Rubella Antibodies, IGG: 1.95 index (ref >0.99)
Varicella zoster IgG: 826 index (ref >165)
WBC: 5.5 10*3/uL (ref 3.4–10.8)

## 2022-04-29 LAB — URINE CYTOLOGY ANCILLARY ONLY
Chlamydia: NEGATIVE
Comment: NEGATIVE
Comment: NORMAL
Neisseria Gonorrhea: NEGATIVE

## 2022-04-29 LAB — PAIN MGT SCRN (14 DRUGS), UR
Amphetamine Scrn, Ur: NEGATIVE ng/mL
BARBITURATE SCREEN URINE: NEGATIVE ng/mL
BENZODIAZEPINE SCREEN, URINE: NEGATIVE ng/mL
Buprenorphine, Urine: NEGATIVE ng/mL
CANNABINOIDS UR QL SCN: NEGATIVE ng/mL
Cocaine (Metab) Scrn, Ur: NEGATIVE ng/mL
Creatinine(Crt), U: 78 mg/dL (ref 20.0–300.0)
Fentanyl, Urine: NEGATIVE pg/mL
Meperidine Screen, Urine: NEGATIVE ng/mL
Methadone Screen, Urine: NEGATIVE ng/mL
OXYCODONE+OXYMORPHONE UR QL SCN: NEGATIVE ng/mL
Opiate Scrn, Ur: NEGATIVE ng/mL
Ph of Urine: 6.6 (ref 4.5–8.9)
Phencyclidine Qn, Ur: NEGATIVE ng/mL
Propoxyphene Scrn, Ur: NEGATIVE ng/mL
Tramadol Screen, Urine: NEGATIVE ng/mL

## 2022-04-29 LAB — URINALYSIS, ROUTINE W REFLEX MICROSCOPIC
Bilirubin, UA: NEGATIVE
Glucose, UA: NEGATIVE
Ketones, UA: NEGATIVE
Leukocytes,UA: NEGATIVE
Nitrite, UA: NEGATIVE
Protein,UA: NEGATIVE
RBC, UA: NEGATIVE
Specific Gravity, UA: 1.016 (ref 1.005–1.030)
Urobilinogen, Ur: 0.2 mg/dL (ref 0.2–1.0)
pH, UA: 7 (ref 5.0–7.5)

## 2022-04-29 LAB — HCV INTERPRETATION

## 2022-04-29 LAB — NICOTINE SCREEN, URINE: Cotinine Ql Scrn, Ur: NEGATIVE ng/mL

## 2022-04-30 LAB — URINE CULTURE, OB REFLEX

## 2022-04-30 LAB — CULTURE, OB URINE

## 2022-05-07 ENCOUNTER — Other Ambulatory Visit: Payer: Self-pay | Admitting: Obstetrics and Gynecology

## 2022-05-07 ENCOUNTER — Ambulatory Visit (INDEPENDENT_AMBULATORY_CARE_PROVIDER_SITE_OTHER): Payer: 59

## 2022-05-07 DIAGNOSIS — Z3687 Encounter for antenatal screening for uncertain dates: Secondary | ICD-10-CM

## 2022-05-07 DIAGNOSIS — Z369 Encounter for antenatal screening, unspecified: Secondary | ICD-10-CM

## 2022-05-07 DIAGNOSIS — Z3480 Encounter for supervision of other normal pregnancy, unspecified trimester: Secondary | ICD-10-CM

## 2022-05-13 ENCOUNTER — Other Ambulatory Visit: Payer: Self-pay

## 2022-05-13 ENCOUNTER — Encounter: Payer: Self-pay | Admitting: Obstetrics and Gynecology

## 2022-05-13 ENCOUNTER — Ambulatory Visit (INDEPENDENT_AMBULATORY_CARE_PROVIDER_SITE_OTHER): Payer: 59 | Admitting: Obstetrics and Gynecology

## 2022-05-13 VITALS — BP 110/75 | HR 79 | Wt 218.4 lb

## 2022-05-13 DIAGNOSIS — Z113 Encounter for screening for infections with a predominantly sexual mode of transmission: Secondary | ICD-10-CM

## 2022-05-13 DIAGNOSIS — Z369 Encounter for antenatal screening, unspecified: Secondary | ICD-10-CM

## 2022-05-13 DIAGNOSIS — Z3480 Encounter for supervision of other normal pregnancy, unspecified trimester: Secondary | ICD-10-CM

## 2022-05-13 DIAGNOSIS — Z3481 Encounter for supervision of other normal pregnancy, first trimester: Secondary | ICD-10-CM | POA: Diagnosis not present

## 2022-05-13 DIAGNOSIS — Z3A11 11 weeks gestation of pregnancy: Secondary | ICD-10-CM

## 2022-05-13 DIAGNOSIS — Z3A09 9 weeks gestation of pregnancy: Secondary | ICD-10-CM

## 2022-05-13 LAB — POCT URINALYSIS DIPSTICK OB
Bilirubin, UA: NEGATIVE
Blood, UA: NEGATIVE
Glucose, UA: NEGATIVE
Ketones, UA: NEGATIVE
Leukocytes, UA: NEGATIVE
Nitrite, UA: NEGATIVE
POC,PROTEIN,UA: NEGATIVE
Spec Grav, UA: <=1.005 — AB (ref 1.010–1.025)
Urobilinogen, UA: 0.2 E.U./dL
pH, UA: 7.5 (ref 5.0–8.0)

## 2022-05-13 NOTE — Progress Notes (Signed)
Patient presents today for New OB physical. G3P1011. She states noticing an increase of salivation which leads to her coughing more than usual. Patient is  up to date for her pap smear. She states she wants genetic testing, [id number] to be done today. Patient states no other questions or concerns at this time.

## 2022-05-13 NOTE — Progress Notes (Signed)
NOB: Blood work and [employer] today.  Patient complains of excessive saliva during pregnancy.  We have discussed this in some detail and some strategies also discussed.  Expect resolution as pregnancy continues.  aFP next visit  Physical examination General NAD, Conversant  HEENT Atraumatic; Op clear with mmm.  Normo-cephalic. Pupils reactive. Anicteric sclerae  Thyroid/Neck Smooth without nodularity or enlargement. Normal ROM.  Neck Supple.  Skin No rashes, lesions or ulceration. Normal palpated skin turgor. No nodularity.  Breasts: No masses or discharge.  Symmetric.  No axillary adenopathy.  Lungs: Clear to auscultation.No rales or wheezes. Normal Respiratory effort, no retractions.  Heart: NSR.  No murmurs or rubs appreciated. No periferal edema  Abdomen: Soft.  Non-tender.  No masses.  No HSM. No hernia  Extremities: Moves all appropriately.  Normal ROM for age. No lymphadenopathy.  Neuro: Oriented to PPT.  Normal mood. Normal affect.     Pelvic:   Vulva: Normal appearance.  No lesions.  Vagina: No lesions or abnormalities noted.  Support: Normal pelvic support.  Urethra No masses tenderness or scarring.  Meatus Normal size without lesions or prolapse.  Cervix: Normal appearance.  No lesions.  Anus: Normal exam.  No lesions.  Perineum: Normal exam.  No lesions.        Bimanual   Adnexae: No masses.  Non-tender to palpation.  Uterus: Enlarged. 11wks  Non-tender.  Mobile.  AV.  Adnexae: No masses.  Non-tender to palpation.  Cul-de-sac: Negative for abnormality.  Adnexae: No masses.  Non-tender to palpation.         Pelvimetry   Diagonal: Reached.  Spines: Average.  Sacrum: Concave.  Pubic Arch: Normal.

## 2022-05-18 LAB — MATERNIT 21 PLUS CORE, BLOOD
Fetal Fraction: 9
Result (T21): NEGATIVE
Trisomy 13 (Patau syndrome): NEGATIVE
Trisomy 18 (Edwards syndrome): NEGATIVE
Trisomy 21 (Down syndrome): NEGATIVE

## 2022-06-11 ENCOUNTER — Encounter: Payer: Self-pay | Admitting: Certified Nurse Midwife

## 2022-06-11 ENCOUNTER — Ambulatory Visit (INDEPENDENT_AMBULATORY_CARE_PROVIDER_SITE_OTHER): Payer: 59 | Admitting: Certified Nurse Midwife

## 2022-06-11 VITALS — BP 109/73 | HR 89 | Wt 223.1 lb

## 2022-06-11 DIAGNOSIS — Z3A16 16 weeks gestation of pregnancy: Secondary | ICD-10-CM

## 2022-06-11 DIAGNOSIS — Z1379 Encounter for other screening for genetic and chromosomal anomalies: Secondary | ICD-10-CM

## 2022-06-11 LAB — POCT URINALYSIS DIPSTICK
Bilirubin, UA: NEGATIVE
Blood, UA: NEGATIVE
Glucose, UA: NEGATIVE
Ketones, UA: NEGATIVE
Leukocytes, UA: NEGATIVE
Nitrite, UA: NEGATIVE
Protein, UA: NEGATIVE
Spec Grav, UA: 1.015 (ref 1.010–1.025)
Urobilinogen, UA: 0.2 E.U./dL
pH, UA: 7 (ref 5.0–8.0)

## 2022-06-11 NOTE — Progress Notes (Signed)
ROB doing well, not sure if she is feeling movement yet . Reassurance given. Discussed anatomy u/s next visit. She is in agreement. AFP testing today. Pt state she has some residual effects of COVID that she had in September. Discussed over the counter meds for symptom treatment. Med list included in after visit summary. She also notes excess saliva that she sometimes has to spit out. Discussed ptyalism and reassurance given . Follow up 4 wks for u/s and ROB.   Doreene Burke, CNM

## 2022-06-11 NOTE — Patient Instructions (Addendum)
Common Medications Safe in Pregnancy  Acne:      Constipation:  Benzoyl Peroxide     Colace  Clindamycin      Dulcolax Suppository  Topica Erythromycin     Fibercon  Salicylic Acid      Metamucil         Miralax AVOID:        Senakot   Accutane    Cough:  Retin-A       Cough Drops  Tetracycline      Phenergan w/ Codeine if Rx  Minocycline      Robitussin (Plain & DM)  Antibiotics:     Crabs/Lice:  Ceclor       RID  Cephalosporins    AVOID:  E-Mycins      Kwell  Keflex  Macrobid/Macrodantin   Diarrhea:  Penicillin      Kao-Pectate  Zithromax      Imodium AD         PUSH FLUIDS AVOID:       Cipro     Fever:  Tetracycline      Tylenol (Regular or Extra  Minocycline       Strength)  Levaquin      Extra Strength-Do not          Exceed 8 tabs/24 hrs Caffeine:        <200mg/day (equiv. To 1 cup of coffee or  approx. 3 12 oz sodas)         Gas: Cold/Hayfever:       Gas-X  Benadryl      Mylicon  Claritin       Phazyme  **Claritin-D        Chlor-Trimeton    Headaches:  Dimetapp      ASA-Free Excedrin  Drixoral-Non-Drowsy     Cold Compress  Mucinex (Guaifenasin)     Tylenol (Regular or Extra  Sudafed/Sudafed-12 Hour     Strength)  **Sudafed PE Pseudoephedrine   Tylenol Cold & Sinus     Vicks Vapor Rub  Zyrtec  **AVOID if Problems With Blood Pressure         Heartburn: Avoid lying down for at least 1 hour after meals  Aciphex      Maalox     Rash:  Milk of Magnesia     Benadryl    Mylanta       1% Hydrocortisone Cream  Pepcid  Pepcid Complete   Sleep Aids:  Prevacid      Ambien   Prilosec       Benadryl  Rolaids       Chamomile Tea  Tums (Limit 4/day)     Unisom         Tylenol PM         Warm milk-add vanilla or  Hemorrhoids:       Sugar for taste  Anusol/Anusol H.C.  (RX: Analapram 2.5%)  Sugar Substitutes:  Hydrocortisone OTC     Ok in moderation  Preparation H      Tucks        Vaseline lotion applied to tissue with  wiping    Herpes:     Throat:  Acyclovir      Oragel  Famvir  Valtrex     Vaccines:         Flu Shot Leg Cramps:       *Gardasil  Benadryl      Hepatitis A         Hepatitis B Nasal Spray:         Pneumovax  Saline Nasal Spray     Polio Booster         Tetanus Nausea:       Tuberculosis test or PPD  Vitamin B6 25 mg TID   AVOID:    Dramamine      *Gardasil  Emetrol       Live Poliovirus  Ginger Root 250 mg QID    MMR (measles, mumps &  High Complex Carbs @ Bedtime    rebella)  Sea Bands-Accupressure    Varicella (Chickenpox)  Unisom 1/2 tab TID     *No known complications           If received before Pain:         Known pregnancy;   Darvocet       Resume series after  Lortab        Delivery  Percocet    Yeast:   Tramadol      Femstat  Tylenol 3      Gyne-lotrimin  Ultram       Monistat  Vicodin           MISC:         All Sunscreens           Hair Coloring/highlights          Insect Repellant's          (Including DEET)         Mystic Tans Round Ligament Pain  The round ligaments are a pair of cord-like tissues that help support the uterus. They can become a source of pain during pregnancy as the ligaments soften and stretch as the baby grows. The pain usually begins in the second trimester (13-28 weeks) of pregnancy, and should only last for a few seconds when it occurs. However, the pain can come and go until the baby is delivered. The pain does not cause harm to the baby. Round ligament pain is usually a short, sharp, and pinching pain, but it can also be a dull, lingering, and aching pain. The pain is felt in the lower side of the abdomen or in the groin. It usually starts deep in the groin and moves up to the outside of the hip area. The pain may happen when you: Suddenly change position, such as quickly going from a sitting to standing position. Do physical activity. Cough or sneeze. Follow these instructions at home: Managing pain  When the pain starts, relax. Then,  try any of these methods to help with the pain: Sit down. Flex your knees up to your abdomen. Lie on your side with one pillow under your abdomen and another pillow between your legs. Sit in a warm bath for 15-20 minutes or until the pain goes away. General instructions Watch your condition for any changes. Move slowly when you sit down or stand up. Stop or reduce your physical activities if they cause pain. Avoid long walks if they cause pain. Take over-the-counter and prescription medicines only as told by your health care provider. Keep all follow-up visits. This is important. Contact a health care provider if: Your pain does not go away with treatment. You feel pain in your back that you did not have before. Your medicine is not helping. You have a fever or chills. You have nausea or vomiting. You have diarrhea. You have pain when you urinate. Get help right away if: You have pain that is a rhythmic, cramping pain similar to labor pains. Labor pains are usually  2 minutes apart, last for about 1 minute, and involve a bearing down feeling or pressure in your pelvis. You have vaginal bleeding. These symptoms may represent a serious problem that is an emergency. Do not wait to see if the symptoms will go away. Get medical help right away. Call your local emergency services (911 in the [place] Do not drive yourself to the hospital. Summary Round ligament pain is felt in the lower abdomen or groin. This pain usually begins in the second trimester (13-28 weeks) and should only last for a few seconds when it occurs. You may notice the pain when you suddenly change position, when you cough or sneeze, or during physical activity. Relaxing, flexing your knees to your abdomen, lying on one side, or taking a warm bath may help to get rid of the pain. Contact your health care provider if the pain does not go away. This information is not intended to replace advice given to you by your health care  provider. Make sure you discuss any questions you have with your health care provider. Document Revised: 08/28/2020 Document Reviewed: 08/28/2020 Elsevier Patient Education  [place].

## 2022-06-16 LAB — AFP, SERUM, OPEN SPINA BIFIDA
AFP MoM: 1.23
AFP Value: 36.1 ng/mL
Gest. Age on Collection Date: 16 weeks
Maternal Age At EDD: 30.3 yr
OSBR Risk 1 IN: 10000
Test Results:: NEGATIVE
Weight: 223 [lb_av]

## 2022-06-19 ENCOUNTER — Telehealth: Payer: Self-pay | Admitting: Licensed Practical Nurse

## 2022-06-19 NOTE — Telephone Encounter (Signed)
I contacted patient via phone. Olivia Frost scheduled changed for 07/09/21. I have patient rescheduled for same day/ time but with [place] Spanish. Voicemail was not set up unable to leave message.

## 2022-06-19 NOTE — Telephone Encounter (Signed)
Pt aware.

## 2022-06-29 NOTE — L&D Delivery Note (Addendum)
Delivery Note Olivia Frost is a 32 y.o. G3P1011 at [redacted]w[redacted]d admitted for NRNST.   GBS Status: Negative/-- (04/25 1617) Maximum Maternal Temperature: 98.1  Labor course: Initial SVE: 2/80/-3. Augmentation with: N/A. She then progressed to complete.  ROM: 0h 50m with clear fluid  Birth: At 1131 a viable female was delivered via spontaneous vaginal delivery (Presentation: cephalic;MOA). Nuchal cord present: Yes, reduced easily over the head.  Shoulders and body delivered in usual fashion. Infant placed directly on mom's abdomen for bonding/skin-to-skin, baby dried and stimulated. Cord clamped x 2 after 1 minute and cut by dad.  Cord blood collected.  The placenta separated spontaneously and delivered via gentle cord traction.  Pitocin infused rapidly IV per protocol.  Fundus firm with massage.  Placenta inspected and appears to be intact with a 3 VC.  Placenta/Cord with the following complications: none .  Cord pH: n/a Sponge and instrument count were correct x2.  Intrapartum complications:  None Anesthesia:  epidural Episiotomy: none Lacerations:  small. Left labial Suture Repair:  not repaired, hemostatic EBL (mL): 75   Infant: APGAR (1 MIN): 9   APGAR (5 MINS): 9   APGAR (10 MINS):    Infant weight: pending  Mom to postpartum.  Baby to Couplet care / Skin to Skin. Placenta to Home with patient   Plans to Breastfeed Contraception:  undecided Circumcision: N/A  Note sent to [facility]  AOB  for pp visit.  Karis Juba, SNM 11/25/2022 12:49 PM   Attestation of CNM Supervision of Midwife Student: Evaluation and management procedures were performed by the midwife student under my supervision. I was immediately available for direct supervision, assistance and direction throughout this encounter.  I also confirm that I have verified the information documented in the resident's note, and that I have also personally reperformed the pertinent components of the physical exam and all of  the medical decision making activities.  I have also made any necessary editorial changes.  Brand Males, CNM 11/25/2022 1:22 PM

## 2022-07-09 ENCOUNTER — Ambulatory Visit (INDEPENDENT_AMBULATORY_CARE_PROVIDER_SITE_OTHER): Payer: 59 | Admitting: Obstetrics and Gynecology

## 2022-07-09 ENCOUNTER — Encounter: Payer: 59 | Admitting: Licensed Practical Nurse

## 2022-07-09 ENCOUNTER — Ambulatory Visit (INDEPENDENT_AMBULATORY_CARE_PROVIDER_SITE_OTHER): Payer: 59

## 2022-07-09 ENCOUNTER — Encounter: Payer: Self-pay | Admitting: Obstetrics

## 2022-07-09 VITALS — BP 114/78 | HR 80 | Wt 229.0 lb

## 2022-07-09 DIAGNOSIS — Z3A20 20 weeks gestation of pregnancy: Secondary | ICD-10-CM

## 2022-07-09 DIAGNOSIS — Z3482 Encounter for supervision of other normal pregnancy, second trimester: Secondary | ICD-10-CM

## 2022-07-09 DIAGNOSIS — Z363 Encounter for antenatal screening for malformations: Secondary | ICD-10-CM | POA: Diagnosis not present

## 2022-07-09 DIAGNOSIS — Z3A16 16 weeks gestation of pregnancy: Secondary | ICD-10-CM

## 2022-07-09 LAB — POCT URINALYSIS DIPSTICK OB
Bilirubin, UA: NEGATIVE
Blood, UA: NEGATIVE
Glucose, UA: NEGATIVE
Ketones, UA: NEGATIVE
Leukocytes, UA: NEGATIVE
Nitrite, UA: NEGATIVE
POC,PROTEIN,UA: NEGATIVE
Spec Grav, UA: 1.02 (ref 1.010–1.025)
Urobilinogen, UA: 0.2 E.U./dL
pH, UA: 7 (ref 5.0–8.0)

## 2022-07-09 NOTE — Progress Notes (Signed)
ROB. Patient states felling baby move, she is excited about this. Anatomy ultrasound preformed today.She states no questions or concerns at this time.

## 2022-07-09 NOTE — Progress Notes (Signed)
ROB: Patient reports occasional pelvic pressure but nothing severe.  Reports fetal movement.  Anatomy scan completed today-normal.  Taking vitamins as directed.

## 2022-07-16 ENCOUNTER — Telehealth: Payer: Self-pay

## 2022-07-16 ENCOUNTER — Ambulatory Visit (INDEPENDENT_AMBULATORY_CARE_PROVIDER_SITE_OTHER): Payer: 59 | Admitting: Obstetrics & Gynecology

## 2022-07-16 VITALS — BP 100/60 | Wt 231.0 lb

## 2022-07-16 DIAGNOSIS — Z3A21 21 weeks gestation of pregnancy: Secondary | ICD-10-CM

## 2022-07-16 DIAGNOSIS — Z3480 Encounter for supervision of other normal pregnancy, unspecified trimester: Secondary | ICD-10-CM

## 2022-07-16 DIAGNOSIS — Z3482 Encounter for supervision of other normal pregnancy, second trimester: Secondary | ICD-10-CM

## 2022-07-16 LAB — POCT URINALYSIS DIPSTICK OB
Bilirubin, UA: NEGATIVE
Blood, UA: NEGATIVE
Glucose, UA: NEGATIVE
Ketones, UA: NEGATIVE
Leukocytes, UA: NEGATIVE
Nitrite, UA: NEGATIVE
POC,PROTEIN,UA: NEGATIVE
Spec Grav, UA: 1.01 (ref 1.010–1.025)
Urobilinogen, UA: 1 E.U./dL
pH, UA: 6 (ref 5.0–8.0)

## 2022-07-16 NOTE — Telephone Encounter (Signed)
Patient would like to be scheduled for office visit for vaginal pain. She said she feels pressure on top of her vagina. She is having difficulty walking and her vagina feels bruised. Patient was advised to come in to be seen. Discussed the signs of round ligament pain as well. Please Advise.

## 2022-07-16 NOTE — Progress Notes (Signed)
   PRENATAL VISIT NOTE  Subjective:  Olivia Frost is a 32 y.o. G3P1011 at [redacted]w[redacted]d being seen today for ongoing prenatal care. This is a work in visit due to 3 days of increasing vaginal pressure. She denies bleeding, ROM. She reports FM.   She is currently monitored for the following issues for this low-risk pregnancy and has Abdominal pain affecting pregnancy; Normal labor; Nexplanon in place; and Supervision of other normal pregnancy, antepartum on their problem list.  Contractions: Not present. Vag. Bleeding: None.  Movement: Present. Denies leaking of fluid.   The following portions of the patient's history were reviewed and updated as appropriate: allergies, current medications, past family history, past medical history, past social history, past surgical history and problem list.   Objective:   Vitals:   07/16/22 1420  BP: 100/60  Weight: 231 lb (104.8 kg)    Fetal Status:     Movement: Present     General:  Alert, oriented and cooperative. Patient is in no acute distress.  Skin: Skin is warm and dry. No rash noted.   Cardiovascular: Normal heart rate noted  Respiratory: Normal respiratory effort, no problems with respiration noted  Abdomen: Soft, gravid, appropriate for gestational age.  Pain/Pressure: Present     Pelvic: Cervical exam performed in the presence of a chaperone        Extremities: Normal range of motion.    CVX- closed/thick/high/posterior/firm No vulvar varicosities noted.  Mental Status: Normal mood and affect. Normal behavior. Normal judgment and thought content.   Assessment and Plan:  Pregnancy: G3P1011 at [redacted]w[redacted]d 1. Supervision of other normal pregnancy, antepartum   2. [redacted] weeks gestation of pregnancy  3. Vaginal pressure- I don't think this is PTL at all. Since she says that it gets better when she is off of her feet, I have advised her to rest when possible.   Preterm labor symptoms and general obstetric precautions including but not limited to  vaginal bleeding, contractions, leaking of fluid and fetal movement were reviewed in detail with the patient. Please refer to After Visit Summary for other counseling recommendations.   No follow-ups on file.  Future Appointments  Date Time Provider Department Center  08/06/2022  8:15 AM Philip Aspen, CNM [facility] None    Emily Filbert, MD

## 2022-08-06 ENCOUNTER — Ambulatory Visit (INDEPENDENT_AMBULATORY_CARE_PROVIDER_SITE_OTHER): Payer: 59 | Admitting: Certified Nurse Midwife

## 2022-08-06 ENCOUNTER — Encounter: Payer: Self-pay | Admitting: Certified Nurse Midwife

## 2022-08-06 VITALS — BP 107/67 | HR 79 | Wt 235.7 lb

## 2022-08-06 DIAGNOSIS — Z3482 Encounter for supervision of other normal pregnancy, second trimester: Secondary | ICD-10-CM

## 2022-08-06 DIAGNOSIS — Z3A24 24 weeks gestation of pregnancy: Secondary | ICD-10-CM

## 2022-08-06 LAB — POCT URINALYSIS DIPSTICK
Bilirubin, UA: NEGATIVE
Blood, UA: NEGATIVE
Glucose, UA: NEGATIVE
Ketones, UA: NEGATIVE
Leukocytes, UA: NEGATIVE
Nitrite, UA: NEGATIVE
Protein, UA: NEGATIVE
Spec Grav, UA: 1.01 (ref 1.010–1.025)
Urobilinogen, UA: 0.2 E.U./dL
pH, UA: 7.5 (ref 5.0–8.0)

## 2022-08-06 NOTE — Progress Notes (Signed)
ROB doing well, feeling movement. Discussed glucose screen , handout given . She denies any concerns today. Discussed use of pregnancy belt to help with pelvic pressure. She verbalizes and states she is going to look into getting one.   Follow up 4 wks.   Philip Aspen, CNM

## 2022-08-06 NOTE — Patient Instructions (Signed)
Oral Glucose Tolerance Test During Pregnancy Why am I having this test? The oral glucose tolerance test (OGTT) is done to check how your body processes blood sugar (glucose). This is one of several tests used to diagnose diabetes that develops during pregnancy (gestational diabetes mellitus). Gestational diabetes is a short-term form of diabetes that some women develop while they are pregnant. It usually occurs during the second trimester of pregnancy and goes away after delivery. Testing, or screening, for gestational diabetes usually occurs at weeks 24-28 of pregnancy. You may have the OGTT test after having a 1-hour glucose screening test if the results from that test indicate that you may have gestational diabetes. This test may also be needed if: You have a history of gestational diabetes. There is a history of giving birth to very large babies or of losing pregnancies (having stillbirths). You have signs and symptoms of diabetes, such as: Changes in your eyesight. Tingling or numbness in your hands or feet. Changes in hunger, thirst, and urination, and these are not explained by your pregnancy. What is being tested? This test measures the amount of glucose in your blood at different times during a period of 3 hours. This shows how well your body can process glucose. What kind of sample is taken?  Blood samples are required for this test. They are usually collected by inserting a needle into a blood vessel. How do I prepare for this test? For 3 days before your test, eat normally. Have plenty of carbohydrate-rich foods. Follow instructions from your health care provider about: Eating or drinking restrictions on the day of the test. You may be asked not to eat or drink anything other than water (to fast) starting 8-10 hours before the test. Changing or stopping your regular medicines. Some medicines may interfere with this test. Tell a health care provider about: All medicines you are  taking, including vitamins, herbs, eye drops, creams, and over-the-counter medicines. Any blood disorders you have. Any surgeries you have had. Any medical conditions you have. What happens during the test? First, your blood glucose will be measured. This is referred to as your fasting blood glucose because you fasted before the test. Then, you will drink a glucose solution that contains a certain amount of glucose. Your blood glucose will be measured again 1, 2, and 3 hours after you drink the solution. This test takes about 3 hours to complete. You will need to stay at the testing location during this time. During the testing period: Do not eat or drink anything other than the glucose solution. Do not exercise. Do not use any products that contain nicotine or tobacco, such as cigarettes, e-cigarettes, and chewing tobacco. These can affect your test results. If you need help quitting, ask your health care provider. The testing procedure may vary among health care providers and hospitals. How are the results reported? Your results will be reported as milligrams of glucose per deciliter of blood (mg/dL) or millimoles per liter (mmol/L). There is more than one source for screening and diagnosis reference values used to diagnose gestational diabetes. Your health care provider will compare your results to normal values that were established after testing a large group of people (reference values). Reference values may vary among labs and hospitals. For this test (Carpenter-Coustan), reference values are: Fasting: 95 mg/dL (5.3 mmol/L). 1 hour: 180 mg/dL (10.0 mmol/L). 2 hour: 155 mg/dL (8.6 mmol/L). 3 hour: 140 mg/dL (7.8 mmol/L). What do the results mean? Results below the reference values are   considered normal. If two or more of your blood glucose levels are at or above the reference values, you may be diagnosed with gestational diabetes. If only one level is high, your health care provider may  suggest repeat testing or other tests to confirm a diagnosis. Talk with your health care provider about what your results mean. Questions to ask your health care provider Ask your health care provider, or the department that is doing the test: When will my results be ready? How will I get my results? What are my treatment options? What other tests do I need? What are my next steps? Summary The oral glucose tolerance test (OGTT) is one of several tests used to diagnose diabetes that develops during pregnancy (gestational diabetes mellitus). Gestational diabetes is a short-term form of diabetes that some women develop while they are pregnant. You may have the OGTT test after having a 1-hour glucose screening test if the results from that test show that you may have gestational diabetes. You may also have this test if you have any symptoms or risk factors for this type of diabetes. Talk with your health care provider about what your results mean. This information is not intended to replace advice given to you by your health care provider. Make sure you discuss any questions you have with your health care provider. Document Revised: 01/20/2022 Document Reviewed: 11/23/2019 Elsevier Patient Education  2023 Elsevier Inc.  

## 2022-09-02 ENCOUNTER — Other Ambulatory Visit: Payer: Self-pay

## 2022-09-02 DIAGNOSIS — Z3A28 28 weeks gestation of pregnancy: Secondary | ICD-10-CM

## 2022-09-03 ENCOUNTER — Other Ambulatory Visit: Payer: 59

## 2022-09-03 ENCOUNTER — Ambulatory Visit (INDEPENDENT_AMBULATORY_CARE_PROVIDER_SITE_OTHER): Payer: 59 | Admitting: Obstetrics & Gynecology

## 2022-09-03 ENCOUNTER — Encounter: Payer: Self-pay | Admitting: Obstetrics & Gynecology

## 2022-09-03 VITALS — BP 109/86 | HR 98 | Wt 244.6 lb

## 2022-09-03 DIAGNOSIS — O99213 Obesity complicating pregnancy, third trimester: Secondary | ICD-10-CM

## 2022-09-03 DIAGNOSIS — O99210 Obesity complicating pregnancy, unspecified trimester: Secondary | ICD-10-CM

## 2022-09-03 DIAGNOSIS — Z3480 Encounter for supervision of other normal pregnancy, unspecified trimester: Secondary | ICD-10-CM

## 2022-09-03 DIAGNOSIS — E669 Obesity, unspecified: Secondary | ICD-10-CM

## 2022-09-03 DIAGNOSIS — Z3A28 28 weeks gestation of pregnancy: Secondary | ICD-10-CM

## 2022-09-03 DIAGNOSIS — Z23 Encounter for immunization: Secondary | ICD-10-CM

## 2022-09-03 HISTORY — DX: Obesity complicating pregnancy, unspecified trimester: O99.210

## 2022-09-03 LAB — POCT URINALYSIS DIPSTICK OB
Bilirubin, UA: NEGATIVE
Blood, UA: NEGATIVE
Glucose, UA: NEGATIVE
Ketones, UA: NEGATIVE
Leukocytes, UA: NEGATIVE
Nitrite, UA: NEGATIVE
POC,PROTEIN,UA: NEGATIVE
Spec Grav, UA: 1.01 (ref 1.010–1.025)
Urobilinogen, UA: 1 E.U./dL
pH, UA: 6.5 (ref 5.0–8.0)

## 2022-09-03 NOTE — Patient Instructions (Signed)
Tdap (Tetanus, Diphtheria, Pertussis) Vaccine: What You Need to Know 1. Why get vaccinated? Tdap vaccine can prevent tetanus, diphtheria, and pertussis. Diphtheria and pertussis spread from person to person. Tetanus enters the body through cuts or wounds. TETANUS (T) causes painful stiffening of the muscles. Tetanus can lead to serious health problems, including being unable to open the mouth, having trouble swallowing and breathing, or death. DIPHTHERIA (D) can lead to difficulty breathing, heart failure, paralysis, or death. PERTUSSIS (aP), also known as "whooping cough," can cause uncontrollable, violent coughing that makes it hard to breathe, eat, or drink. Pertussis can be extremely serious especially in babies and young children, causing pneumonia, convulsions, brain damage, or death. In teens and adults, it can cause weight loss, loss of bladder control, passing out, and rib fractures from severe coughing. 2. Tdap vaccine Tdap is only for children 7 years and older, adolescents, and adults.  Adolescents should receive a single dose of Tdap, preferably at age 10 or 71 years. Pregnant people should get a dose of Tdap during every pregnancy, preferably during the early part of the third trimester, to help protect the newborn from pertussis. Infants are most at risk for severe, life-threatening complications from pertussis. Adults who have never received Tdap should get a dose of Tdap. Also, adults should receive a booster dose of either Tdap or Td (a different vaccine that protects against tetanus and diphtheria but not pertussis) every 10 years, or after 5 years in the case of a severe or dirty wound or burn. Tdap may be given at the same time as other vaccines. 3. Talk with your health care provider Tell your vaccine provider if the person getting the vaccine: Has had an allergic reaction after a previous dose of any vaccine that protects against tetanus, diphtheria, or pertussis, or has any  severe, life-threatening allergies Has had a coma, decreased level of consciousness, or prolonged seizures within 7 days after a previous dose of any pertussis vaccine (DTP, DTaP, or Tdap) Has seizures or another nervous system problem Has ever had Guillain-Barr Syndrome (also called "GBS") Has had severe pain or swelling after a previous dose of any vaccine that protects against tetanus or diphtheria In some cases, your health care provider may decide to postpone Tdap vaccination until a future visit. People with minor illnesses, such as a cold, may be vaccinated. People who are moderately or severely ill should usually wait until they recover before getting Tdap vaccine.  Your health care provider can give you more information. 4. Risks of a vaccine reaction Pain, redness, or swelling where the shot was given, mild fever, headache, feeling tired, and nausea, vomiting, diarrhea, or stomachache sometimes happen after Tdap vaccination. People sometimes faint after medical procedures, including vaccination. Tell your provider if you feel dizzy or have vision changes or ringing in the ears.  As with any medicine, there is a very remote chance of a vaccine causing a severe allergic reaction, other serious injury, or death. 5. What if there is a serious problem? An allergic reaction could occur after the vaccinated person leaves the clinic. If you see signs of a severe allergic reaction (hives, swelling of the face and throat, difficulty breathing, a fast heartbeat, dizziness, or weakness), call 9-1-1 and get the person to the nearest hospital. For other signs that concern you, call your health care provider.  Adverse reactions should be reported to the Vaccine Adverse Event Reporting System (VAERS). Your health care provider will usually file this report, or you  can do it yourself. Visit the VAERS website at www.vaers.SamedayNews.es or call 760 234 8079. VAERS is only for reporting reactions, and VAERS staff  members do not give medical advice. 6. The National Vaccine Injury Compensation Program The [employer] Vaccine Injury Compensation Program (VICP) is a federal program that was created to compensate people who may have been injured by certain vaccines. Claims regarding alleged injury or death due to vaccination have a time limit for filing, which may be as short as two years. Visit the VICP website at GoldCloset.com.ee or call 843-681-0476 to learn about the program and about filing a claim. 7. How can I learn more? Ask your health care provider. Call your local or state health department. Visit the website of the Food and Drug Administration (FDA) for vaccine package inserts and additional information at TraderRating.uy. Contact the Centers for Disease Control and Prevention (CDC): Call 601 488 4465 (1-800-CDC-INFO) or Visit CDC's website at http://hunter.com/. Source: CDC Vaccine Information Statement Tdap (Tetanus, Diphtheria, Pertussis) Vaccine (02/02/2020) This same material is available at http://www.wolf.info/ for no charge. This information is not intended to replace advice given to you by your health care provider. Make sure you discuss any questions you have with your health care provider. Document Revised: 03/25/2022 Document Reviewed: 03/25/2022 Elsevier Patient Education  [place].

## 2022-09-03 NOTE — Progress Notes (Signed)
ROB [redacted]w[redacted]d She is doing well. She has good fetal movement. She has no new concerns today. TDAP/BTC form done today.

## 2022-09-03 NOTE — Progress Notes (Signed)
   PRENATAL VISIT NOTE  Subjective:  Jodel SHALLAN CINDRIC is a 32 y.o. G40P1011 (10 yo daughter at home)  at 67w0dbeing seen today for ongoing prenatal care.  She is currently monitored for the following issues for this low-risk pregnancy and has Abdominal pain affecting pregnancy and Supervision of other normal pregnancy, antepartum on their problem list.  Patient reports no complaints.  Contractions: Not present. Vag. Bleeding: None.  Movement: Present. Denies leaking of fluid.   The following portions of the patient's history were reviewed and updated as appropriate: allergies, current medications, past family history, past medical history, past social history, past surgical history and problem list.   Objective:   Vitals:   09/03/22 0910  BP: 109/86  Pulse: 98  Weight: 244 lb 9.6 oz (110.9 kg)    Fetal Status:     Movement: Present     General:  Alert, oriented and cooperative. Patient is in no acute distress.  Skin: Skin is warm and dry. No rash noted.   Cardiovascular: Normal heart rate noted  Respiratory: Normal respiratory effort, no problems with respiration noted  Abdomen: Soft, gravid, appropriate for gestational age.  Pain/Pressure: Present     Pelvic: Cervical exam deferred        Extremities: Normal range of motion.  Edema: None  Mental Status: Normal mood and affect. Normal behavior. Normal judgment and thought content.   Assessment and Plan:  Pregnancy: G3P1011 at 263w0d. [redacted] weeks gestation of pregnancy  - POC Urinalysis Dipstick OB - Tdap vaccine greater than or equal to 7yo IM  2. Supervision of other normal pregnancy, antepartum  - POC Urinalysis Dipstick OB - Tdap vaccine greater than or equal to 7yo IM  3. Need for Tdap vaccination  - Tdap vaccine greater than or equal to 7yo IM  4. Obesity in pregnancy- we discussed weight gain rec's and risk of stillbirth with excessive gain.   labor symptoms and general obstetric precautions including but not  limited to vaginal bleeding, contractions, leaking of fluid and fetal movement were reviewed in detail with the patient. Please refer to After Visit Summary for other counseling recommendations.   No follow-ups on file.  No future appointments.  MyEmily FilbertMD

## 2022-09-04 ENCOUNTER — Encounter: Payer: Self-pay | Admitting: Certified Nurse Midwife

## 2022-09-04 ENCOUNTER — Encounter: Payer: Self-pay | Admitting: Obstetrics & Gynecology

## 2022-09-04 ENCOUNTER — Other Ambulatory Visit: Payer: Self-pay | Admitting: Certified Nurse Midwife

## 2022-09-04 LAB — 28 WEEK RH+PANEL
Basophils Absolute: 0 10*3/uL (ref 0.0–0.2)
Basos: 0 %
EOS (ABSOLUTE): 0.1 10*3/uL (ref 0.0–0.4)
Eos: 1 %
Gestational Diabetes Screen: 78 mg/dL (ref 70–139)
HIV Screen 4th Generation wRfx: NONREACTIVE
Hematocrit: 29.5 % — ABNORMAL LOW (ref 34.0–46.6)
Hemoglobin: 9.8 g/dL — ABNORMAL LOW (ref 11.1–15.9)
Immature Grans (Abs): 0.1 10*3/uL (ref 0.0–0.1)
Immature Granulocytes: 1 %
Lymphocytes Absolute: 1.5 10*3/uL (ref 0.7–3.1)
Lymphs: 18 %
MCH: 28.2 pg (ref 26.6–33.0)
MCHC: 33.2 g/dL (ref 31.5–35.7)
MCV: 85 fL (ref 79–97)
Monocytes Absolute: 0.6 10*3/uL (ref 0.1–0.9)
Monocytes: 7 %
Neutrophils Absolute: 6.2 10*3/uL (ref 1.4–7.0)
Neutrophils: 73 %
Platelets: 310 10*3/uL (ref 150–450)
RBC: 3.48 x10E6/uL — ABNORMAL LOW (ref 3.77–5.28)
RDW: 13.6 % (ref 11.7–15.4)
RPR Ser Ql: NONREACTIVE
WBC: 8.4 10*3/uL (ref 3.4–10.8)

## 2022-09-04 MED ORDER — FUSION PLUS PO CAPS: 1.0000 | ORAL_CAPSULE | Freq: Every day | ORAL | 11 refills | Status: DC

## 2022-09-18 ENCOUNTER — Encounter: Payer: 59 | Admitting: Obstetrics

## 2022-10-07 ENCOUNTER — Observation Stay
Admission: EM | Admit: 2022-10-07 | Discharge: 2022-10-07 | Disposition: A | Discharge status: Home / Self Care | Payer: 59 | Attending: Obstetrics | Admitting: Obstetrics

## 2022-10-07 ENCOUNTER — Other Ambulatory Visit: Payer: Self-pay

## 2022-10-07 ENCOUNTER — Telehealth: Payer: Self-pay

## 2022-10-07 ENCOUNTER — Encounter: Payer: Self-pay | Admitting: Obstetrics and Gynecology

## 2022-10-07 DIAGNOSIS — O26893 Other specified pregnancy related conditions, third trimester: Secondary | ICD-10-CM | POA: Diagnosis not present

## 2022-10-07 DIAGNOSIS — Z3A33 33 weeks gestation of pregnancy: Secondary | ICD-10-CM | POA: Insufficient documentation

## 2022-10-07 DIAGNOSIS — R102 Pelvic and perineal pain: Secondary | ICD-10-CM | POA: Diagnosis not present

## 2022-10-07 DIAGNOSIS — O2693 Pregnancy related conditions, unspecified, third trimester: Principal | ICD-10-CM | POA: Insufficient documentation

## 2022-10-07 MED ORDER — LACTATED RINGERS IV SOLN: 500.0000 mL | INTRAVENOUS | Status: DC | PRN

## 2022-10-07 MED ORDER — ONDANSETRON HCL 4 MG/2ML IJ SOLN: 4.0000 mg | Freq: Four times a day (QID) | INTRAMUSCULAR | Status: DC | PRN

## 2022-10-07 MED ORDER — SOD CITRATE-CITRIC ACID 500-334 MG/5ML PO SOLN: 30.0000 mL | ORAL | Status: DC | PRN

## 2022-10-07 MED ORDER — ACETAMINOPHEN 325 MG PO TABS: 650.0000 mg | ORAL_TABLET | ORAL | Status: DC | PRN

## 2022-10-07 NOTE — OB Triage Note (Signed)
Pt is a 32yo G3P1011, 33w 4d. She arrived to the unit with complaints of pelvic pressure that has been continuous and does not go away. She denies vaginal bleeding, reports positive fetal movement, and reports no contractions at this time VS stable, monitors applied and assessing.   Initial FHT 150 at 1630.

## 2022-10-07 NOTE — OB Triage Note (Signed)
Discharge instructions provided to pt. Pt verbalizes understanding. Vaginal bleeding and discharge, contractions, and fetal movement reviewed by RN. Follow-up care reviewed. Belly band recommended. Pt discharged home by CNM.

## 2022-10-07 NOTE — OB Triage Note (Signed)
LABOR & DELIVERY OB TRIAGE NOTE  SUBJECTIVE  HPI Olivia Frost is a 32 y.o. G3P1011 at [redacted]w[redacted]d who presents to Labor & Delivery for pelvic pressure. She reports that she has been feeling intense pelvic pressure. She is also concerned about fetal wellbeing and would like some monitoring. She denies ctx, LOF, and vaginal bleeding.  OB History     Gravida  3   Para  1   Term  1   Preterm  0   AB  1   Living  1      SAB      IAB      Ectopic      Multiple  0   Live Births  1           Scheduled Meds: Continuous Infusions:  lactated ringers     PRN Meds:.acetaminophen, lactated ringers, ondansetron, sodium citrate-citric acid  OBJECTIVE  BP 100/87 (BP Location: Left Arm)   Pulse 81   Temp 98.3 F (36.8 C) (Oral)   Resp (!) 81   Ht 5\' 8"  (1.727 m)   Wt 112.5 kg   LMP 02/19/2022 (Approximate)   BMI 37.71 kg/m   Cervical exam: Dilation: Closed (outer os 1cm) Station: Ballotable Exam by:: Quitman Livings CNM   NST I reviewed the NST and it was reactive.  Baseline: 135 Variability: moderate Accelerations: present Decelerations:none Toco: no ctx Category 1  ASSESSMENT Impression  1) Pregnancy at G3P1011, [redacted]w[redacted]d, Estimated Date of Delivery: 11/21/22 2) Reassuring maternal/fetal status  PLAN 1) Discharge home with standard return/labor precautions 2) Reviewed comfort measures. Encouraged abdominal support belt, yoga stretches.  Guadlupe Spanish, CNM 10/07/22  6:00 PM

## 2022-10-07 NOTE — Telephone Encounter (Signed)
Patient called triage asking if Dr, Marice Potter would check her cervix she's only 33 weeks, she states she having lots of pressure, Braxton Hicks.

## 2022-10-09 ENCOUNTER — Ambulatory Visit (INDEPENDENT_AMBULATORY_CARE_PROVIDER_SITE_OTHER): Payer: 59 | Admitting: Obstetrics & Gynecology

## 2022-10-09 VITALS — BP 112/66 | HR 97 | Wt 247.0 lb

## 2022-10-09 DIAGNOSIS — O99013 Anemia complicating pregnancy, third trimester: Secondary | ICD-10-CM

## 2022-10-09 DIAGNOSIS — D649 Anemia, unspecified: Secondary | ICD-10-CM

## 2022-10-09 DIAGNOSIS — O99213 Obesity complicating pregnancy, third trimester: Secondary | ICD-10-CM

## 2022-10-09 DIAGNOSIS — Z3A33 33 weeks gestation of pregnancy: Secondary | ICD-10-CM

## 2022-10-09 DIAGNOSIS — E669 Obesity, unspecified: Secondary | ICD-10-CM

## 2022-10-09 DIAGNOSIS — O99210 Obesity complicating pregnancy, unspecified trimester: Secondary | ICD-10-CM

## 2022-10-09 DIAGNOSIS — Z3480 Encounter for supervision of other normal pregnancy, unspecified trimester: Secondary | ICD-10-CM

## 2022-10-09 HISTORY — DX: Anemia complicating pregnancy, third trimester: O99.013

## 2022-10-09 NOTE — Progress Notes (Addendum)
   PRENATAL VISIT NOTE  Subjective:  Olivia Frost is a 32 y.o. G3P1011 at [redacted]w[redacted]d being seen today for ongoing prenatal care.  She is currently monitored for the following issues for this low-risk pregnancy and has Abdominal pain affecting pregnancy; Supervision of other normal pregnancy, antepartum; Obesity in pregnancy; and Anemia during pregnancy in third trimester on their problem list.  Patient reports  vaginal pressure . She was seen at the ER 2 days ago and her internal cervix was closed.  Contractions: Irritability. Vag. Bleeding: None.  Movement: Present. Denies leaking of fluid.   The following portions of the patient's history were reviewed and updated as appropriate: allergies, current medications, past family history, past medical history, past social history, past surgical history and problem list.   Objective:   Vitals:   10/09/22 0935  BP: 112/66  Pulse: 97  Weight: 247 lb (112 kg)    Fetal Status:     Movement: Present     General:  Alert, oriented and cooperative. Patient is in no acute distress.  Skin: Skin is warm and dry. No rash noted.   Cardiovascular: Normal heart rate noted  Respiratory: Normal respiratory effort, no problems with respiration noted  Abdomen: Soft, gravid, appropriate for gestational age.  Pain/Pressure: Present     Pelvic: Cervical exam deferred        Extremities: Normal range of motion.  Edema: Trace  Mental Status: Normal mood and affect. Normal behavior. Normal judgment and thought content.   Assessment and Plan:  Pregnancy: G3P1011 at [redacted]w[redacted]d 1. Supervision of other normal pregnancy, antepartum   2. Obesity in pregnancy - normal   3. Anemia during pregnancy in third trimester - on iron daily  Preterm labor symptoms and general obstetric precautions including but not limited to vaginal bleeding, contractions, leaking of fluid and fetal movement were reviewed in detail with the patient. Please refer to After Visit Summary for  other counseling recommendations.   Return in about 2 weeks (around 10/23/2022) for GBS at next visit.  No future appointments.  Allie Bossier, MD

## 2022-10-13 ENCOUNTER — Telehealth: Payer: Self-pay

## 2022-10-13 NOTE — Telephone Encounter (Cosign Needed Addendum)
Pt called after hour nurse 10/12/22 9:50pm c/o ctxs q52minutes; Is 34wks; caller reports they started at 8:15pm then more frequent; no bleeding and reports not sure on d/c. Was adv by the after hour nurse to go to L&D.  It doesn't look like pt went.  [phone]  UTR

## 2022-10-15 NOTE — Telephone Encounter (Signed)
Pt states [facility] called her later that night and adv her to drink more water and stay hydrated;  Pt states she did drink more water; the cntxs subsided and she went back to sleep and woke up feeling better.  Pt aware she will be having ctxs; we just don't want her to have more than four an hour.

## 2022-10-20 ENCOUNTER — Encounter: Payer: 59 | Admitting: Obstetrics

## 2022-10-20 DIAGNOSIS — Z3480 Encounter for supervision of other normal pregnancy, unspecified trimester: Secondary | ICD-10-CM

## 2022-10-20 DIAGNOSIS — Z3685 Encounter for antenatal screening for Streptococcus B: Secondary | ICD-10-CM

## 2022-10-22 ENCOUNTER — Other Ambulatory Visit (HOSPITAL_COMMUNITY)
Admission: RE | Admit: 2022-10-22 | Discharge: 2022-10-22 | Disposition: A | Discharge status: Home / Self Care | Payer: 59 | Source: Ambulatory Visit | Attending: Obstetrics | Admitting: Obstetrics

## 2022-10-22 ENCOUNTER — Ambulatory Visit (INDEPENDENT_AMBULATORY_CARE_PROVIDER_SITE_OTHER): Payer: 59 | Admitting: Medical

## 2022-10-22 ENCOUNTER — Encounter: Payer: Self-pay | Admitting: Medical

## 2022-10-22 VITALS — BP 114/74 | HR 85 | Wt 251.6 lb

## 2022-10-22 DIAGNOSIS — Z3685 Encounter for antenatal screening for Streptococcus B: Secondary | ICD-10-CM

## 2022-10-22 DIAGNOSIS — D649 Anemia, unspecified: Secondary | ICD-10-CM

## 2022-10-22 DIAGNOSIS — Z3480 Encounter for supervision of other normal pregnancy, unspecified trimester: Secondary | ICD-10-CM | POA: Insufficient documentation

## 2022-10-22 DIAGNOSIS — Z113 Encounter for screening for infections with a predominantly sexual mode of transmission: Secondary | ICD-10-CM

## 2022-10-22 DIAGNOSIS — Z3A35 35 weeks gestation of pregnancy: Secondary | ICD-10-CM

## 2022-10-22 DIAGNOSIS — O99013 Anemia complicating pregnancy, third trimester: Secondary | ICD-10-CM

## 2022-10-22 LAB — POCT URINALYSIS DIPSTICK OB
Bilirubin, UA: NEGATIVE
Blood, UA: NEGATIVE
Glucose, UA: NEGATIVE
Ketones, UA: NEGATIVE
Leukocytes, UA: NEGATIVE
Nitrite, UA: NEGATIVE
POC,PROTEIN,UA: NEGATIVE
Spec Grav, UA: 1.015 (ref 1.010–1.025)
Urobilinogen, UA: 0.2 E.U./dL
pH, UA: 6 (ref 5.0–8.0)

## 2022-10-22 NOTE — Progress Notes (Signed)
   PRENATAL VISIT NOTE  Subjective:  Olivia Frost is a 32 y.o. G3P1011 at [redacted]w[redacted]d being seen today for ongoing prenatal care.  She is currently monitored for the following issues for this low-risk pregnancy and has Supervision of other normal pregnancy, antepartum; Obesity in pregnancy; and Anemia during pregnancy in third trimester on their problem list.  Patient reports occasional contractions.  Contractions: Irritability. Vag. Bleeding: None.  Movement: Present. Denies leaking of fluid.   The following portions of the patient's history were reviewed and updated as appropriate: allergies, current medications, past family history, past medical history, past social history, past surgical history and problem list.   Objective:   Vitals:   10/22/22 1504  BP: 114/74  Pulse: 85  Weight: 251 lb 9.6 oz (114.1 kg)    Fetal Status: Fetal Heart Rate (bpm): 150 Fundal Height: 36 cm Movement: Present     General:  Alert, oriented and cooperative. Patient is in no acute distress.  Skin: Skin is warm and dry. No rash noted.   Cardiovascular: Normal heart rate noted  Respiratory: Normal respiratory effort, no problems with respiration noted  Abdomen: Soft, gravid, appropriate for gestational age.  Pain/Pressure: Present (pelvic)     Pelvic: Cervical exam deferred        Extremities: Normal range of motion.     Mental Status: Normal mood and affect. Normal behavior. Normal judgment and thought content.   Assessment and Plan:  Pregnancy: G3P1011 at [redacted]w[redacted]d 1. Supervision of other normal pregnancy, antepartum - POC Urinalysis Dipstick OB - Cervicovaginal ancillary only - Strep Gp B NAA - Unsure MOC plan  2. Screening for STDs (sexually transmitted diseases) - Cervicovaginal ancillary only  3. Encounter for antenatal screening for Streptococcus B - Strep Gp B NAA  4. Anemia during pregnancy in third trimester - Infusion ordered   5. [redacted] weeks gestation of pregnancy  Preterm labor  symptoms and general obstetric precautions including but not limited to vaginal bleeding, contractions, leaking of fluid and fetal movement were reviewed in detail with the patient. Please refer to After Visit Summary for other counseling recommendations.   Return in about 1 week (around 10/29/2022) for LOB, In-Person, any provider.  Future Appointments  Date Time Provider Department Center  10/29/2022 11:15 AM Dominic, Courtney Heys, CNM [facility] None  11/05/2022 10:35 AM Hildred Laser, MD [facility] None  11/12/2022  8:55 AM Tresea Mall, CNM [facility] None    Vonzella Nipple, PA-C

## 2022-10-24 LAB — STREP GP B NAA: Strep Gp B NAA: NEGATIVE

## 2022-10-26 LAB — CERVICOVAGINAL ANCILLARY ONLY
Chlamydia: NEGATIVE
Comment: NEGATIVE
Comment: NORMAL
Neisseria Gonorrhea: NEGATIVE

## 2022-10-29 ENCOUNTER — Ambulatory Visit (INDEPENDENT_AMBULATORY_CARE_PROVIDER_SITE_OTHER): Payer: 59 | Admitting: Licensed Practical Nurse

## 2022-10-29 ENCOUNTER — Encounter: Payer: Self-pay | Admitting: Licensed Practical Nurse

## 2022-10-29 VITALS — BP 126/79 | HR 97 | Wt 251.8 lb

## 2022-10-29 DIAGNOSIS — Z0289 Encounter for other administrative examinations: Secondary | ICD-10-CM

## 2022-10-29 DIAGNOSIS — Z3A36 36 weeks gestation of pregnancy: Secondary | ICD-10-CM

## 2022-10-29 DIAGNOSIS — Z3480 Encounter for supervision of other normal pregnancy, unspecified trimester: Secondary | ICD-10-CM

## 2022-10-29 DIAGNOSIS — O26843 Uterine size-date discrepancy, third trimester: Secondary | ICD-10-CM

## 2022-10-29 LAB — POCT URINALYSIS DIPSTICK
Bilirubin, UA: NEGATIVE
Blood, UA: NEGATIVE
Glucose, UA: NEGATIVE
Ketones, UA: NEGATIVE
Leukocytes, UA: NEGATIVE
Nitrite, UA: NEGATIVE
Protein, UA: NEGATIVE
Spec Grav, UA: 1.01 (ref 1.010–1.025)
Urobilinogen, UA: 1 E.U./dL
pH, UA: 6 (ref 5.0–8.0)

## 2022-10-29 NOTE — Progress Notes (Signed)
Routine Prenatal Care Visit  Subjective  Olivia Frost is a 32 y.o. G3P1011 at [redacted]w[redacted]d being seen today for ongoing prenatal care.  She is currently monitored for the following issues for this low-risk pregnancy and has Supervision of other normal pregnancy, antepartum; Obesity in pregnancy; and Anemia during pregnancy in third trimester on their problem list.  ----------------------------------------------------------------------------------- Here with TJ  Patient reports  pelvic pressure and pain-support belt is not helping  , swelling in hands and feet. Comfort reassures reviewed  -finds being at work difficult/uncomfortable, would like to work from home for remainder or pregnancy  -has a 7 year at home, her Celine Ahr will watch her while in labor -TJ will have 1 week off, they have a lot of family for PP support -Nursed for 1.5 years, plans to do the same -TWG 33lbs, gained 50 in her last pregnancy,   Contractions: Irritability. Vag. Bleeding: None.  Movement: Present. Leaking Fluid denies.  ----------------------------------------------------------------------------------- The following portions of the patient's history were reviewed and updated as appropriate: allergies, current medications, past family history, past medical history, past social history, past surgical history and problem list. Problem list updated.  Objective  Blood pressure 126/79, pulse 97, weight 251 lb 12.8 oz (114.2 kg), last menstrual period 02/19/2022. Pregravid weight 218 lb (98.9 kg) Total Weight Gain 33 lb 12.8 oz (15.3 kg) Urinalysis: Urine Protein    Urine Glucose    Fetal Status: Fetal Heart Rate (bpm): 146 Fundal Height: 39 cm Movement: Present     General:  Alert, oriented and cooperative. Patient is in no acute distress.  Skin: Skin is warm and dry. No rash noted.   Cardiovascular: Normal heart rate noted  Respiratory: Normal respiratory effort, no problems with respiration noted  Abdomen: Soft, gravid,  appropriate for gestational age. Pain/Pressure: Present     Pelvic:  Cervical exam deferred        Extremities: Normal range of motion.     Mental Status: Normal mood and affect. Normal behavior. Normal judgment and thought content.   Assessment   32 y.o. [id number] at [redacted]w[redacted]d by  11/21/2022, by Patient Reported presenting for routine prenatal visit  Plan   third Problems (from 04/22/22 to present)     No problems associated with this episode.        Preterm labor symptoms and general obstetric precautions including but not limited to vaginal bleeding, contractions, leaking of fluid and fetal movement were reviewed in detail with the patient. Please refer to After Visit Summary for other counseling recommendations.   Return in about 1 week (around 11/05/2022) for ROB.  [place] ordered for S>D Letter for work to work from home given   Carie Caddy, [employer]  [facility]  10/29/22  11:50 AM

## 2022-11-02 ENCOUNTER — Other Ambulatory Visit: Payer: Self-pay

## 2022-11-02 ENCOUNTER — Telehealth: Payer: Self-pay | Admitting: Obstetrics and Gynecology

## 2022-11-02 DIAGNOSIS — O26843 Uterine size-date discrepancy, third trimester: Secondary | ICD-10-CM

## 2022-11-02 NOTE — Telephone Encounter (Signed)
X 2. I tried reach the patient via phone. NO answer /Call couldn't be completed as dialed.

## 2022-11-02 NOTE — Progress Notes (Signed)
Anatomy U/S order placed due to patient having to be rescheduled outpatient.

## 2022-11-02 NOTE — Telephone Encounter (Signed)
I contacted the patient via phone. The ultrasound tech out is out I wasn't able to leave voicemail / No answer about needing to rescheduled. No openings this week for ultrasound.    

## 2022-11-03 ENCOUNTER — Ambulatory Visit
Admission: RE | Admit: 2022-11-03 | Discharge: 2022-11-03 | Disposition: A | Discharge status: Home / Self Care | Payer: 59 | Source: Ambulatory Visit | Attending: Licensed Practical Nurse | Admitting: Licensed Practical Nurse

## 2022-11-03 ENCOUNTER — Other Ambulatory Visit: Payer: 59

## 2022-11-03 DIAGNOSIS — O26843 Uterine size-date discrepancy, third trimester: Secondary | ICD-10-CM | POA: Insufficient documentation

## 2022-11-04 ENCOUNTER — Telehealth: Payer: Self-pay | Admitting: Obstetrics and Gynecology

## 2022-11-04 NOTE — Telephone Encounter (Signed)
The patient called back and confirmed 

## 2022-11-04 NOTE — Telephone Encounter (Signed)
I contacted the patient via phone X2 to reschedule appointment for 5/9 due to a scheduling adjustment. We were going to offer 9:15. Call not be completed as dialed.

## 2022-11-05 ENCOUNTER — Ambulatory Visit (INDEPENDENT_AMBULATORY_CARE_PROVIDER_SITE_OTHER): Payer: 59 | Admitting: Obstetrics and Gynecology

## 2022-11-05 VITALS — BP 106/72 | HR 92 | Wt 255.0 lb

## 2022-11-05 DIAGNOSIS — Z3480 Encounter for supervision of other normal pregnancy, unspecified trimester: Secondary | ICD-10-CM

## 2022-11-05 DIAGNOSIS — Z3A37 37 weeks gestation of pregnancy: Secondary | ICD-10-CM

## 2022-11-05 DIAGNOSIS — D649 Anemia, unspecified: Secondary | ICD-10-CM

## 2022-11-05 DIAGNOSIS — O99213 Obesity complicating pregnancy, third trimester: Secondary | ICD-10-CM

## 2022-11-05 DIAGNOSIS — E669 Obesity, unspecified: Secondary | ICD-10-CM

## 2022-11-05 DIAGNOSIS — O99210 Obesity complicating pregnancy, unspecified trimester: Secondary | ICD-10-CM

## 2022-11-05 DIAGNOSIS — Z3482 Encounter for supervision of other normal pregnancy, second trimester: Secondary | ICD-10-CM

## 2022-11-05 DIAGNOSIS — O99013 Anemia complicating pregnancy, third trimester: Secondary | ICD-10-CM

## 2022-11-05 LAB — POCT URINALYSIS DIPSTICK OB
Bilirubin, UA: NEGATIVE
Blood, UA: NEGATIVE
Glucose, UA: NEGATIVE
Ketones, UA: NEGATIVE
Leukocytes, UA: NEGATIVE
Nitrite, UA: NEGATIVE
POC,PROTEIN,UA: NEGATIVE
Spec Grav, UA: 1.015 (ref 1.010–1.025)
Urobilinogen, UA: 0.2 E.U./dL
pH, UA: 7 (ref 5.0–8.0)

## 2022-11-05 NOTE — Progress Notes (Signed)
ROB: Patient is a 32 y.o. G3P1011 at [redacted]w[redacted]d who presents for routine OB care.  Pregnancy is complicated by Obesity in pregnancy; and Anemia during pregnancy.  Patient denies complaints. Was seen last week and had growth [place] for S>D, US performed 5/7, notes EFW 54%ile. Patient noting more pelvic pressure, desires cervical check today. Discussed labor precautions. RTC in 1 week.

## 2022-11-05 NOTE — Patient Instructions (Signed)
Natural Cervical Ripening Supplements ? ?Red Raspberry Leaf Tea ?2-4 cups daily.  Start at [redacted] weeks gestation and continue until labor.  ? ? ? ?Evening Primrose Oil (1000 mg) ?Take twice daily - by mouth in the morning and vaginally at bedtime. (If previous C-section, take both doses by mouth) ?Start at 38 weeks and continue until labor ? ? ? ?Blue Cohosh ?1 capsule daily starting at [redacted] weeks gestation ?2 capsules daily starting at [redacted] weeks gestation ?3 capsules daily starting at [redacted] weeks gestation ? ?

## 2022-11-10 ENCOUNTER — Telehealth: Payer: Self-pay

## 2022-11-10 NOTE — Telephone Encounter (Signed)
Pt calling; wants a call back.  [phone] Pt states it's like all her pelvic muscles have locked up; very painful to get out of bed and walk; GFM; no leaking of fluid or spotting.  Adv pt will get back to her.  Pt aware per LMD that she doesn't need to go to L&D; may take a warm bath or shower and take tylenol; if concerned to come into the office; okay to wait on appt on the 16th.  Pt states she thinks she will be okay to wait for appt.  Adv pt if water breaks, has vaginal bleeding, pain so bad she cannot talk thru it; ctxs q5-49min apart for an hour to go to L&D.  Pt voices understanding.

## 2022-11-12 ENCOUNTER — Ambulatory Visit (INDEPENDENT_AMBULATORY_CARE_PROVIDER_SITE_OTHER): Payer: 59 | Admitting: Advanced Practice Midwife

## 2022-11-12 ENCOUNTER — Encounter: Payer: Self-pay | Admitting: Advanced Practice Midwife

## 2022-11-12 ENCOUNTER — Other Ambulatory Visit: Payer: 59

## 2022-11-12 VITALS — BP 123/93 | HR 86 | Wt 253.0 lb

## 2022-11-12 DIAGNOSIS — Z3A38 38 weeks gestation of pregnancy: Secondary | ICD-10-CM

## 2022-11-12 DIAGNOSIS — Z3480 Encounter for supervision of other normal pregnancy, unspecified trimester: Secondary | ICD-10-CM

## 2022-11-12 DIAGNOSIS — Z3483 Encounter for supervision of other normal pregnancy, third trimester: Secondary | ICD-10-CM

## 2022-11-12 LAB — POCT URINALYSIS DIPSTICK OB
Bilirubin, UA: NEGATIVE
Blood, UA: NEGATIVE
Glucose, UA: NEGATIVE
Ketones, UA: NEGATIVE
Leukocytes, UA: NEGATIVE
Nitrite, UA: NEGATIVE
POC,PROTEIN,UA: NEGATIVE
Spec Grav, UA: 1.01 (ref 1.010–1.025)
Urobilinogen, UA: 1 E.U./dL
pH, UA: 6 (ref 5.0–8.0)

## 2022-11-12 NOTE — Addendum Note (Signed)
Addended by: Cornelius Moras D on: 11/12/2022 09:53 AM   Modules accepted: Orders

## 2022-11-12 NOTE — Progress Notes (Signed)
Routine Prenatal Care Visit  Subjective  Olivia Frost is a 32 y.o. G3P1011 at [redacted]w[redacted]d being seen today for ongoing prenatal care.  She is currently monitored for the following issues for this low-risk pregnancy and has Supervision of other normal pregnancy, antepartum; Obesity in pregnancy; and Anemia during pregnancy in third trimester on their problem list.  ----------------------------------------------------------------------------------- Patient reports  being generally uncomfortable and ready for baby. Reviewed recommended labor stimulating methods .  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 There is no evidence of HSV lab. The diagnosis is associated with CBC results in [facility] chart. Patient accompanied by SO. Unable to interview regarding possible HSV.  Contractions: Irregular. Vag. Bleeding: None.  Movement: Present. Leaking Fluid denies.  ----------------------------------------------------------------------------------- The following portions of the patient's history were reviewed and updated as appropriate: allergies, current medications, past family history, past medical history, past social history, past surgical history and problem list. Problem list updated.  Objective  Blood pressure (!) 123/93, pulse 86, weight 253 lb (114.8 kg), last menstrual period 02/19/2022. Pregravid weight 218 lb (98.9 kg) Total Weight Gain 35 lb (15.9 kg) Urinalysis: Urine Protein    Urine Glucose    Fetal Status: Fetal Heart Rate (bpm): 149 Fundal Height: 42 cm Movement: Present     General:  Alert, oriented and cooperative. Patient is in no acute distress.  Skin: Skin is warm and dry. No rash noted.   Cardiovascular: Normal heart rate noted  Respiratory: Normal respiratory effort, no problems with respiration noted  Abdomen: Soft, gravid, appropriate for gestational age. Pain/Pressure: Present     Pelvic:   Cervical exam attempted- posterior       No evidence of herpes  outbreak  Extremities: Normal range of motion.  Edema: None  Mental Status: Normal mood and affect. Normal behavior. Normal judgment and thought content.   Assessment   32 y.o. G3P1011 at [redacted]w[redacted]d by  11/21/2022, by Patient Reported presenting for routine prenatal visit  Plan   third Problems (from 04/22/22 to present)     Problem Noted Resolved   Supervision of other normal pregnancy, antepartum 04/22/2022 by Loran Senters, CMA No   Overview Addendum 11/03/2022  4:24 PM by Tommie Raymond, CMA     Clinical Staff Provider  Office Location  [facility] Dating  11/21/2022, by Patient Reported   Language  English Anatomy US  Normal   Flu Vaccine  offer Genetic Screen  NIPS: Negative  TDaP vaccine   09/03/22 Hgb A1C or  GTT Early : Third trimester : 35  Covid Has booster   LAB RESULTS   Rhogam  B/Positive/-- (10/31 1610)  Blood Type B/Positive/-- (10/31 9604)   Feeding Plan breast Antibody Negative (10/31 0833)  Contraception undecided Rubella 1.95 (10/31 5409)  Circumcision NA RPR Non Reactive (03/07 0942)   Pediatrician  [facility] HBsAg Negative (10/31 0833)   Support Person P.J.; Mom HIV Non Reactive (03/07 8119)  Prenatal Classes no Varicella     GBS  Negative  BTL Consent NA Hep C Non Reactive (10/31 1478)   VBAC Consent NA Pap Diagnosis  Date Value Ref Range Status  02/13/2021   Final   - Negative for intraepithelial lesion or malignancy (NILM)      Hgb Electro      CF      SMA                   Term labor symptoms and general obstetric precautions including  but not limited to vaginal bleeding, contractions, leaking of fluid and fetal movement were reviewed in detail with the patient. Please refer to After Visit Summary for other counseling recommendations.   Return in about 1 week (around 11/19/2022) for rob.  Tresea Mall, CNM 11/12/2022 9:46 AM

## 2022-11-17 ENCOUNTER — Encounter: Payer: Self-pay | Admitting: Advanced Practice Midwife

## 2022-11-17 ENCOUNTER — Telehealth: Payer: Self-pay

## 2022-11-17 ENCOUNTER — Ambulatory Visit (INDEPENDENT_AMBULATORY_CARE_PROVIDER_SITE_OTHER): Payer: 59 | Admitting: Advanced Practice Midwife

## 2022-11-17 VITALS — BP 129/83 | HR 87 | Wt 253.0 lb

## 2022-11-17 DIAGNOSIS — Z3483 Encounter for supervision of other normal pregnancy, third trimester: Secondary | ICD-10-CM

## 2022-11-17 DIAGNOSIS — O480 Post-term pregnancy: Secondary | ICD-10-CM

## 2022-11-17 DIAGNOSIS — Z3480 Encounter for supervision of other normal pregnancy, unspecified trimester: Secondary | ICD-10-CM

## 2022-11-17 DIAGNOSIS — Z3A39 39 weeks gestation of pregnancy: Secondary | ICD-10-CM

## 2022-11-17 DIAGNOSIS — Z369 Encounter for antenatal screening, unspecified: Secondary | ICD-10-CM

## 2022-11-17 NOTE — Progress Notes (Signed)
Routine Prenatal Care Visit  Subjective  Olivia Frost is a 32 y.o. G3P1011 at [redacted]w[redacted]d being seen today for ongoing prenatal care.  She is currently monitored for the following issues for this low-risk pregnancy and has Supervision of other normal pregnancy, antepartum; Obesity in pregnancy; and Anemia during pregnancy in third trimester on their problem list.  ----------------------------------------------------------------------------------- Patient reports  general discomforts. She is aware that post-dates IOL will be scheduled at next visit .   Contractions: Irregular. Vag. Bleeding: None.  Movement: Present. Leaking Fluid denies.  ----------------------------------------------------------------------------------- The following portions of the patient's history were reviewed and updated as appropriate: allergies, current medications, past family history, past medical history, past social history, past surgical history and problem list. Problem list updated.  Objective  Blood pressure 129/83, pulse 87, weight 253 lb (114.8 kg), last menstrual period 02/19/2022. Pregravid weight 218 lb (98.9 kg) Total Weight Gain 35 lb (15.9 kg) Urinalysis: Urine Protein    Urine Glucose    Fetal Status: Fetal Heart Rate (bpm): 152   Movement: Present     General:  Alert, oriented and cooperative. Patient is in no acute distress.  Skin: Skin is warm and dry. No rash noted.   Cardiovascular: Normal heart rate noted  Respiratory: Normal respiratory effort, no problems with respiration noted  Abdomen: Soft, gravid, appropriate for gestational age. Pain/Pressure: Present     Pelvic:  Cervical exam performed Dilation: Fingertip Effacement (%): 40, 50 exam limited by posterior cervix   Extremities: Normal range of motion.  Edema: None  Mental Status: Normal mood and affect. Normal behavior. Normal judgment and thought content.   Assessment   32 y.o. G3P1011 at [redacted]w[redacted]d by  11/21/2022, by Patient Reported  presenting for routine prenatal visit  Plan   third Problems (from 04/22/22 to present)     Problem Noted Resolved   Supervision of other normal pregnancy, antepartum 04/22/2022 by Loran Senters, CMA No   Overview Addendum 11/03/2022  4:24 PM by Tommie Raymond, CMA     Clinical Staff Provider  Office Location  [facility] Dating  11/21/2022, by Patient Reported   Language  English Anatomy US  Normal   Flu Vaccine  offer Genetic Screen  NIPS: Negative  TDaP vaccine   09/03/22 Hgb A1C or  GTT Early : Third trimester : 19  Covid Has booster   LAB RESULTS   Rhogam  B/Positive/-- (10/31 4098)  Blood Type B/Positive/-- (10/31 1191)   Feeding Plan breast Antibody Negative (10/31 0833)  Contraception undecided Rubella 1.95 (10/31 4782)  Circumcision NA RPR Non Reactive (03/07 0942)   Pediatrician  [facility] HBsAg Negative (10/31 9562)   Support Person P.J.; Mom HIV Non Reactive (03/07 1308)  Prenatal Classes no Varicella     GBS  Negative  BTL Consent NA Hep C Non Reactive (10/31 6578)   VBAC Consent NA Pap Diagnosis  Date Value Ref Range Status  02/13/2021   Final   - Negative for intraepithelial lesion or malignancy (NILM)      Hgb Electro      CF      SMA                   Term labor symptoms and general obstetric precautions including but not limited to vaginal bleeding, contractions, leaking of fluid and fetal movement were reviewed in detail with the patient. Please refer to After Visit Summary for other counseling recommendations.   Return in about 1 week (around 11/24/2022) for  nst and rob.  Tresea Mall, CNM 11/17/2022 3:19 PM

## 2022-11-17 NOTE — Telephone Encounter (Signed)
Pt calling; is 39wks; having pain for 3d now. [phone]  Pt states she is having contractions off an on but not timing them; has pain and pressure; painful to walk/move; had appt moved up to today at 2;55.  Adv pt to count contractions; pt aware to go to L&D if contractions are 5-7 minutes apart for an hour, water breaks, pain so bad she cannot talk thru it and to enter via ER where she will be registered, get armband and sent to L&D.  Otherwise we will see her at 2:55 today.  Pt states sounds like a plan and thankful.

## 2022-11-19 ENCOUNTER — Encounter: Payer: 59 | Admitting: Obstetrics and Gynecology

## 2022-11-19 DIAGNOSIS — Z3A39 39 weeks gestation of pregnancy: Secondary | ICD-10-CM

## 2022-11-19 DIAGNOSIS — Z3480 Encounter for supervision of other normal pregnancy, unspecified trimester: Secondary | ICD-10-CM

## 2022-11-22 ENCOUNTER — Inpatient Hospital Stay (EMERGENCY_DEPARTMENT_HOSPITAL)
Admission: AD | Admit: 2022-11-22 | Discharge: 2022-11-22 | Disposition: A | Discharge status: Home / Self Care | Payer: 59 | Source: Home / Self Care | Attending: Obstetrics & Gynecology | Admitting: Obstetrics & Gynecology

## 2022-11-22 ENCOUNTER — Encounter (HOSPITAL_COMMUNITY): Payer: Self-pay | Admitting: Obstetrics & Gynecology

## 2022-11-22 ENCOUNTER — Other Ambulatory Visit: Payer: Self-pay

## 2022-11-22 DIAGNOSIS — Z3493 Encounter for supervision of normal pregnancy, unspecified, third trimester: Secondary | ICD-10-CM

## 2022-11-22 DIAGNOSIS — O471 False labor at or after 37 completed weeks of gestation: Secondary | ICD-10-CM | POA: Insufficient documentation

## 2022-11-22 DIAGNOSIS — Z3689 Encounter for other specified antenatal screening: Secondary | ICD-10-CM

## 2022-11-22 DIAGNOSIS — Z0371 Encounter for suspected problem with amniotic cavity and membrane ruled out: Secondary | ICD-10-CM | POA: Insufficient documentation

## 2022-11-22 DIAGNOSIS — Z3A40 40 weeks gestation of pregnancy: Secondary | ICD-10-CM | POA: Insufficient documentation

## 2022-11-22 NOTE — MAU Note (Signed)
Pt reports Contractions yesterday 10 min apart lasting 5 hrs. Pt complains of 3 leaking episodes on 5/25 wetting her underwear, no pad needed. Small leaking today "on and off" no pad needed. Contractions today are not regular. Reports good fetal movement. No vag bleeding.

## 2022-11-22 NOTE — MAU Provider Note (Signed)
Event Date/Time  First Provider Initiated Contact with Patient 11/22/22 1623     S: Ms. Olivia Frost is a 32 y.o. G3P1011 at [redacted]w[redacted]d  who presents to MAU complaining of leaking of fluid yesterday x 3 episodes and throughout the day today. She has not donned a pad and is not seeing fluid on her clothing. She endorses contractions q 10 minutes.She denies vaginal bleeding.  She reports normal fetal movement.  Sexual intercourse yesterday  Patient receives care with [facility] but plans to deliver at [facility]  O: BP 130/87   Pulse (!) 102   Temp 98.3 F (36.8 C) (Oral)   Resp 18   LMP 02/19/2022 (Approximate)   SpO2 97%  GENERAL: Well-developed, well-nourished female in no acute distress.  HEAD: Normocephalic, atraumatic.  CHEST: Normal effort of breathing, regular heart rate ABDOMEN: Soft, nontender, gravid PELVIC: Normal external female genitalia. Vagina is pink and rugated. Cervix with normal contour, no lesions. Normal discharge.  Negative pooling.   Cervical exam:  Dilation: Fingertip Exam by:: Sam CNM   Fetal Monitoring: Baseline: 140 Variability: Mod Accelerations: 15 x 15 Decelerations: None Contractions: Irregular, palpate mild, uterine irritability  A: SIUP at [redacted]w[redacted]d  Membranes intact (negative pooling, negative fern) Cat I tracing Cervix remains FT/thick/ballotable, unchanged from 11/17/2022 Patient declines labor check (recheck of cervix in 60-90 min)  P: Discharge home in stable condition with labor precautions  F/U: --Confirmed with patient [facility] will not be able to honor an IOL scheduled at [facility] but she is welcome to return to MAU as a labor check or for evaluation in the event of DFM, LOF, vaginal bleeding or other emergency  --Confirmed [facility] has a [facility] location in [place] for ongoing outpatient OB or GYN needs  Clayton Bibles, MSA, MSN, CNM 11/22/2022 5:26 PM

## 2022-11-24 ENCOUNTER — Telehealth: Payer: Self-pay

## 2022-11-24 NOTE — Telephone Encounter (Signed)
Pt calling; has just lost her mucus plug; what to expect?  [phone]  Adv pt that just b/c she lost her mucus doesn't mean immediate labor.  Pt states her contractions are 10 minutes apart right now.  Pt aware to go to L&D via ER if water breaks, contractions are 5-7 minutes apart for an hour, or if the pain is so bad she cannot talk thru it.  Pt voices understanding and is appreciative.

## 2022-11-25 ENCOUNTER — Encounter (HOSPITAL_COMMUNITY): Payer: Self-pay | Admitting: Obstetrics and Gynecology

## 2022-11-25 ENCOUNTER — Other Ambulatory Visit: Payer: Self-pay

## 2022-11-25 ENCOUNTER — Inpatient Hospital Stay (HOSPITAL_COMMUNITY): Payer: 59 | Admitting: Anesthesiology

## 2022-11-25 ENCOUNTER — Inpatient Hospital Stay (HOSPITAL_COMMUNITY)
Admission: AD | Admit: 2022-11-25 | Discharge: 2022-11-26 | DRG: 806 | Disposition: A | Discharge status: Home / Self Care | Payer: 59 | Attending: Obstetrics and Gynecology | Admitting: Obstetrics and Gynecology

## 2022-11-25 DIAGNOSIS — Z3480 Encounter for supervision of other normal pregnancy, unspecified trimester: Secondary | ICD-10-CM

## 2022-11-25 DIAGNOSIS — O9902 Anemia complicating childbirth: Secondary | ICD-10-CM | POA: Diagnosis present

## 2022-11-25 DIAGNOSIS — O99210 Obesity complicating pregnancy, unspecified trimester: Secondary | ICD-10-CM | POA: Diagnosis present

## 2022-11-25 DIAGNOSIS — O471 False labor at or after 37 completed weeks of gestation: Secondary | ICD-10-CM | POA: Diagnosis present

## 2022-11-25 DIAGNOSIS — Z8616 Personal history of COVID-19: Secondary | ICD-10-CM

## 2022-11-25 DIAGNOSIS — O99214 Obesity complicating childbirth: Secondary | ICD-10-CM | POA: Diagnosis present

## 2022-11-25 DIAGNOSIS — O99013 Anemia complicating pregnancy, third trimester: Secondary | ICD-10-CM | POA: Diagnosis present

## 2022-11-25 DIAGNOSIS — O480 Post-term pregnancy: Secondary | ICD-10-CM | POA: Diagnosis present

## 2022-11-25 DIAGNOSIS — O368390 Maternal care for abnormalities of the fetal heart rate or rhythm, unspecified trimester, not applicable or unspecified: Secondary | ICD-10-CM | POA: Diagnosis present

## 2022-11-25 DIAGNOSIS — O368330 Maternal care for abnormalities of the fetal heart rate or rhythm, third trimester, not applicable or unspecified: Secondary | ICD-10-CM

## 2022-11-25 DIAGNOSIS — Z0371 Encounter for suspected problem with amniotic cavity and membrane ruled out: Secondary | ICD-10-CM

## 2022-11-25 DIAGNOSIS — Z3A40 40 weeks gestation of pregnancy: Secondary | ICD-10-CM

## 2022-11-25 LAB — CBC
HCT: 31.9 % — ABNORMAL LOW (ref 36.0–46.0)
Hemoglobin: 10.4 g/dL — ABNORMAL LOW (ref 12.0–15.0)
MCH: 27.4 pg (ref 26.0–34.0)
MCHC: 32.6 g/dL (ref 30.0–36.0)
MCV: 84.2 fL (ref 80.0–100.0)
Platelets: 304 10*3/uL (ref 150–400)
RBC: 3.79 MIL/uL — ABNORMAL LOW (ref 3.87–5.11)
RDW: 15.9 % — ABNORMAL HIGH (ref 11.5–15.5)
WBC: 10 10*3/uL (ref 4.0–10.5)
nRBC: 0 % (ref 0.0–0.2)

## 2022-11-25 LAB — RPR: RPR Ser Ql: NONREACTIVE

## 2022-11-25 LAB — TYPE AND SCREEN: ABO/RH(D): B POS

## 2022-11-25 MED ORDER — IBUPROFEN 600 MG PO TABS
600.0000 mg | ORAL_TABLET | Freq: Four times a day (QID) | ORAL | Status: DC
  Administered 2022-11-25 – 2022-11-26 (×3): 600 mg via ORAL
  Filled 2022-11-25 (×4): qty 1

## 2022-11-25 MED ORDER — LIDOCAINE-EPINEPHRINE (PF) 2 %-1:200000 IJ SOLN
INTRAMUSCULAR | Status: DC | PRN
  Administered 2022-11-25: 5 mL via EPIDURAL

## 2022-11-25 MED ORDER — ONDANSETRON HCL 4 MG/2ML IJ SOLN: 4.0000 mg | Freq: Four times a day (QID) | INTRAMUSCULAR | Status: DC | PRN

## 2022-11-25 MED ORDER — ZOLPIDEM TARTRATE 5 MG PO TABS: 5.0000 mg | ORAL_TABLET | Freq: Every evening | ORAL | Status: DC | PRN

## 2022-11-25 MED ORDER — LACTATED RINGERS IV SOLN: INTRAVENOUS | Status: DC

## 2022-11-25 MED ORDER — ONDANSETRON HCL 4 MG/2ML IJ SOLN: 4.0000 mg | INTRAMUSCULAR | Status: DC | PRN

## 2022-11-25 MED ORDER — LIDOCAINE HCL (PF) 1 % IJ SOLN: 30.0000 mL | INTRAMUSCULAR | Status: DC | PRN

## 2022-11-25 MED ORDER — FENTANYL-BUPIVACAINE-NACL 0.5-0.125-0.9 MG/250ML-% EP SOLN
12.0000 mL/h | EPIDURAL | Status: DC | PRN
  Administered 2022-11-25: 12 mL/h via EPIDURAL
  Filled 2022-11-25: qty 250

## 2022-11-25 MED ORDER — DIPHENHYDRAMINE HCL 50 MG/ML IJ SOLN: 12.5000 mg | INTRAMUSCULAR | Status: DC | PRN

## 2022-11-25 MED ORDER — PRENATAL MULTIVITAMIN CH
1.0000 | ORAL_TABLET | Freq: Every day | ORAL | Status: DC
  Administered 2022-11-25 – 2022-11-26 (×2): 1 via ORAL
  Filled 2022-11-25 (×2): qty 1

## 2022-11-25 MED ORDER — PHENYLEPHRINE 80 MCG/ML (10ML) SYRINGE FOR IV PUSH (FOR BLOOD PRESSURE SUPPORT): 80.0000 ug | PREFILLED_SYRINGE | INTRAVENOUS | Status: DC | PRN

## 2022-11-25 MED ORDER — ONDANSETRON HCL 4 MG PO TABS: 4.0000 mg | ORAL_TABLET | ORAL | Status: DC | PRN

## 2022-11-25 MED ORDER — ACETAMINOPHEN 325 MG PO TABS: 650.0000 mg | ORAL_TABLET | ORAL | Status: DC | PRN

## 2022-11-25 MED ORDER — MISOPROSTOL 25 MCG QUARTER TABLET: 25.0000 ug | ORAL_TABLET | Freq: Once | ORAL | Status: DC

## 2022-11-25 MED ORDER — LACTATED RINGERS IV SOLN: 500.0000 mL | INTRAVENOUS | Status: DC | PRN

## 2022-11-25 MED ORDER — WITCH HAZEL-GLYCERIN EX PADS: 1.0000 | MEDICATED_PAD | CUTANEOUS | Status: DC | PRN

## 2022-11-25 MED ORDER — ACETAMINOPHEN 325 MG PO TABS
650.0000 mg | ORAL_TABLET | ORAL | Status: DC | PRN
  Administered 2022-11-25: 650 mg via ORAL
  Filled 2022-11-25: qty 2

## 2022-11-25 MED ORDER — SOD CITRATE-CITRIC ACID 500-334 MG/5ML PO SOLN: 30.0000 mL | ORAL | Status: DC | PRN

## 2022-11-25 MED ORDER — OXYTOCIN BOLUS FROM INFUSION
333.0000 mL | Freq: Once | INTRAVENOUS | Status: AC
  Administered 2022-11-25: 333 mL via INTRAVENOUS

## 2022-11-25 MED ORDER — SENNOSIDES-DOCUSATE SODIUM 8.6-50 MG PO TABS
2.0000 | ORAL_TABLET | Freq: Every day | ORAL | Status: DC
  Administered 2022-11-26: 2 via ORAL
  Filled 2022-11-25: qty 2

## 2022-11-25 MED ORDER — FENTANYL CITRATE (PF) 100 MCG/2ML IJ SOLN
100.0000 ug | INTRAMUSCULAR | Status: DC | PRN
  Administered 2022-11-25: 100 ug via INTRAVENOUS
  Filled 2022-11-25: qty 2

## 2022-11-25 MED ORDER — COCONUT OIL OIL: 1.0000 | TOPICAL_OIL | Status: DC | PRN

## 2022-11-25 MED ORDER — TERBUTALINE SULFATE 1 MG/ML IJ SOLN: 0.2500 mg | Freq: Once | INTRAMUSCULAR | Status: DC | PRN

## 2022-11-25 MED ORDER — TETANUS-DIPHTH-ACELL PERTUSSIS 5-2.5-18.5 LF-MCG/0.5 IM SUSY: 0.5000 mL | PREFILLED_SYRINGE | Freq: Once | INTRAMUSCULAR | Status: DC

## 2022-11-25 MED ORDER — DIPHENHYDRAMINE HCL 25 MG PO CAPS: 25.0000 mg | ORAL_CAPSULE | Freq: Four times a day (QID) | ORAL | Status: DC | PRN

## 2022-11-25 MED ORDER — BENZOCAINE-MENTHOL 20-0.5 % EX AERO
1.0000 | INHALATION_SPRAY | CUTANEOUS | Status: DC | PRN
  Administered 2022-11-25: 1 via TOPICAL
  Filled 2022-11-25: qty 56

## 2022-11-25 MED ORDER — DIBUCAINE (PERIANAL) 1 % EX OINT: 1.0000 | TOPICAL_OINTMENT | CUTANEOUS | Status: DC | PRN

## 2022-11-25 MED ORDER — EPHEDRINE 5 MG/ML INJ: 10.0000 mg | INTRAVENOUS | Status: DC | PRN

## 2022-11-25 MED ORDER — SIMETHICONE 80 MG PO CHEW: 80.0000 mg | CHEWABLE_TABLET | ORAL | Status: DC | PRN

## 2022-11-25 MED ORDER — OXYTOCIN-SODIUM CHLORIDE 30-0.9 UT/500ML-% IV SOLN
2.5000 [IU]/h | INTRAVENOUS | Status: DC
  Administered 2022-11-25: 2.5 [IU]/h via INTRAVENOUS
  Filled 2022-11-25: qty 500

## 2022-11-25 MED ORDER — LACTATED RINGERS IV SOLN
500.0000 mL | Freq: Once | INTRAVENOUS | Status: AC
  Administered 2022-11-25: 500 mL via INTRAVENOUS

## 2022-11-25 MED ORDER — BUPIVACAINE HCL (PF) 0.25 % IJ SOLN
INTRAMUSCULAR | Status: DC | PRN
  Administered 2022-11-25 (×2): 4 mL via EPIDURAL

## 2022-11-25 NOTE — Lactation Note (Signed)
This note was copied from a baby's chart. Lactation Consultation Note  Patient Name: Olivia Frost [id number] Date: 11/25/2022 Age:30 hours Reason for consult: Initial assessment;Term.  P2, Birth Parent attempted to latch infant on her right breast using the football hold position, infant did not latch only held nipple in mouth for brief moment was not eliciting the suck and swallow response. Birth Parent was taught hand expression, LC used Breast model and Birth self expressed 3 mls of colostrum that was spoon fed to infant, infant was burped and LC notice that infant had clear mucus in mouth and after a few minutes infant had a large emesis of mucous. Birth Parent is experienced with breastfeeding see maternal data below. Birth Parent will continue to latch infant according to cues, on demand, 8 to 12+ times within 24 hours, skin to skin. Birth Parent will ask for further latch assistance from RN/LC if needed. Birth Parent knows if infant doesn't latch to continue to do hand express and give infant EBM by spoon. LC discussed importance of maternal rest, diet and hydration. Birth Parent was made aware of O/P services, breastfeeding support groups, community resources, and our phone # for post-discharge questions.    Maternal Data Has patient been taught Hand Expression?: Yes Does the patient have breastfeeding experience prior to this delivery?: Yes How long did the patient breastfeed?: Per Birth Parent, she BF her 45 year old for 18 months.  Feeding Mother's Current Feeding Choice: Breast Milk  LATCH Score Latch: Too sleepy or reluctant, no latch achieved, no sucking elicited.  Audible Swallowing: None  Type of Nipple: Everted at rest and after stimulation  Comfort (Breast/Nipple): Soft / non-tender  Hold (Positioning): Assistance needed to correctly position infant at breast and maintain latch.  LATCH Score: 5   Lactation Tools Discussed/Used    Interventions Interventions:  Breast feeding basics reviewed;Adjust position;Assisted with latch;Support pillows;Skin to skin;Position options;Education;Breast compression;Reverse pressure;LC Services brochure  Discharge    Consult Status Consult Status: Follow-up Date: 11/26/22 Follow-up type: In-patient    Frederico Hamman 11/25/2022, 6:16 PM

## 2022-11-25 NOTE — Anesthesia Preprocedure Evaluation (Signed)
Anesthesia Evaluation  Patient identified by MRN, date of birth, ID band Patient awake    Reviewed: Allergy & Precautions, Patient's Chart, lab work & pertinent test results  History of Anesthesia Complications Negative for: history of anesthetic complications  Airway Mallampati: II  TM Distance: >3 FB Neck ROM: Full    Dental  (+) Dental Advisory Given   Pulmonary neg pulmonary ROS   breath sounds clear to auscultation       Cardiovascular negative cardio ROS  Rhythm:Regular     Neuro/Psych negative neurological ROS  negative psych ROS   GI/Hepatic negative GI ROS, Neg liver ROS,,,  Endo/Other  negative endocrine ROS    Renal/GU negative Renal ROS     Musculoskeletal   Abdominal   Peds  Hematology  (+) Blood dyscrasia, anemia Lab Results      Component                Value               Date                      WBC                      10.0                11/25/2022                HGB                      10.4 (L)            11/25/2022                HCT                      31.9 (L)            11/25/2022                MCV                      84.2                11/25/2022                PLT                      304                 11/25/2022              Anesthesia Other Findings   Reproductive/Obstetrics (+) Pregnancy                             Anesthesia Physical Anesthesia Plan  ASA: 2  Anesthesia Plan: Epidural   Post-op Pain Management:    Induction:   PONV Risk Score and Plan: 2 and Treatment may vary due to age or medical condition  Airway Management Planned: Natural Airway  Additional Equipment: None  Intra-op Plan:   Post-operative Plan:   Informed Consent: I have reviewed the patients History and Physical, chart, labs and discussed the procedure including the risks, benefits and alternatives for the proposed anesthesia with the patient or authorized  representative who has indicated his/her understanding and acceptance.  Plan Discussed with: Anesthesiologist  Anesthesia Plan Comments:        Anesthesia Quick Evaluation

## 2022-11-25 NOTE — MAU Note (Signed)
Pt informed that the ultrasound is considered a limited OB ultrasound and is not intended to be a complete ultrasound exam.  Patient also informed that the ultrasound is not being completed with the intent of assessing for fetal or placental anomalies or any pelvic abnormalities.  Explained that the purpose of today's ultrasound is to assess for  presentation.  Patient acknowledges the purpose of the exam and the limitations of the study.    Vertex presentation verified.  

## 2022-11-25 NOTE — H&P (Signed)
LABOR AND DELIVERY ADMISSION HISTORY AND PHYSICAL NOTE  Olivia Frost is a 32 y.o. female G71P1011 with IUP at [redacted]w[redacted]d by LMP presenting for IOL secondary to non-reactive NST in MAU. She endorses recurrent, painful contractions. She reports positive fetal movement. She denies leakage of fluid, vaginal bleeding.  She plans on breast feeding. Her contraception plan is: undecied.  Prenatal History/Complications: PNC at [facility]:  @[redacted]w[redacted]d , CWD, normal anatomy, cephalic presentation, anterior placenta, 54%ile, EFW 3158g  Pregnancy complications:  - Anemia in pregnancy - Maternal obesity  Past Medical History: Past Medical History:  Diagnosis Date   COVID 03/26/2022    Past Surgical History: Past Surgical History:  Procedure Laterality Date   WRIST SURGERY Left     Obstetrical History: OB History     Gravida  3   Para  1   Term  1   Preterm  0   AB  1   Living  1      SAB      IAB      Ectopic      Multiple  0   Live Births  1           Social History: Social History   Socioeconomic History   Marital status: Significant Other    Spouse name: Not on file   Number of children: 1   Years of education: 16   Highest education level: Not on file  Occupational History   Occupation: proposal Chief Executive Officer  Tobacco Use   Smoking status: Never   Smokeless tobacco: Never  Vaping Use   Vaping Use: Never used  Substance and Sexual Activity   Alcohol use: Not Currently   Drug use: No   Sexual activity: Yes    Partners: Male    Birth control/protection: None  Other Topics Concern   Not on file  Social History Narrative   Not on file   Social Determinants of Health   Financial Resource Strain: Low Risk  (04/22/2022)   Overall Financial Resource Strain (CARDIA)    Difficulty of Paying Living Expenses: Not hard at all  Food Insecurity: No Food Insecurity (04/22/2022)   Hunger Vital Sign    Worried About Running Out of Food in the  Last Year: Never true    Ran Out of Food in the Last Year: Never true  Transportation Needs: No Transportation Needs (04/22/2022)   PRAPARE - Administrator, Civil Service (Medical): No    Lack of Transportation (Non-Medical): No  Physical Activity: Insufficiently Active (04/22/2022)   Exercise Vital Sign    Days of Exercise per Week: 2 days    Minutes of Exercise per Session: 10 min  Stress: No Stress Concern Present (04/22/2022)   [employer] of Occupational Health - Occupational Stress Questionnaire    Feeling of Stress : Not at all  Social Connections: Moderately Isolated (04/22/2022)   Social Connection and Isolation Panel [NHANES]    Frequency of Communication with Friends and Family: More than three times a week    Frequency of Social Gatherings with Friends and Family: Three times a week    Attends Religious Services: Never    Active Member of Clubs or Organizations: No    Attends Banker Meetings: Never    Marital Status: Living with partner    Family History: Family History  Problem Relation Age of Onset   Anxiety disorder Sister    Depression Sister    Breast cancer Maternal Grandmother  70   Sickle cell anemia Paternal Grandmother    Breast cancer Paternal Great-grandmother    Lung cancer Paternal Great-grandmother    Throat cancer Paternal Great-grandmother     Allergies: Allergies  Allergen Reactions   Latex Rash    Medications Prior to Admission  Medication Sig Dispense Refill Last Dose   Iron-FA-B Cmp-C-Biot-Probiotic (FUSION PLUS) CAPS Take 1 capsule by mouth daily. 30 capsule 11 11/24/2022   prenatal vitamin w/FE, FA (NATACHEW) 29-1 MG CHEW chewable tablet Chew 1 tablet by mouth daily at 12 noon.   11/24/2022     Review of Systems  All systems reviewed and negative except as stated in HPI  Physical Exam Blood pressure 121/83, pulse 89, temperature 97.9 F (36.6 C), temperature source Oral, resp. rate 18, height 5\' 8"   (1.727 m), weight 117 kg, last menstrual period 02/19/2022, SpO2 99 %. General appearance: alert, oriented Lungs: normal respiratory effort Heart: regular rate Abdomen: soft, non-tender; gravid Extremities: No calf swelling or tenderness Presentation: cephalic by suture per RN exam  Fetal monitoring: Baseline 140, mod var, +10 x 10 accels, no decels Uterine activity: q 3-5 min  Dilation: 2 Effacement (%): 80 Station: -3 Exam by:: Tamera Punt, RN  Prenatal labs: ABO, Rh: B/Positive/-- (10/31 1610) Antibody: Negative (10/31 0833) Rubella: 1.95 (10/31 0833) RPR: Non Reactive (03/07 0942)  HBsAg: Negative (10/31 0833)  HIV: Non Reactive (03/07 0942)  GC/Chlamydia: Neg  GBS: Negative/-- (04/25 1617)  2-hr GTT: WNL Genetic screening:  Low risk female Anatomy US: WNL  Prenatal Transfer Tool  Maternal Diabetes: No Genetic Screening: Normal Maternal Ultrasounds/Referrals: Normal Fetal Ultrasounds or other Referrals:  None Maternal Substance Abuse:  No Significant Maternal Medications:  None Significant Maternal Lab Results: Group B Strep negative  No results found for this or any previous visit (from the past 24 hour(s)).  Patient Active Problem List   Diagnosis Date Noted   Anemia during pregnancy in third trimester 10/09/2022   Obesity in pregnancy 09/03/2022   Supervision of other normal pregnancy, antepartum 04/22/2022    Assessment: Olivia Frost is a 32 y.o. G3P1011 at [redacted]w[redacted]d here for IOL 2/2 NR NST  #Labor: Recurrent painful contractions, requesting  #Pain: IV pain meds PRN, epidural upon request #FWB: Reassuring fetal tracing, 10 x 10 accels #GBS/ID: Neg #MOF: breast #MOC: Undecided #Circ: N/A baby girl  Calvert Cantor, MSA, MSN, CNM 11/25/2022, 7:09 AM

## 2022-11-25 NOTE — Lactation Note (Signed)
This note was copied from a baby's chart. Lactation Consultation Note  Patient Name: Girl Ophelia Mcneary Today's Date: 11/25/2022 Age:30 hours  P2, Birth Parent had visitors in the room will call  LC for latch assistance with the next feeding. [facility] written name on white board in room.   Maternal Data    Feeding    LATCH Score                    Lactation Tools Discussed/Used    Interventions    Discharge    Consult Status      Frederico Hamman 11/25/2022, 4:24 PM

## 2022-11-25 NOTE — Progress Notes (Addendum)
Olivia Frost is a 32 y.o. G3P1011 at [redacted]w[redacted]d admitted for induction of labor due to Non-reactive NST.  Subjective: -just received epidural, is still hurting on right side -plans to try and rest -well supported by partner  Objective: BP 123/80   Pulse 91   Temp 97.9 F (36.6 C) (Oral)   Resp 18   Ht 5\' 8"  (1.727 m)   Wt 117 kg   LMP 02/19/2022 (Approximate)   SpO2 97%   BMI 39.23 kg/m  No intake/output data recorded. No intake/output data recorded.  FHT:  FHR: 150 bpm, variability: moderate,  accelerations:  Present,  decelerations:  Absent UC:   regular, every 2-3 minutes SVE:   Dilation: 8 Effacement (%): 80 Station: 0 Exam by:: [employer]  Labs: Lab Results  Component Value Date   WBC 10.0 11/25/2022   HGB 10.4 (L) 11/25/2022   HCT 31.9 (L) 11/25/2022   MCV 84.2 11/25/2022   PLT 304 11/25/2022    Assessment / Plan: Spontaneous labor, progressing normally, pitocin as needed; expectant mangement  Labor: Progressing normally Preeclampsia:   none Fetal Wellbeing:  Category I Pain Control:  Epidural I/D:  n/a Anticipated MOD:  NSVD  Karis Juba, Student-MidWife 11/25/2022, 8:59 AM  Attestation of CNM Supervision of Midwife Student: Evaluation and management procedures were performed by the midwife student under my supervision. I was immediately available for direct supervision, assistance and direction throughout this encounter.  I also confirm that I have verified the information documented in the resident's note, and that I have also personally reperformed the pertinent components of the physical exam and all of the medical decision making activities.  I have also made any necessary editorial changes.  Brand Males, CNM 11/25/2022 10:23 AM

## 2022-11-25 NOTE — Discharge Summary (Signed)
Postpartum Discharge Summary  Date of Service updated***     Patient Name: Olivia Frost DOB: [birth year] MRN: [id number]  Date of admission: 11/25/2022 Delivery date:11/25/2022  Delivering provider: Karis Juba  Date of discharge: 11/25/2022  Admitting diagnosis: Non-reassuring electronic fetal monitoring tracing [O36.8390] Intrauterine pregnancy: [redacted]w[redacted]d     Secondary diagnosis:  Principal Problem:   Non-reassuring electronic fetal monitoring tracing Active Problems:   SVD (spontaneous vaginal delivery)  Additional problems: ***    Discharge diagnosis: Term Pregnancy Delivered                                              Post partum procedures:{Postpartum procedures:23558} Augmentation: N/A Complications: None  Hospital course: Onset of Labor With Vaginal Delivery      32 y.o. yo [id number] at [redacted]w[redacted]d was admitted in Latent Labor on 11/25/2022. Labor course was complicated by none  Membrane Rupture Time/Date: 10:38 AM ,11/25/2022   Delivery Method:Vaginal, Spontaneous  Episiotomy: None  Lacerations:  Labial  Patient had a postpartum course complicated by ***.  She is ambulating, tolerating a regular diet, passing flatus, and urinating well. Patient is discharged home in stable condition on 11/25/22.  Newborn Data: Birth date:11/25/2022  Birth time:11:31 AM  Gender:Female  Living status:Living  Apgars:9 ,9  Weight:3742 g   Magnesium Sulfate received: {Mag received:30440022} BMZ received: No Rhophylac:N/A MMR:N/A T-DaP:Given prenatally Flu: N/A Transfusion:{Transfusion received:30440034}  Physical exam  Vitals:   11/25/22 1215 11/25/22 1300 11/25/22 1334 11/25/22 1440  BP: 121/67 122/61 (!) 129/90 122/75  Pulse: (!) 114 81 78 83  Resp:   20 20  Temp:   98.5 F (36.9 C) 98.3 F (36.8 C)  TempSrc:   Oral Oral  SpO2:   99%   Weight:      Height:       General: {Exam; general:21111117} Lochia: {Desc;  appropriate/inappropriate:30686::"appropriate"} Uterine Fundus: {Desc; firm/soft:30687} Incision: {Exam; incision:21111123} DVT Evaluation: {Exam; dvt:2111122} Labs: Lab Results  Component Value Date   WBC 10.0 11/25/2022   HGB 10.4 (L) 11/25/2022   HCT 31.9 (L) 11/25/2022   MCV 84.2 11/25/2022   PLT 304 11/25/2022       No data to display         Edinburgh Score:    04/22/2022    2:34 PM  Edinburgh Postnatal Depression Scale Screening Tool  I have been able to laugh and see the funny side of things. 0  I have looked forward with enjoyment to things. 1  I have blamed myself unnecessarily when things went wrong. 0  I have been anxious or worried for no good reason. 0  I have felt scared or panicky for no good reason. 0  Things have been getting on top of me. 2  I have been so unhappy that I have had difficulty sleeping. 0  I have felt sad or miserable. 0  I have been so unhappy that I have been crying. 0  The thought of harming myself has occurred to me. 0  Edinburgh Postnatal Depression Scale Total 3     After visit meds:  Allergies as of 11/25/2022       Reactions   Latex Rash     Med Rec must be completed prior to using this [facility]***        Discharge home in stable condition Infant Feeding: {Baby feeding:23562} Infant  Disposition:{CHL IP OB HOME WITH ZOXWRU:04540} Discharge instruction: per After Visit Summary and Postpartum booklet. Activity: Advance as tolerated. Pelvic rest for 6 weeks.  Diet: {OB JWJX:91478295} Future Appointments:No future appointments. Follow up Visit:  Message sent to AOB on 5/29  Please schedule this patient for a In person postpartum visit in 6 weeks with the following provider: Any provider. Additional Postpartum F/U:{PP Procedure:23957}  Low risk pregnancy complicated by: anemia Delivery mode:  Vaginal, Spontaneous  Anticipated Birth Control:  Unsure   11/25/2022 Brand Males, CNM

## 2022-11-25 NOTE — MAU Note (Signed)
Pt says UC's strong since 2300. PNC- [facility]  [address]- 5-23- FT Was here on Sunday- 1 cm Denies HSV GBS- neg Has an appointment tomorrow - for induction sch. Planned to deliver here

## 2022-11-25 NOTE — Anesthesia Postprocedure Evaluation (Signed)
Anesthesia Post Note  Patient: Olivia Frost  Procedure(s) Performed: AN AD HOC LABOR EPIDURAL     Patient location during evaluation: Mother Baby Anesthesia Type: Epidural Level of consciousness: awake and alert Pain management: pain level controlled Vital Signs Assessment: post-procedure vital signs reviewed and stable Respiratory status: spontaneous breathing, nonlabored ventilation and respiratory function stable Cardiovascular status: stable Postop Assessment: no headache, no backache and epidural receding Anesthetic complications: no   No notable events documented.  Last Vitals:  Vitals:   11/25/22 1334 11/25/22 1440  BP: (!) 129/90 122/75  Pulse: 78 83  Resp: 20 20  Temp: 36.9 C 36.8 C  SpO2: 99%     Last Pain:  Vitals:   11/25/22 1534  TempSrc:   PainSc: 0-No pain   Pain Goal:                   Olivia Frost

## 2022-11-25 NOTE — Anesthesia Procedure Notes (Signed)
Epidural Patient location during procedure: OB Start time: 11/25/2022 8:09 AM End time: 11/25/2022 8:21 AM  Staffing Anesthesiologist: Val Eagle, MD Performed: anesthesiologist   Preanesthetic Checklist Completed: patient identified, IV checked, risks and benefits discussed, monitors and equipment checked, pre-op evaluation and timeout performed  Epidural Patient position: sitting Prep: DuraPrep Patient monitoring: heart rate, continuous pulse ox and blood pressure Approach: midline Location: L4-L5 Injection technique: LOR saline  Needle:  Needle type: Tuohy  Needle gauge: 17 G Needle length: 9 cm Needle insertion depth: 7 cm Catheter type: closed end flexible Catheter size: 19 Gauge Catheter at skin depth: 14 cm Test dose: negative and 2% lidocaine with Epi 1:200 K  Assessment Events: blood not aspirated, no cerebrospinal fluid, injection not painful, no injection resistance, no paresthesia and negative IV test

## 2022-11-26 ENCOUNTER — Inpatient Hospital Stay (HOSPITAL_COMMUNITY): Admission: AD | Admit: 2022-11-26 | Payer: 59 | Source: Home / Self Care

## 2022-11-26 ENCOUNTER — Encounter: Payer: 59 | Admitting: Certified Nurse Midwife

## 2022-11-26 ENCOUNTER — Other Ambulatory Visit: Payer: 59

## 2022-11-26 LAB — TYPE AND SCREEN: Antibody Screen: NEGATIVE

## 2022-11-26 MED ORDER — SENNOSIDES-DOCUSATE SODIUM 8.6-50 MG PO TABS: 2.0000 | ORAL_TABLET | Freq: Every day | ORAL | 0 refills | Status: DC | PRN

## 2022-11-26 MED ORDER — IBUPROFEN 600 MG PO TABS: 600.0000 mg | ORAL_TABLET | Freq: Three times a day (TID) | ORAL | 0 refills | Status: DC | PRN

## 2022-11-26 NOTE — Lactation Note (Signed)
This note was copied from a baby's chart. Lactation Consultation Note  Patient Name: Olivia Frost Date: 11/26/2022 Age:30 hours Reason for consult: Follow-up assessment  P2, Encouraged mother to offer both breasts per feeding session.  Reviewed engorgement care and monitoring voids/stools.  Maternal Data Has patient been taught Hand Expression?: Yes  Feeding Mother's Current Feeding Choice: Breast Milk  Interventions Interventions: Breast feeding basics reviewed;Education  Discharge Discharge Education: Engorgement and breast care;Warning signs for feeding baby Pump: Personal  Consult Status Consult Status: Complete Date: 11/26/22    Dahlia Byes [facility] 11/26/2022, 1:29 PM

## 2022-11-30 ENCOUNTER — Emergency Department
Admission: EM | Admit: 2022-11-30 | Discharge: 2022-11-30 | Disposition: A | Discharge status: Home / Self Care | Payer: 59 | Attending: Emergency Medicine | Admitting: Emergency Medicine

## 2022-11-30 ENCOUNTER — Emergency Department: Payer: 59

## 2022-11-30 ENCOUNTER — Telehealth: Payer: Self-pay

## 2022-11-30 ENCOUNTER — Encounter: Payer: Self-pay | Admitting: Intensive Care

## 2022-11-30 ENCOUNTER — Other Ambulatory Visit: Payer: Self-pay

## 2022-11-30 DIAGNOSIS — R319 Hematuria, unspecified: Secondary | ICD-10-CM | POA: Diagnosis not present

## 2022-11-30 DIAGNOSIS — N938 Other specified abnormal uterine and vaginal bleeding: Secondary | ICD-10-CM | POA: Diagnosis present

## 2022-11-30 DIAGNOSIS — N390 Urinary tract infection, site not specified: Secondary | ICD-10-CM

## 2022-11-30 DIAGNOSIS — R102 Pelvic and perineal pain: Secondary | ICD-10-CM

## 2022-11-30 LAB — COMPREHENSIVE METABOLIC PANEL
ALT: 21 U/L (ref 0–44)
AST: 19 U/L (ref 15–41)
Albumin: 3.2 g/dL — ABNORMAL LOW (ref 3.5–5.0)
Alkaline Phosphatase: 91 U/L (ref 38–126)
Anion gap: 6 (ref 5–15)
BUN: 15 mg/dL (ref 6–20)
CO2: 26 mmol/L (ref 22–32)
Calcium: 8.8 mg/dL — ABNORMAL LOW (ref 8.9–10.3)
Chloride: 109 mmol/L (ref 98–111)
Creatinine, Ser: 0.78 mg/dL (ref 0.44–1.00)
GFR, Estimated: >60 mL/min (ref >60)
Glucose, Bld: 84 mg/dL (ref 70–99)
Potassium: 4 mmol/L (ref 3.5–5.1)
Sodium: 141 mmol/L (ref 135–145)
Total Bilirubin: 0.5 mg/dL (ref 0.3–1.2)
Total Protein: 7.5 g/dL (ref 6.5–8.1)

## 2022-11-30 LAB — CBC WITH DIFFERENTIAL/PLATELET
Abs Immature Granulocytes: 0.04 10*3/uL (ref 0.00–0.07)
Basophils Absolute: 0 10*3/uL (ref 0.0–0.1)
Basophils Relative: 0 %
Eosinophils Absolute: 0.2 10*3/uL (ref 0.0–0.5)
Eosinophils Relative: 2 %
HCT: 33.6 % — ABNORMAL LOW (ref 36.0–46.0)
Hemoglobin: 10.6 g/dL — ABNORMAL LOW (ref 12.0–15.0)
Immature Granulocytes: 1 %
Lymphocytes Relative: 25 %
Lymphs Abs: 1.9 10*3/uL (ref 0.7–4.0)
MCH: 26.7 pg (ref 26.0–34.0)
MCHC: 31.5 g/dL (ref 30.0–36.0)
MCV: 84.6 fL (ref 80.0–100.0)
Monocytes Absolute: 0.6 10*3/uL (ref 0.1–1.0)
Monocytes Relative: 8 %
Neutro Abs: 4.6 10*3/uL (ref 1.7–7.7)
Neutrophils Relative %: 64 %
Platelets: 346 10*3/uL (ref 150–400)
RBC: 3.97 MIL/uL (ref 3.87–5.11)
RDW: 16 % — ABNORMAL HIGH (ref 11.5–15.5)
WBC: 7.3 10*3/uL (ref 4.0–10.5)
nRBC: 0 % (ref 0.0–0.2)

## 2022-11-30 LAB — URINALYSIS, ROUTINE W REFLEX MICROSCOPIC
Bilirubin Urine: NEGATIVE
Glucose, UA: NEGATIVE mg/dL
Ketones, ur: NEGATIVE mg/dL
Nitrite: NEGATIVE
Protein, ur: NEGATIVE mg/dL
Specific Gravity, Urine: 1.014 (ref 1.005–1.030)
pH: 6 (ref 5.0–8.0)

## 2022-11-30 LAB — WET PREP, GENITAL
Clue Cells Wet Prep HPF POC: NONE SEEN
Sperm: NONE SEEN
Trich, Wet Prep: NONE SEEN
WBC, Wet Prep HPF POC: >=10 — AB (ref <10)
Yeast Wet Prep HPF POC: NONE SEEN

## 2022-11-30 LAB — MAGNESIUM: Magnesium: 2 mg/dL (ref 1.7–2.4)

## 2022-11-30 LAB — LACTIC ACID, PLASMA: Lactic Acid, Venous: 0.6 mmol/L (ref 0.5–1.9)

## 2022-11-30 LAB — CHLAMYDIA/NGC RT PCR (ARMC ONLY)
Chlamydia Tr: NOT DETECTED
N gonorrhoeae: NOT DETECTED

## 2022-11-30 LAB — HCG, QUANTITATIVE, PREGNANCY: hCG, Beta Chain, Quant, S: 54 m[IU]/mL — ABNORMAL HIGH (ref <5)

## 2022-11-30 MED ORDER — CEFDINIR 300 MG PO CAPS
300.0000 mg | ORAL_CAPSULE | Freq: Once | ORAL | Status: DC
  Filled 2022-11-30: qty 1

## 2022-11-30 MED ORDER — CEFDINIR 300 MG PO CAPS
300.0000 mg | ORAL_CAPSULE | Freq: Once | ORAL | Status: AC
  Administered 2022-11-30: 300 mg via ORAL
  Filled 2022-11-30: qty 1

## 2022-11-30 MED ORDER — IBUPROFEN 600 MG PO TABS
600.0000 mg | ORAL_TABLET | Freq: Once | ORAL | Status: AC
  Administered 2022-11-30: 600 mg via ORAL
  Filled 2022-11-30: qty 1

## 2022-11-30 MED ORDER — LIDOCAINE HCL (PF) 1 % IJ SOLN
INTRAMUSCULAR | Status: AC
  Filled 2022-11-30: qty 5

## 2022-11-30 MED ORDER — KETOROLAC TROMETHAMINE 30 MG/ML IJ SOLN
30.0000 mg | Freq: Once | INTRAMUSCULAR | Status: DC
  Filled 2022-11-30: qty 1

## 2022-11-30 MED ORDER — ACETAMINOPHEN 500 MG PO TABS
1000.0000 mg | ORAL_TABLET | Freq: Once | ORAL | Status: AC
  Administered 2022-11-30: 1000 mg via ORAL
  Filled 2022-11-30: qty 2

## 2022-11-30 MED ORDER — CEFDINIR 300 MG PO CAPS: 300.0000 mg | ORAL_CAPSULE | Freq: Two times a day (BID) | ORAL | 0 refills | Status: AC

## 2022-11-30 MED ORDER — CEFTRIAXONE SODIUM 1 G IJ SOLR
1.0000 g | Freq: Once | INTRAMUSCULAR | Status: DC
  Filled 2022-11-30: qty 10

## 2022-11-30 NOTE — Discharge Instructions (Addendum)
You were seen in the emergency department for ongoing pelvic pain.  You had an ultrasound done that did not show an obvious sign of retained products of conception.  Your blood work was normal today.  You did have findings concerning for possible urinary tract infection.  You were given your first dose of antibiotics in the emergency department.  Start taking antibiotics as prescribed.  A urine was sent for culture.  If you have ongoing symptoms in 2 days after being on antibiotics since important that you follow-up closely with your obstetrician.  If you have worsening bleeding, worsening pain or fever you need to return to the emergency department for reevaluation.  Pain control:  Ibuprofen (motrin/aleve/advil) - You can take 3 tablets (600 mg) every 6 hours as needed for pain/fever.  Acetaminophen (tylenol) - You can take 2 extra strength tablets (1000 mg) every 6 hours as needed for pain/fever.  You can alternate these medications or take them together.  Make sure you eat food/drink water when taking these medications.

## 2022-11-30 NOTE — ED Provider Triage Note (Signed)
Emergency Medicine Provider Triage Evaluation Note  Olivia Frost , a 32 y.o. female  was evaluated in triage.  Pt complains of pain 5 days post partum.  Has seen blood and having pain across lower abdomen.    Review of Systems  Positive: Pain.   Negative: No fever, chills, V/D.  No urinary sx.  Physical Exam  LMP 02/19/2022 (Approximate)  Gen:   Awake, no distress   Resp:  Normal effort  MSK:   Moves extremities without difficulty  Other:    Medical Decision Making  Medically screening exam initiated at 3:51 PM.  Appropriate orders placed.  [name] was informed that the remainder of the evaluation will be completed by another provider, this initial triage assessment does not replace that evaluation, and the importance of remaining in the ED until their evaluation is complete.     Tommi Rumps, PA-C 11/30/22 1554

## 2022-11-30 NOTE — ED Notes (Signed)
Discussed patient and VS with J. Cuthriell, PA.  Mg level added to labs. Will continue to monitor patient.  Patient is AAOx3.  Skin warm and dry. NAD

## 2022-11-30 NOTE — ED Triage Notes (Signed)
Patient presents 5 days post partum with left and right sided pelvic pain. Reports passing large blood clot size of a golf ball yesterday. C/o headache. Denies b/p issues during pregnancy. Denies vision changes.   Reports vaginal delivery. Reported it took an extra 15 minutes of tugging and pulling to get her placenta out.

## 2022-11-30 NOTE — Telephone Encounter (Signed)
Patient calling in 4 days postpartum. She states having vaginal pain that is breaking through her currently ibuprofen. She denies headaches or heavy bleeding, states passing a large clot over the weekend. She voiced wanting to be seen today to be examined. Advised patient we are unable to see her today but she could go to L&D if necessary, states she will head that way within 20-30 minutes.

## 2022-11-30 NOTE — ED Provider Notes (Addendum)
[facility] Provider Note    Event Date/Time   First MD Initiated Contact with Patient 11/30/22 1621     (approximate)   History   Pelvic Pain   HPI  Olivia Frost is a 32 y.o. female with no significant past medical history presents to the emergency department with lower abdominal pain and vaginal bleeding.  Patient had spontaneous vaginal delivery 5 days ago and has had ongoing lower pelvic pain since that time.  States that she had significant amount of pelvic pain during her pregnancy as well.  3 days ago noted a large blood clot.  Called her gynecology team today who told her to come into the emergency department for evaluation.  No significant ongoing bleeding at this time.  Does endorse mild amount of dysuria but no urinary urgency or frequency.  No history of kidney stones.  No prior abdominal surgeries.  1 prior vaginal delivery.  Denies any shortness of breath or chest pain.     Physical Exam   Triage Vital Signs: ED Triage Vitals  Enc Vitals Group     BP 11/30/22 1552 (!) 132/106     Pulse Rate 11/30/22 1552 85     Resp 11/30/22 1552 18     Temp 11/30/22 1552 98.4 F (36.9 C)     Temp Source 11/30/22 1552 Oral     SpO2 11/30/22 1552 98 %     Weight 11/30/22 1553 257 lb 15 oz (117 kg)     Height 11/30/22 1553 5\' 8"  (1.727 m)     Head Circumference --      Peak Flow --      Pain Score 11/30/22 1552 7     Pain Loc --      Pain Edu? --      Excl. in GC? --     Most recent vital signs: Vitals:   11/30/22 1552 11/30/22 1630  BP: (!) 132/106 (!) 130/94  Pulse: 85 80  Resp: 18 18  Temp: 98.4 F (36.9 C)   SpO2: 98% 98%    Physical Exam Constitutional:      Appearance: She is well-developed.  HENT:     Head: Atraumatic.  Eyes:     Conjunctiva/sclera: Conjunctivae normal.  Cardiovascular:     Rate and Rhythm: Regular rhythm.  Pulmonary:     Effort: No respiratory distress.  Abdominal:     General: There is no distension.      Tenderness: There is abdominal tenderness (Bilateral lower abdominal tenderness to palpation with no rebound or guarding.).  Genitourinary:    Comments: Minimal amount of bleeding in the vaginal vault.  No cervical motion tenderness. Musculoskeletal:        General: Normal range of motion.     Cervical back: Normal range of motion.  Skin:    General: Skin is warm.     Capillary Refill: Capillary refill takes less than 2 seconds.  Neurological:     Mental Status: She is alert. Mental status is at baseline.      IMPRESSION / MDM / ASSESSMENT AND PLAN / ED COURSE  I reviewed the triage vital signs and the nursing notes.  Differential diagnosis including ovarian cyst, pelvic pain from recent delivery, urinary tract infection, retained products   RADIOLOGY I independently reviewed imaging, my interpretation of imaging: Transvaginal ultrasound -no obvious retained products.  Read as normal flow and no obvious retained products.   Labs (all labs ordered are listed, but only  abnormal results are displayed) Labs interpreted as -    Labs Reviewed  WET PREP, GENITAL - Abnormal; Notable for the following components:      Result Value   WBC, Wet Prep HPF POC >=10 (*)    All other components within normal limits  CBC WITH DIFFERENTIAL/PLATELET - Abnormal; Notable for the following components:   Hemoglobin 10.6 (*)    HCT 33.6 (*)    RDW 16.0 (*)    All other components within normal limits  COMPREHENSIVE METABOLIC PANEL - Abnormal; Notable for the following components:   Calcium 8.8 (*)    Albumin 3.2 (*)    All other components within normal limits  URINALYSIS, ROUTINE W REFLEX MICROSCOPIC - Abnormal; Notable for the following components:   Color, Urine YELLOW (*)    APPearance HAZY (*)    Hgb urine dipstick LARGE (*)    Leukocytes,Ua LARGE (*)    Bacteria, UA RARE (*)    All other components within normal limits  HCG, QUANTITATIVE, PREGNANCY - Abnormal; Notable for the  following components:   hCG, Beta Chain, Quant, S 54 (*)    All other components within normal limits  CHLAMYDIA/NGC RT PCR ([facility] ONLY)            URINE CULTURE  LACTIC ACID, PLASMA  MAGNESIUM  LACTIC ACID, PLASMA    UA with questionable findings of urinary tract infection.  Patient does have dysuria.  Will treat the patient for urinary tract infection.  Urine sent for culture.  Discussed with obstetrician on-call, Tonita Cong, recommended starting on antibiotics for possible urinary tract infection.  Have a low suspicion for retained products or endometria-itis.  Recommended follow-up closely with obstetrician in the next 2 days for ongoing symptoms, bleeding precautions and fever precautions to return to the emergency department.     PROCEDURES:  Critical Care performed: No  Procedures  Patient's presentation is most consistent with acute presentation with potential threat to life or bodily function.   MEDICATIONS ORDERED IN ED: Medications  ibuprofen (ADVIL) tablet 600 mg (has no administration in time range)  cefTRIAXone (ROCEPHIN) injection 1 g (has no administration in time range)  acetaminophen (TYLENOL) tablet 1,000 mg (1,000 mg Oral Given 11/30/22 1706)    FINAL CLINICAL IMPRESSION(S) / ED DIAGNOSES   Final diagnoses:  Pelvic pain in female  Urinary tract infection with hematuria, site unspecified     Rx / DC Orders   ED Discharge Orders          Ordered    cefdinir (OMNICEF) 300 MG capsule  2 times daily        11/30/22 1913             Note:  This document was prepared using Dragon voice recognition software and may include unintentional dictation errors.   Corena Herter, MD 11/30/22 Angelene Giovanni    Corena Herter, MD 11/30/22 Serena Croissant

## 2022-11-30 NOTE — ED Notes (Addendum)
L+D called and notified of patient arrival. Per patient, she spoke with French Ana at [facility] who told her to come to [facility] to be seen through L+D for c/o pelvic pain.  Patient is post partum, deliver 5/29.  Patient states there were complications when delivering the placenta.  G2P2.  L+D unaware of patient being sent for evaluation and spoke with Midwife who instructed that patient be seen through ED, OB is available for consult if needed.  Patient informed.  Patient is AAOx3.  Skin warm and dry. NAD

## 2022-12-01 LAB — URINE CULTURE: Culture: NO GROWTH

## 2022-12-02 ENCOUNTER — Telehealth: Payer: Self-pay

## 2022-12-02 ENCOUNTER — Other Ambulatory Visit: Payer: Self-pay

## 2022-12-02 ENCOUNTER — Emergency Department
Admission: EM | Admit: 2022-12-02 | Discharge: 2022-12-02 | Disposition: A | Discharge status: Home / Self Care | Payer: 59 | Attending: Emergency Medicine | Admitting: Emergency Medicine

## 2022-12-02 DIAGNOSIS — Z8616 Personal history of COVID-19: Secondary | ICD-10-CM | POA: Insufficient documentation

## 2022-12-02 DIAGNOSIS — N939 Abnormal uterine and vaginal bleeding, unspecified: Secondary | ICD-10-CM

## 2022-12-02 LAB — CBC
HCT: 35.9 % — ABNORMAL LOW (ref 36.0–46.0)
Hemoglobin: 11.3 g/dL — ABNORMAL LOW (ref 12.0–15.0)
MCH: 26.7 pg (ref 26.0–34.0)
MCHC: 31.5 g/dL (ref 30.0–36.0)
MCV: 84.9 fL (ref 80.0–100.0)
Platelets: 387 10*3/uL (ref 150–400)
RBC: 4.23 MIL/uL (ref 3.87–5.11)
RDW: 16.2 % — ABNORMAL HIGH (ref 11.5–15.5)
WBC: 6.6 10*3/uL (ref 4.0–10.5)
nRBC: 0 % (ref 0.0–0.2)

## 2022-12-02 LAB — HCG, QUANTITATIVE, PREGNANCY: hCG, Beta Chain, Quant, S: 20 m[IU]/mL — ABNORMAL HIGH (ref <5)

## 2022-12-02 LAB — BASIC METABOLIC PANEL
Anion gap: 9 (ref 5–15)
BUN: 12 mg/dL (ref 6–20)
CO2: 23 mmol/L (ref 22–32)
Calcium: 8.8 mg/dL — ABNORMAL LOW (ref 8.9–10.3)
Chloride: 109 mmol/L (ref 98–111)
Creatinine, Ser: 0.77 mg/dL (ref 0.44–1.00)
GFR, Estimated: >60 mL/min (ref >60)
Glucose, Bld: 89 mg/dL (ref 70–99)
Potassium: 3.9 mmol/L (ref 3.5–5.1)
Sodium: 141 mmol/L (ref 135–145)

## 2022-12-02 NOTE — ED Triage Notes (Signed)
Pt to ED for continued hematuria. Also states has elevated hcg that ob was concerned about.  Seen 2 days ago for same.

## 2022-12-02 NOTE — Telephone Encounter (Signed)
Pt calling; is 7d pp; feels she's having complication(s); went to ER for blood in urine; they said they would send results to [place]; is urinating more blood today.  [phone]  Pt aware urine culture is not back yet b/c it has to grow for 48hrs so it should be back sometime today.  Pt aware Beta HCG is at 54 and I'm not sure that is normal one week after delivery so adv pt to go back to the ER and bring this to their attention which they will probably see it anyway but to make sure they know.  Pt states baby is doing very well.

## 2022-12-02 NOTE — Discharge Instructions (Signed)
You will likely continue to have some vaginal bleeding.  If your bleeding is becoming heavier then call your OB/GYN.  Take 400 to 600 mg of ibuprofen every 6 hours for pain.  You can stop the cefdinir as your urine sample did not grow any bacteria.

## 2022-12-02 NOTE — ED Provider Notes (Signed)
[facility] Provider Note    Event Date/Time   First MD Initiated Contact with Patient 12/02/22 1556     (approximate)   History   Hematuria   HPI  Olivia Frost is a 32 y.o. female with recent spontaneous vaginal delivery who presents because of blood in her urine.  Patient had spontaneous vaginal delivery on 5/29.  Since then she has had lower abdominal/pelvic pain.  She has had some ongoing vaginal bleeding as well.  Was seen in the ED 2 days ago on 6/3.  Ultrasound at that time showed postpartum uterus with thickened and heterogeneous endometrium but no abnormal blood flow on Doppler.  OB/GYN was phone consulted and they recommended starting her on cefdinir for possible UTI.  They were less concerned about postpartum endometritis or retained products.  At that time patient's beta-hCG was 54.  She presents today because of more blood in her urine.  Says she was peeing today and she noticed more blood in the toilet.  She is having some ongoing vaginal bleeding which she says is slightly increased from 2 days ago.  She is changing her pad about every 2-3 hours.  Her lower abdominal pain is fairly similar to before.  Denies fevers or chills.  Denies burning with urination.  Denies purulent or foul-smelling vaginal discharge.     Past Medical History:  Diagnosis Date   COVID 03/26/2022    Patient Active Problem List   Diagnosis Date Noted   Anemia during pregnancy in third trimester 10/09/2022   SVD (spontaneous vaginal delivery) 10/07/2022   Obesity in pregnancy 09/03/2022   Supervision of other normal pregnancy, antepartum 04/22/2022     Physical Exam  Triage Vital Signs: ED Triage Vitals [12/02/22 1354]  Enc Vitals Group     BP (!) 130/92     Pulse Rate 74     Resp 18     Temp 97.9 F (36.6 C)     Temp src      SpO2 98 %     Weight 225 lb (102.1 kg)     Height 5\' 8"  (1.727 m)     Head Circumference      Peak Flow      Pain Score 3      Pain Loc      Pain Edu?      Excl. in GC?     Most recent vital signs: Vitals:   12/02/22 1354  BP: (!) 130/92  Pulse: 74  Resp: 18  Temp: 97.9 F (36.6 C)  SpO2: 98%     General: Awake, no distress.  CV:  Good peripheral perfusion.  Resp:  Normal effort.  Abd:  No distention. Soft nontender abdomen, no significant uterine tenderness  Neuro:             Awake, Alert, Oriented x 3  Other:     ED Results / Procedures / Treatments  Labs (all labs ordered are listed, but only abnormal results are displayed) Labs Reviewed  CBC - Abnormal; Notable for the following components:      Result Value   Hemoglobin 11.3 (*)    HCT 35.9 (*)    RDW 16.2 (*)    All other components within normal limits  BASIC METABOLIC PANEL - Abnormal; Notable for the following components:   Calcium 8.8 (*)    All other components within normal limits  HCG, QUANTITATIVE, PREGNANCY - Abnormal; Notable for the following components:   hCG,  Beta Chain, Quant, S 20 (*)    All other components within normal limits     EKG     RADIOLOGY    PROCEDURES:  Critical Care performed: No  Procedures    MEDICATIONS ORDERED IN ED: Medications - No data to display   IMPRESSION / MDM / ASSESSMENT AND PLAN / ED COURSE  I reviewed the triage vital signs and the nursing notes.                              Patient's presentation is most consistent with acute complicated illness / injury requiring diagnostic workup.  Differential diagnosis includes, but is not limited to, UTI, retained products, postpartum endometritis, normal post pregnancy  Patient is a 32 year old female who is about 7 days postpartum after SVD who presents today with vaginal bleeding and lower abdominal discomfort.  This been going on since she gave birth.  Her main concern today was actually what she thought was blood in her urine.  She was seen 2 days ago with reassuring ultrasound that did not show any Doppler flow to suggest  retained products.  Today when she urinated she noticed more blood in the toilet.  She shows me a photo of the toilet which has yellow urine and some blood layering at the bottom which I suspect is more likely to be vaginal given the urine does not self look discolored.  Her abdominal/pelvic pain is stable and unchanged today.  Has not had fevers.  She does have some ongoing vaginal bleeding but is only changing her pad every 3 hours.  [facility] told patient to come to ED for evaluation.  Patient's vital signs are stable.  She looks well on exam.  She has no uterine tenderness abdominal exam is benign hemoglobin is uptrending and 11.3.  Normal BMP.  beta-hCG is downtrending at 20 from 54, 2 days ago.  Patient was able to urinate from this and urine looks completely clear.  Overall I think that most of this is consistent with normal post pregnancy.  I did reach out to [facility] with [facility] and she agrees that this is to be expected.  We discussed return precautions as well as supportive measures ibuprofen heating pad etc.       FINAL CLINICAL IMPRESSION(S) / ED DIAGNOSES   Final diagnoses:  Postpartum hemorrhage, unspecified type     Rx / DC Orders   ED Discharge Orders     None        Note:  This document was prepared using Dragon voice recognition software and may include unintentional dictation errors.   Georga Hacking, MD 12/02/22 [phone]

## 2022-12-04 ENCOUNTER — Telehealth (HOSPITAL_COMMUNITY): Payer: Self-pay | Admitting: *Deleted

## 2022-12-04 NOTE — Telephone Encounter (Signed)
Mom reports feeling good. No concerns about herself at this time. EPDS completed recently in peds office per patient. Hospital score=0) Mom reports baby is doing well. Feeding, peeing, and pooping without difficulty. Safe sleep reviewed. Mom reports no concerns about baby at present.  Duffy Rhody, RN 12-04-2022 at 9:29am

## 2023-01-05 ENCOUNTER — Ambulatory Visit: Payer: 59 | Admitting: Obstetrics

## 2023-01-07 ENCOUNTER — Ambulatory Visit: Payer: 59 | Admitting: Obstetrics

## 2023-01-11 ENCOUNTER — Ambulatory Visit (INDEPENDENT_AMBULATORY_CARE_PROVIDER_SITE_OTHER): Payer: 59 | Admitting: Obstetrics

## 2023-01-11 ENCOUNTER — Encounter: Payer: Self-pay | Admitting: Obstetrics

## 2023-01-11 VITALS — BP 118/82 | HR 70 | Ht 68.0 in | Wt 227.0 lb

## 2023-01-11 DIAGNOSIS — Z3009 Encounter for other general counseling and advice on contraception: Secondary | ICD-10-CM

## 2023-01-11 DIAGNOSIS — Z30430 Encounter for insertion of intrauterine contraceptive device: Secondary | ICD-10-CM | POA: Diagnosis not present

## 2023-01-11 DIAGNOSIS — F32A Depression, unspecified: Secondary | ICD-10-CM

## 2023-01-11 DIAGNOSIS — M6208 Separation of muscle (nontraumatic), other site: Secondary | ICD-10-CM

## 2023-01-11 DIAGNOSIS — Z975 Presence of (intrauterine) contraceptive device: Secondary | ICD-10-CM | POA: Insufficient documentation

## 2023-01-11 DIAGNOSIS — Z3202 Encounter for pregnancy test, result negative: Secondary | ICD-10-CM | POA: Diagnosis not present

## 2023-01-11 LAB — POCT URINE PREGNANCY: Preg Test, Ur: NEGATIVE

## 2023-01-11 MED ORDER — PARAGARD INTRAUTERINE COPPER IU IUD
1.0000 | INTRAUTERINE_SYSTEM | Freq: Once | INTRAUTERINE | Status: AC
Start: 2023-01-11 — End: 2023-01-11
  Administered 2023-01-11: 1 via INTRAUTERINE

## 2023-01-11 NOTE — Progress Notes (Addendum)
Post Partum Visit Note  Olivia Frost is a 32 y.o. G51P2012 female who presents for a postpartum visit. She is 7 weeks postpartum following a normal spontaneous vaginal delivery.  I have fully reviewed the prenatal and intrapartum course. The delivery was at [redacted]w[redacted]d.  Anesthesia: epidural. Postpartum course has been uncomplicated. Olivia Frost is doing well. Olivia is feeding by breast. Bleeding has stopped. Bowel function is normal. Bladder function is normal. Patient is sexually active. Desires Paragard IUD today. Postpartum depression screening: negative.   The pregnancy intention screening data noted above was reviewed. Potential methods of contraception were discussed. The patient elected to proceed with No data recorded.   Edinburgh Postnatal Depression Scale - 01/11/23 1445       Edinburgh Postnatal Depression Scale:  In the Past 7 Days   I have been able to laugh and see the funny side of things. 0    I have looked forward with enjoyment to things. 1    I have blamed myself unnecessarily when things went wrong. 0    I have been anxious or worried for no good reason. 2    I have felt scared or panicky for no good reason. 0    Things have been getting on top of me. 1    I have been so unhappy that I have had difficulty sleeping. 0    I have felt sad or miserable. 1    I have been so unhappy that I have been crying. 0    The thought of harming myself has occurred to me. 0    Edinburgh Postnatal Depression Scale Total 5             Health Maintenance Due  Topic Date Due   COVID-19 Vaccine (4 - 2023-24 season) 02/27/2022    The following portions of the patient's history were reviewed and updated as appropriate: allergies, current medications, past medical history, past surgical history, and problem list.  Review of Systems Pertinent items are noted in HPI.  Objective:  BP 118/82   Pulse 70   Ht 5\' 8"  (1.727 m)   Wt 227 lb (103 kg)   LMP 12/28/2022 (Approximate)    Breastfeeding Yes   BMI 34.52 kg/m    General:  alert, cooperative, and appears stated age   Breasts:  normal  Lungs: clear to auscultation bilaterally  Heart:  regular rate and rhythm, S1, S2 normal, no murmur, click, rub or gallop  Abdomen: soft, non-tender; bowel sounds normal; no masses,  no organomegaly DR of 3 FB and umbilical hernia  Wound: N/A  GU exam:  normal       Procedure Note  Olivia Frost is a 32 y.o. G4W1027 who presents today for IUD insertion. She desires reversible long-term contraception. We have thoroughly reviewed the risks, benefits, and alternatives, and she has elected to proceed with Paragard insertion.    UPT: negative Pelvic exam: VULVA: normal appearing vulva with no masses, tenderness or lesions, VAGINA: normal appearing vagina with normal color and discharge, no lesions, CERVIX: normal appearing cervix without discharge or lesions, UTERUS: uterus is normal size, shape, consistency and nontender.   Procedure Note Consent was obtained prior to the procedure. A bimanual exam was performed to determine the position of the uterus. A sterile speculum was placed in the vagina, and the cervix was visualized. Betadine was applied to the cervix followed by 4% lidocaine. An Olivia Frost was placed on the anterior lip of the cervix, and  gentle traction was applied to straighten and stabilize it. The uterus was sounded to about 7.5 cm. The IUD was inserted to the appropriate depth and the insertion tool was removed. The strings were trimmed to about 3 cm. Bleeding was minimal. Olivia Frost tolerated the procedure well. Post-procedure care and warning signs were reviewed with Olivia Frost   Assessment:    1. Counseling for initiation of birth control method   2. Postpartum care and examination of lactating mother   3. Encounter for IUD insertion    Normal 7-week postpartum exam.   Plan:   Essential components of care per ACOG recommendations:  1.  Mood and well being:  Patient with negative depression screening today. Feeling some ups and downs. Reviewed local resources for support. Desires referral to counselor. - Patient tobacco use? No.   - hx of drug use? No.    2. Infant care and feeding:  -Patient currently breastmilk feeding?   -Social determinants of health (SDOH) reviewed in EPIC. No concerns  3. Sexuality, contraception and birth spacing - Patient does not want a pregnancy in the next year.  Desired family size is undecided.  - Reviewed reproductive life planning. Reviewed contraceptive methods based on pt preferences and effectiveness.  Patient desired IUD or IUS today.   - Discussed birth spacing of 18 months  4. Sleep and fatigue -Encouraged family/partner/community support of 4 hrs of uninterrupted sleep to help with mood and fatigue  5. Physical Recovery  - Discussed patients delivery and complications. She describes her labor as good. - Patient had a  NSVB . Patient had a labial laceration that was not repaired. Perineal healing reviewed. Patient expressed understanding - Patient has urinary incontinence? No. - Patient is safe to resume physical and sexual activity  6.  Health Maintenance - HM due items addressed Yes - Last pap smear  Diagnosis  Date Value Ref Range Status  02/13/2021   Final   - Negative for intraepithelial lesion or malignancy (NILM)   Pap smear not done at today's visit.  -Breast Cancer screening indicated? No.   7. Chronic Disease/Pregnancy Condition follow up: None  - PCP follow up  Glenetta Borg, CNM [facility]

## 2023-01-15 NOTE — Telephone Encounter (Signed)
She can return to work 02/11/23 unless the issue being treated with PT will interfere with her work functions and she needs to delay her return.

## 2023-01-15 NOTE — Telephone Encounter (Signed)
Hi Missy, pt called triage and said as of now she is scheduled to go back to work on 02/11/23. Given you have referred her to PT, will this date change?

## 2023-01-15 NOTE — Telephone Encounter (Signed)
Pt aware.

## 2023-01-19 ENCOUNTER — Ambulatory Visit: Payer: Medicaid Other | Attending: Obstetrics | Admitting: Physical Therapy

## 2023-01-19 ENCOUNTER — Encounter: Payer: Self-pay | Admitting: Physical Therapy

## 2023-01-19 DIAGNOSIS — M5459 Other low back pain: Secondary | ICD-10-CM | POA: Diagnosis present

## 2023-01-19 DIAGNOSIS — M533 Sacrococcygeal disorders, not elsewhere classified: Secondary | ICD-10-CM | POA: Diagnosis present

## 2023-01-19 DIAGNOSIS — M6208 Separation of muscle (nontraumatic), other site: Secondary | ICD-10-CM | POA: Insufficient documentation

## 2023-01-19 DIAGNOSIS — R2689 Other abnormalities of gait and mobility: Secondary | ICD-10-CM | POA: Insufficient documentation

## 2023-01-19 NOTE — Patient Instructions (Signed)
  ___  Lengthen Back rib by R  shoulder   ( winging)    Lie on L  side , pillow between knees and under head  Pull  R arm overhead over mattress, grab the edge of mattress,pull it upward, drawing elbow away from ears  Breathing 10 reps  Brushing arm with 3/4 turn onto pillow behind back  Lying on L  side ,Pillow/ Block between knees     dragging top forearm across ribs below breast rotating 3/4 turn,  rotating  _R_ only this week ,  relax onto the pillow behind the back  and then back to other palm , maintain top palm on body whole top and not lift shoulder  10 reps    __  Avoid straining pelvic floor, abdominal muscles , spine  Use log rolling technique instead of getting out of bed with your neck or the sit-up     Log rolling into and out of bed   Log rolling into and out of bed If getting out of bed on R side, Bent knees, scoot hips/ shoulder to L  Raise R arm completely overhead, rolling onto armpit  Then lower bent knees to bed to get into complete side lying position  Then drop legs off bed, and push up onto R elbow/forearm, and use L hand to push onto the bed  __   Proper body mechanics with getting out of a chair to decrease strain  on back &pelvic floor   Avoid holding your breath when Getting out of the chair:  Scoot to front part of chair chair Heels behind knees, feet are hip width apart, nose over toes  Inhale like you are smelling roses Exhale to stand   __  Sitting with feet on ground, four points of contact Catch yourself crossing ankles and thighs  __

## 2023-01-19 NOTE — Therapy (Addendum)
OUTPATIENT PHYSICAL THERAPY EVALUATION   Patient Name: Olivia Frost MRN: [id number] DOB:[birth year], 32 y.o., female Today's Date: 01/19/2023   PT End of Session - 01/19/23 1642     Visit Number 1    Number of Visits 10    Date for PT Re-Evaluation 03/30/23    PT Start Time 1640    PT Stop Time 1720    PT Time Calculation (min) 40 min    Activity Tolerance Patient tolerated treatment well    Behavior During Therapy [facility] for tasks assessed/performed             Past Medical History:  Diagnosis Date   Anemia during pregnancy in third trimester 10/09/2022   COVID 03/26/2022   Obesity in pregnancy 09/03/2022   Supervision of other normal pregnancy, antepartum 04/22/2022              Clinical Staff    Provider      Office Location     [facility]    Dating     11/21/2022, by Patient Reported         Language     English    Anatomy US     Normal       Flu Vaccine     offer    Genetic Screen     NIPS: Negative      TDaP vaccine      09/03/22    Hgb A1C or   GTT    Early :  Third trimester : 23      Covid    Has booster         LAB RESULTS       Rhogam     B/Positiv   SVD (spontaneous vaginal delivery) 10/07/2022   Past Surgical History:  Procedure Laterality Date   WRIST SURGERY Left    Patient Active Problem List   Diagnosis Date Noted   Intrauterine device 01/11/2023    PCP:  pt is looking for a PCP   REFERRING PROVIDER: Chryl Heck, CNM   REFERRING DIAG: diastasis recti  Rationale for Evaluation and Treatment Rehabilitation  THERAPY DIAG:  Sacrococcygeal disorders, not elsewhere classified  Other abnormalities of gait and mobility  Other low back pain  ONSET DATE:   SUBJECTIVE:                                                                                                                                                                                           SUBJECTIVE STATEMENT: 1) Pelvic pain at the groin area B:  with getting out of bed   2)  R  CLBP  with the radiating pain to the back of the knee  Pt incurred scoliosis with the first child and tried PT but it did not help. From that she has experienced pinched nerves and knots . Pt has had shots for LBP.   Pain at its worst 7/10, at its best 5/10. Pain with sitting with soft / hard surface, getting out of bed, bending , picking infant, picking watermelon from floor,   PERTINENT HISTORY:  Athlete as a child and young adult, active at gym prior to first pregnancy 8 years ago ( included core workout, cardio, moderate weight lifting). Four years ago, pt started boxing with a trainer 2 x week.  Prior this pregnancy, pt did not exercise in the gym.  Currently, pt is walking with stroller 1 mile without stretches.   GYN Hx: both pregnancies without episiotomy / perineal scars.  Pt incurred scoliosis with the first child and tried PT but it did not help. From that she has experienced pinched nerves and knots . Pt has had shots for LBP.    PAIN: see above   PRECAUTIONS: None  WEIGHT BEARING RESTRICTIONS: No  FALLS:  Has patient fallen in last 6 months? 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 , sedentary desk job   PLOF:  no pain   PATIENT GOALS:   Strengthen core , less pain with sitting with soft / hard surface, getting out of bed, exercising   OBJECTIVE:    [facility] PT Assessment - 01/19/23 1644       AROM   Overall AROM Comments R sideflexion caused radiating R hip pain to the back of thigh ( Post Tx: no pain) ,      Strength   Overall Strength Comments R hip abd 4/5, L 5/5 ( post Tx: R hip abd 5/5)      Palpation   Spinal mobility L shoulder lowered,  R iliac crest lowered ( post Tx: levelled pelvis/ shoulder)      Bed Mobility   Bed Mobility --   poor mechanics on high bed, scooting and to sitting      Ambulation/Gait   Gait Comments 1.14 m/s (excessive R pelvic sway, limited posterior rotaion fo L trunk)             Pelvic Floor Special Questions - 01/19/23 1725     Diastasis Recti 3 fingers width above umbilicus with head lift , no finger width below             [facility] Adult PT Treatment/Exercise - 01/19/23 1644       Therapeutic Activites    Therapeutic Activities Other Therapeutic Activities    Other Therapeutic Activities explained anatomy  / physiology of deep core , goal setting, POC,      Neuro Re-ed    Neuro Re-ed Details  cued for body emchanics, one -sided stretches to realign spine ( see pt instructions)      Manual Therapy   Manual therapy comments STM/MWM at thoracic spine to realign spine               HOME EXERCISE PROGRAM: See pt instruction section    ASSESSMENT:  CLINICAL IMPRESSION:   Pt is a  32  yo  is post partum 8 weeks and presents with post-partum pelvic pain and radiating CLP ,  and diastasis recti  which impact QOL, ADL, fitness, and community activities.   Pt's musculoskeletal assessment revealed uneven pelvic girdle and shoulder height, asymmetries to gait pattern, limited spinal /pelvic mobility, dyscoordination and strength of pelvic floor mm,  diastasis recti,  hip weakness, poor body mechanics which places strain on the abdominal/pelvic floor mm. These are deficits that indicate an ineffective intraabdominal pressure system associated with increased risk for pt's Sx.                  Pt will benefit from proper coordination training and education on fitness and functional positions in order to yield greater outcomes as pt performed sit-ups/ crunches in the past.  Advised pt to not perform sit-ups and crunches as these movement patterns lead to more downward forces on the pelvic floor, negatively impacting abdominopelvic/spinal dysfunctions.   Pt was provided education on etiology of Sx with anatomy, physiology explanation with  images along with the benefits of customized pelvic PT Tx based on pt's medical conditions and musculoskeletal deficits.  Explained the physiology of deep core mm coordination and roles of pelvic floor function in urination, defecation, sexual function, and postural control with deep core mm system.   Following Tx today which pt tolerated without complaints,  pt demo'd proper body mechanics to minimize straining pelvic floor.  Manual Tx also helped to realignment of spine/ pelvis after which pt reported no radiating pain with R sideflexion.  Plan to address pelvic floor issues once pelvis and spine are realigned to yield better outcomes.   Pt benefits from skilled PT.    OBJECTIVE IMPAIRMENTS decreased activity tolerance, decreased coordination, decreased endurance, decreased mobility, difficulty walking, decreased ROM, decreased strength, decreased safety awareness, hypomobility, increased muscle spasms, impaired flexibility, improper body mechanics, postural dysfunction, and pain.    ACTIVITY LIMITATIONS  self-care, sleep, home chores, work tasks    PARTICIPATION LIMITATIONS:  community, gym activities    PERSONAL FACTORS        are also affecting patient's functional outcome.    REHAB POTENTIAL: Good   CLINICAL DECISION MAKING: Evolving/moderate complexity   EVALUATION COMPLEXITY: Moderate    PATIENT EDUCATION:    Education details: Showed pt anatomy images. Explained muscles attachments/ connection, physiology of deep core system/ spinal- thoracic-pelvis-lower kinetic chain as they relate to pt's presentation, Sx, and past Hx. Explained what and how these areas of deficits need to be restored to balance and function    See Therapeutic activity / neuromuscular re-education section  Answered pt's questions.   Person educated: Patient Education method: Explanation, Demonstration, Tactile cues, Verbal cues, and Handouts Education comprehension: verbalized understanding, returned  demonstration, verbal cues required, tactile cues required, and needs further education     PLAN: PT FREQUENCY: 1x/week   PT DURATION: 10 weeks   PLANNED INTERVENTIONS: Therapeutic exercises, Therapeutic activity, Neuromuscular re-education, Balance training, Gait training, Patient/Family education, Self Care, Joint mobilization, Spinal mobilization, Moist heat, Taping, and Manual therapy, dry needling.   PLAN FOR NEXT SESSION: See clinical impression for plan     GOALS: Goals reviewed with patient? Yes  SHORT TERM GOALS: Target date: 02/16/2023    Pt will demo IND with HEP                    Baseline: Not IND            Goal status: INITIAL   LONG TERM GOALS: Target date: 03/30/2023    1.Pt will demo proper deep core coordination without chest  breathing and optimal excursion of diaphragm/pelvic floor in order to promote spinal stability and pelvic floor function  Baseline: dyscoordination Goal status: INITIAL  2.  Pt will demo > 5 pt change on FOTO  to improve QOL and function Pelvic Pain baseline - 54 Lower score = better function    Lumber baseline  -  45  Higher score =  better function   Goal status: INITIAL  3.  Pt will demo proper body mechanics in against gravity tasks and ADLs  work tasks, fitness  to minimize straining pelvic floor / back                  Baseline: not IND, improper form that places strain on pelvic floor                Goal status: INITIAL    4. Pt will demo levelled pelvic girdle and spine across 2 weeks in order to experience no pain with sitting with soft / hard surface, getting out of bed Baseline: L shoulder , R iliac crest lowered  Pain with sitting with soft / hard surface, getting out of bed, Goal status: INITIAL    5. Pt will demo decreased separation of diastasis to < 2 fingers width in order to promote stronger intraabdominal pressure system for spinal / postural stability and pelvic floor function  Baseline: 3 fingers  width above umbilicus with head lift ,  Goal status: INITIAL   6. Pt will demo increased gait speed > 1.3 m/s with reciprocal gait pattern in order to ambulate safely in community and return to fitness routine   Baseline:1.14 m/s (excessive R pelvic sway, limited posterior rotaion fo L trunk)  Goal status: INITIAL     Mariane Masters, PT 01/19/2023, 4:48 PM

## 2023-01-27 ENCOUNTER — Ambulatory Visit: Payer: Medicaid Other | Admitting: Physical Therapy

## 2023-01-27 DIAGNOSIS — R2689 Other abnormalities of gait and mobility: Secondary | ICD-10-CM

## 2023-01-27 DIAGNOSIS — M533 Sacrococcygeal disorders, not elsewhere classified: Secondary | ICD-10-CM

## 2023-01-27 DIAGNOSIS — M5459 Other low back pain: Secondary | ICD-10-CM

## 2023-01-27 NOTE — Therapy (Signed)
OUTPATIENT PHYSICAL THERAPY TREATMENT   Patient Name: BAELI BRINKMEIER MRN: [id number] DOB:Apr 12, 1991, 32 y.o., female Today's Date: 01/27/2023   PT End of Session - 01/27/23 1554     Visit Number 2    Number of Visits 10    Date for PT Re-Evaluation 03/30/23    PT Start Time 1548    PT Stop Time 1630    PT Time Calculation (min) 42 min    Activity Tolerance Patient tolerated treatment well    Behavior During Therapy [facility] for tasks assessed/performed             Past Medical History:  Diagnosis Date   Anemia during pregnancy in third trimester 10/09/2022   COVID 03/26/2022   Obesity in pregnancy 09/03/2022   Supervision of other normal pregnancy, antepartum 04/22/2022              Clinical Staff    Provider      Office Location     [facility]    Dating     11/21/2022, by Patient Reported         Language     English    Anatomy US     Normal       Flu Vaccine     offer    Genetic Screen     NIPS: Negative      TDaP vaccine      09/03/22    Hgb A1C or   GTT    Early :  Third trimester : 24      Covid    Has booster         LAB RESULTS       Rhogam     B/Positiv   SVD (spontaneous vaginal delivery) 10/07/2022   Past Surgical History:  Procedure Laterality Date   WRIST SURGERY Left    Patient Active Problem List   Diagnosis Date Noted   Intrauterine device 01/11/2023    PCP:  pt is looking for a PCP   REFERRING PROVIDER: Chryl Heck, CNM   REFERRING DIAG: diastasis recti  Rationale for Evaluation and Treatment Rehabilitation  THERAPY DIAG:  Sacrococcygeal disorders, not elsewhere classified  Other abnormalities of gait and mobility  Other low back pain  ONSET DATE:   SUBJECTIVE:       SUBJECTIVE STATEMENT TODAY:  Pt reported 20% improved LBP and there is no more radiating pain down the back of thigh all week   The pelvic pain still hurts with getting in and out car and shower.  SUBJECTIVE STATEMENT ON EVAL 01/19/23  : 1) Pelvic pain at the groin area B:  with getting out of bed   2)  R  CLBP with the radiating pain to the back of the knee  Pt incurred scoliosis with the first child and tried PT but it did not help. From that she has experienced pinched nerves and knots . Pt has had shots for LBP.   Pain at its worst 7/10, at its best 5/10. Pain with sitting with soft / hard surface, getting out of bed, bending , picking infant, picking watermelon from floor,   PERTINENT HISTORY:  Athlete as a child and young adult, active at gym prior to first pregnancy 8 years ago ( included core workout, cardio, moderate weight lifting). Four years ago, pt started boxing with a trainer 2 x week.  Prior this pregnancy, pt did not exercise in the gym.  Currently, pt is walking with stroller 1 mile without stretches.   GYN Hx: both pregnancies without episiotomy / perineal scars.  Pt incurred scoliosis with the first child and tried PT but it did not help. From that she has experienced pinched nerves and knots . Pt has had shots for LBP.    PAIN: see above   PRECAUTIONS: None  WEIGHT BEARING RESTRICTIONS: No  FALLS:  Has patient fallen in last 6 months? 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 , sedentary desk job   PLOF:  no pain   PATIENT GOALS:   Strengthen core , less pain with sitting with soft / hard surface, getting out of bed, exercising   OBJECTIVE:       [facility] PT Assessment - 01/27/23 1631       AROM   Overall AROM Comments no pain with  R sideflexion, restored AROM with sideflexion B      Palpation   Spinal mobility tightness along R paraspinals/ intercostal, medial scapula, hypombile  T5, 12 R    Palpation comment tenderness along pubic symphysis B             [facility] Adult PT Treatment/Exercise - 01/27/23 1638       Therapeutic Activites    Other Therapeutic Activities cued for tub t/f, car t/f  , donning  on / off SIJ belt      Neuro Re-ed    Neuro Re-ed Details  cued for deep core level 1-2      Manual Therapy   Manual therapy comments STM/MWM at thoracic spine to realign spine             HOME EXERCISE PROGRAM: See pt instruction section    ASSESSMENT:  CLINICAL IMPRESSION:                LBP pain improved with no more radiating pain.                Pelvis alignment is levelled by spinal deviations required manual Tx.  Following Tx today which pt tolerated without complaints,  pt demo'd proper body mechanics with tub/ car t/f to minimize pubic symphysis pain.  Provided SIJ belt for pelvic stability.  Initiated deep core training with cues for correct technique.  Adductor squeezes added as well. Plan to address pelvic floor issues once pelvis and spine are realigned to yield better outcomes.   Pt benefits  from skilled PT.    OBJECTIVE IMPAIRMENTS decreased activity tolerance, decreased coordination, decreased endurance, decreased mobility, difficulty walking, decreased ROM, decreased strength, decreased safety awareness, hypomobility, increased muscle spasms, impaired flexibility, improper body mechanics, postural dysfunction, and pain.    ACTIVITY LIMITATIONS  self-care, sleep, home chores, work tasks    PARTICIPATION LIMITATIONS:  community, gym activities    PERSONAL FACTORS        are also affecting patient's functional outcome.    REHAB POTENTIAL: Good   CLINICAL DECISION MAKING: Evolving/moderate complexity   EVALUATION COMPLEXITY: Moderate    PATIENT EDUCATION:    Education details: Showed pt anatomy images. Explained muscles attachments/ connection, physiology of deep core system/ spinal- thoracic-pelvis-lower kinetic chain as  they relate to pt's presentation, Sx, and past Hx. Explained what and how these areas of deficits need to be restored to balance and function    See Therapeutic activity / neuromuscular re-education section  Answered pt's questions.   Person educated: Patient Education method: Explanation, Demonstration, Tactile cues, Verbal cues, and Handouts Education comprehension: verbalized understanding, returned demonstration, verbal cues required, tactile cues required, and needs further education     PLAN: PT FREQUENCY: 1x/week   PT DURATION: 10 weeks   PLANNED INTERVENTIONS: Therapeutic exercises, Therapeutic activity, Neuromuscular re-education, Balance training, Gait training, Patient/Family education, Self Care, Joint mobilization, Spinal mobilization, Moist heat, Taping, and Manual therapy, dry needling.   PLAN FOR NEXT SESSION: See clinical impression for plan     GOALS: Goals reviewed with patient? Yes  SHORT TERM GOALS: Target date: 02/16/2023    Pt will demo IND with HEP                    Baseline: Not IND            Goal status: INITIAL   LONG TERM GOALS: Target date: 03/30/2023    1.Pt will demo proper deep core coordination without chest breathing and optimal excursion of diaphragm/pelvic floor in order to promote spinal stability and pelvic floor function  Baseline: dyscoordination Goal status: INITIAL  2.  Pt will demo > 5 pt change on FOTO  to improve QOL and function Pelvic Pain baseline - 54 Lower score = better function    Lumber baseline  -  45  Higher score =  better function   Goal status: INITIAL  3.  Pt will demo proper body mechanics in against gravity tasks and ADLs  work tasks, fitness  to minimize straining pelvic floor / back                  Baseline: not IND, improper form that places strain on pelvic floor                Goal status: INITIAL    4. Pt will demo levelled pelvic girdle and spine across 2 weeks in order to experience no  pain with sitting with soft / hard surface, getting out of bed Baseline: L shoulder , R iliac crest lowered  Pain with sitting with soft / hard surface, getting out of bed, Goal status: INITIAL    5. Pt will demo decreased separation of diastasis to < 2 fingers width in order to promote stronger intraabdominal pressure system for spinal / postural stability and pelvic floor function  Baseline: 3 fingers width above umbilicus with head lift ,  Goal status: INITIAL   6. Pt will demo increased gait speed > 1.3 m/s with reciprocal gait pattern in order  to ambulate safely in community and return to fitness routine   Baseline:1.14 m/s (excessive R pelvic sway, limited posterior rotaion fo L trunk)  Goal status: INITIAL     Mariane Masters, PT 01/27/2023, 3:55 PM

## 2023-01-27 NOTE — Patient Instructions (Signed)
Deep core level 1-2 ( handout)  2 x day  __   Adductor squeeze 3 sec, 10 reps x 3     Folded pillow   __   Transfer in and out of tub and car with incremental movement of knees and use hands for extra stability

## 2023-02-05 ENCOUNTER — Ambulatory Visit: Payer: Medicaid Other | Attending: Obstetrics | Admitting: Physical Therapy

## 2023-02-05 DIAGNOSIS — R2689 Other abnormalities of gait and mobility: Secondary | ICD-10-CM | POA: Insufficient documentation

## 2023-02-05 DIAGNOSIS — M533 Sacrococcygeal disorders, not elsewhere classified: Secondary | ICD-10-CM | POA: Insufficient documentation

## 2023-02-05 DIAGNOSIS — M5459 Other low back pain: Secondary | ICD-10-CM | POA: Insufficient documentation

## 2023-02-05 NOTE — Patient Instructions (Addendum)
Multifidis twist   Band is on doorknob: sit facing perpendicular to door , sit halfway towards front of chair, firm through 4 points of contact at buttocks and feet. Feet are placed hip with apart.   Twisting trunk without moving the hips and knees Hold band at the level of ribcage, elbows bent,shoulder blades roll back and down like squeezing a pencil under armpit   Exhale twist,.10-15 deg away from door without moving your hips/ knees, press more weight on the side of the sitting bones/ foot opp of your direction of turn as your counterweight. Continue to maintain equal weight through legs.  Keep knee unlocked.  30 reps   __  Zig zag stretch   Reclined twist for hips and side of the hips/ legs  Lay on your back, knees bend Scoot hips to the R , leave shoulders in place Wobble knees to the L side 45 deg and to midline  10 reps   __   kitchen counter stretches  Hands on kitchen counter,   Palms shoulder width apart  Minisquat postion Trunk is parallel to floor  A) Pull buttocks back to lengthen spine, knees bent  3 breaths   B) Bring R hand to the L, and stretch the R side trunk  3 breaths   Brings hands to center again Do the same to the L side stretch by placing L hand on top of R   D) Modified thread the needle R hand on L thigh, L  thigh pushing out slightly as the R hands pull in,  elbow bent and pulls to theR,  Look under L armpit   Do the same to other side  ____

## 2023-02-05 NOTE — Therapy (Signed)
OUTPATIENT PHYSICAL THERAPY TREATMENT   Patient Name: AUTUM STETLER MRN: [id number] DOB:May 20, 1991, 32 y.o., female Today's Date: 02/05/2023   PT End of Session - 02/05/23 0828     Visit Number 3    Number of Visits 10    Date for PT Re-Evaluation 03/30/23    PT Start Time 0810    PT Stop Time 0850    PT Time Calculation (min) 40 min    Activity Tolerance Patient tolerated treatment well    Behavior During Therapy [facility] for tasks assessed/performed             Past Medical History:  Diagnosis Date   Anemia during pregnancy in third trimester 10/09/2022   COVID 03/26/2022   Obesity in pregnancy 09/03/2022   Supervision of other normal pregnancy, antepartum 04/22/2022              Clinical Staff    Provider      Office Location      Ob/Gyn    Dating     11/21/2022, by Patient Reported         Language     English    Anatomy US     Normal       Flu Vaccine     offer    Genetic Screen     NIPS: Negative      TDaP vaccine      09/03/22    Hgb A1C or   GTT    Early :  Third trimester : 44      Covid    Has booster         LAB RESULTS       Rhogam     B/Positiv   SVD (spontaneous vaginal delivery) 10/07/2022   Past Surgical History:  Procedure Laterality Date   WRIST SURGERY Left    Patient Active Problem List   Diagnosis Date Noted   Intrauterine device 01/11/2023    PCP:  pt is looking for a PCP   REFERRING PROVIDER: Chryl Heck, CNM   REFERRING DIAG: diastasis recti  Rationale for Evaluation and Treatment Rehabilitation  THERAPY DIAG:  Sacrococcygeal disorders, not elsewhere classified  Other low back pain  Other abnormalities of gait and mobility  ONSET DATE:   SUBJECTIVE:       SUBJECTIVE STATEMENT TODAY:  Pt reported back and pelvic pain returned but not worse after a trip to [facility] with lots of walking and stairs.                                                                                                                                                             SUBJECTIVE STATEMENT ON EVAL 01/19/23  : 1) Pelvic pain at the groin area B:  with getting out of bed  2)  R  CLBP with the radiating pain to the back of the knee  Pt incurred scoliosis with the first child and tried PT but it did not help. From that she has experienced pinched nerves and knots . Pt has had shots for LBP.   Pain at its worst 7/10, at its best 5/10. Pain with sitting with soft / hard surface, getting out of bed, bending , picking infant, picking watermelon from floor,   PERTINENT HISTORY:  Athlete as a child and young adult, active at gym prior to first pregnancy 8 years ago ( included core workout, cardio, moderate weight lifting). Four years ago, pt started boxing with a trainer 2 x week.  Prior this pregnancy, pt did not exercise in the gym.  Currently, pt is walking with stroller 1 mile without stretches.   GYN Hx: both pregnancies without episiotomy / perineal scars.  Pt incurred scoliosis with the first child and tried PT but it did not help. From that she has experienced pinched nerves and knots . Pt has had shots for LBP.    PAIN: see above   PRECAUTIONS: None  WEIGHT BEARING RESTRICTIONS: No  FALLS:  Has patient fallen in last 6 months? 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 , sedentary desk job   PLOF:  no pain   PATIENT GOALS:   Strengthen core , less pain with sitting with soft / hard surface, getting out of bed, exercising   OBJECTIVE:      [facility] PT Assessment - 02/05/23 0829       Observation/Other Assessments   Observations adducted knee, IR slightly, poor diassociation between trunk , BLE      Palpation   Spinal mobility L shoulder lowered,  R iliac crest lowered    Palpation comment tightness along medial scapula R, deviated T7-10 R             [facility]  Adult PT Treatment/Exercise - 02/05/23 0831       Therapeutic Activites    Other Therapeutic Activities explained rearanging stroller and furniture to maintain proper alignment and body mechanics to minimize twisting back      Neuro Re-ed    Neuro Re-ed Details  cued for new stretches , multidifis strengthening      Manual Therapy   Manual therapy comments STM/MWM at thoracic spine to realign spine             HOME EXERCISE PROGRAM: See pt instruction section    ASSESSMENT:  CLINICAL IMPRESSION:                Continued to apply manual Tx to realign spine and pelvis. Added multifidis strengthening with cues for proper technique. Explained rearanging stroller and furniture to maintain proper alignment and body mechanics to minimize twisting back. Plan to address pelvic floor issues once pelvis and spine are realigned to yield better outcomes.   Pt benefits from skilled PT.    OBJECTIVE IMPAIRMENTS decreased activity tolerance, decreased coordination, decreased endurance, decreased mobility, difficulty walking, decreased ROM, decreased strength, decreased safety awareness, hypomobility, increased muscle spasms, impaired flexibility, improper body mechanics, postural dysfunction, and pain.    ACTIVITY LIMITATIONS  self-care, sleep, home chores, work tasks    PARTICIPATION LIMITATIONS:  community, gym activities    PERSONAL FACTORS  are also affecting patient's functional outcome.    REHAB POTENTIAL: Good   CLINICAL DECISION MAKING: Evolving/moderate complexity   EVALUATION COMPLEXITY: Moderate    PATIENT EDUCATION:    Education details: Showed pt anatomy images. Explained muscles attachments/ connection, physiology of deep core system/ spinal- thoracic-pelvis-lower kinetic chain as they relate to pt's presentation, Sx, and past Hx. Explained what and how these areas of deficits need to be restored to balance and function    See Therapeutic activity / neuromuscular  re-education section  Answered pt's questions.   Person educated: Patient Education method: Explanation, Demonstration, Tactile cues, Verbal cues, and Handouts Education comprehension: verbalized understanding, returned demonstration, verbal cues required, tactile cues required, and needs further education     PLAN: PT FREQUENCY: 1x/week   PT DURATION: 10 weeks   PLANNED INTERVENTIONS: Therapeutic exercises, Therapeutic activity, Neuromuscular re-education, Balance training, Gait training, Patient/Family education, Self Care, Joint mobilization, Spinal mobilization, Moist heat, Taping, and Manual therapy, dry needling.   PLAN FOR NEXT SESSION: See clinical impression for plan     GOALS: Goals reviewed with patient? Yes  SHORT TERM GOALS: Target date: 02/16/2023    Pt will demo IND with HEP                    Baseline: Not IND            Goal status: INITIAL   LONG TERM GOALS: Target date: 03/30/2023    1.Pt will demo proper deep core coordination without chest breathing and optimal excursion of diaphragm/pelvic floor in order to promote spinal stability and pelvic floor function  Baseline: dyscoordination Goal status: INITIAL  2.  Pt will demo > 5 pt change on FOTO  to improve QOL and function Pelvic Pain baseline - 54 Lower score = better function    Lumber baseline  -  45  Higher score =  better function   Goal status: INITIAL  3.  Pt will demo proper body mechanics in against gravity tasks and ADLs  work tasks, fitness  to minimize straining pelvic floor / back                  Baseline: not IND, improper form that places strain on pelvic floor                Goal status: INITIAL    4. Pt will demo levelled pelvic girdle and spine across 2 weeks in order to experience no pain with sitting with soft / hard surface, getting out of bed Baseline: L shoulder , R iliac crest lowered  Pain with sitting with soft / hard surface, getting out of bed, Goal status:  INITIAL    5. Pt will demo decreased separation of diastasis to < 2 fingers width in order to promote stronger intraabdominal pressure system for spinal / postural stability and pelvic floor function  Baseline: 3 fingers width above umbilicus with head lift ,  Goal status: INITIAL   6. Pt will demo increased gait speed > 1.3 m/s with reciprocal gait pattern in order to ambulate safely in community and return to fitness routine   Baseline:1.14 m/s (excessive R pelvic sway, limited posterior rotaion fo L trunk)  Goal status: INITIAL     Mariane Masters, PT 02/05/2023, 8:49 AM

## 2023-02-08 ENCOUNTER — Encounter: Payer: Self-pay | Admitting: Obstetrics

## 2023-02-08 ENCOUNTER — Telehealth: Payer: Self-pay

## 2023-02-08 NOTE — Telephone Encounter (Signed)
Pt calling; needs letter or some form of documentation sent to her team at [employer]; she is supposed to start back to work on the 15th; she is physically not able to - not strong enough yet to return to work per her and her PT.  [phone]

## 2023-02-10 ENCOUNTER — Telehealth: Payer: Self-pay

## 2023-02-10 NOTE — Telephone Encounter (Signed)
We are typically able to keep a patient out for max 12 weeks if needed.

## 2023-02-10 NOTE — Telephone Encounter (Signed)
Hi Olivia Frost, is stating she's not able to return to work yet. Spoke with Dr. Hildred Laser MD here in our office. She states we can only do 12 week max unless if you approve her being out of work longer. She states she's suppose to return tomorrow 02/11/2023. What are your thoughts?

## 2023-02-10 NOTE — Telephone Encounter (Signed)
Hey Dr. Valentino Saxon,  I'm messaging you since you are back up, and Missy is out of the office. This patient delivered in May, patient is in PT Missy referred. Patient is suppose to return to work tomorrow. Per patient she's not able to she's still having PT once a week, has already had 3 visits still has 7 visits left. Are we able to keep this patient out of work? If so for how long?

## 2023-02-10 NOTE — Telephone Encounter (Signed)
If her PT provider recommends that she remain out of work due to physical limitations then we can extend beyond that.

## 2023-02-11 ENCOUNTER — Ambulatory Visit: Payer: Medicaid Other | Admitting: Physical Therapy

## 2023-02-18 ENCOUNTER — Ambulatory Visit: Payer: Medicaid Other | Admitting: Physical Therapy

## 2023-02-18 DIAGNOSIS — M533 Sacrococcygeal disorders, not elsewhere classified: Secondary | ICD-10-CM

## 2023-02-18 DIAGNOSIS — R2689 Other abnormalities of gait and mobility: Secondary | ICD-10-CM

## 2023-02-18 DIAGNOSIS — M5459 Other low back pain: Secondary | ICD-10-CM

## 2023-02-18 NOTE — Therapy (Addendum)
OUTPATIENT PHYSICAL THERAPY TREATMENT   Patient Name: JOSCELYNE KASIK MRN: [id number] DOB:10/30/1990, 32 y.o., female Today's Date: 02/18/2023   PT End of Session - 02/18/23 1644     Visit Number 4    Number of Visits 10    Date for PT Re-Evaluation 03/30/23    PT Start Time 1635    PT Stop Time 1715    PT Time Calculation (min) 40 min    Activity Tolerance Patient tolerated treatment well    Behavior During Therapy [facility] for tasks assessed/performed             Past Medical History:  Diagnosis Date   Anemia during pregnancy in third trimester 10/09/2022   COVID 03/26/2022   Obesity in pregnancy 09/03/2022   Supervision of other normal pregnancy, antepartum 04/22/2022              Clinical Staff    Provider      Office Location     [facility]    Dating     11/21/2022, by Patient Reported         Language     English    Anatomy US     Normal       Flu Vaccine     offer    Genetic Screen     NIPS: Negative      TDaP vaccine      09/03/22    Hgb A1C or   GTT    Early :  Third trimester : 15      Covid    Has booster         LAB RESULTS       Rhogam     B/Positiv   SVD (spontaneous vaginal delivery) 10/07/2022   Past Surgical History:  Procedure Laterality Date   WRIST SURGERY Left    Patient Active Problem List   Diagnosis Date Noted   Intrauterine device 01/11/2023    PCP:  pt is looking for a PCP   REFERRING PROVIDER: Chryl Heck, CNM   REFERRING DIAG: diastasis recti  Rationale for Evaluation and Treatment Rehabilitation  THERAPY DIAG:  Sacrococcygeal disorders, not elsewhere classified  Other low back pain  Other abnormalities of gait and mobility  ONSET DATE:   SUBJECTIVE:       SUBJECTIVE STATEMENT TODAY:  Pt reported she is doing her exercises.                                                                                                                                                   SUBJECTIVE STATEMENT ON EVAL 01/19/23  : 1) Pelvic pain at the  groin area B:  with getting out of bed   2)  R  CLBP with the radiating pain to the back of the knee  Pt incurred scoliosis with the first  child and tried PT but it did not help. From that she has experienced pinched nerves and knots . Pt has had shots for LBP.   Pain at its worst 7/10, at its best 5/10. Pain with sitting with soft / hard surface, getting out of bed, bending , picking infant, picking watermelon from floor,   PERTINENT HISTORY:  Athlete as a child and young adult, active at gym prior to first pregnancy 8 years ago ( included core workout, cardio, moderate weight lifting). Four years ago, pt started boxing with a trainer 2 x week.  Prior this pregnancy, pt did not exercise in the gym.  Currently, pt is walking with stroller 1 mile without stretches.   GYN Hx: both pregnancies without episiotomy / perineal scars.  Pt incurred scoliosis with the first child and tried PT but it did not help. From that she has experienced pinched nerves and knots . Pt has had shots for LBP.    PAIN: see above   PRECAUTIONS: None  WEIGHT BEARING RESTRICTIONS: No  FALLS:  Has patient fallen in last 6 months? 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 , sedentary desk job   PLOF:  no pain   PATIENT GOALS:   Strengthen core , less pain with sitting with soft / hard surface, getting out of bed, exercising   OBJECTIVE:      [facility] PT Assessment - 02/18/23 1650       Observation/Other Assessments   Observations increased lordosis in dolphin minisquats, IR hip, knee adducted, hyperextended knees      Palpation   Palpation comment tightness at low back, upper trap             [facility] Adult PT Treatment/Exercise - 02/05/23 0831       Therapeutic Activites    Other Therapeutic Activities explained rearanging stroller and  furniture to maintain proper alignment and body mechanics to minimize twisting back      Neuro Re-ed    Neuro Re-ed Details  cued for new stretches , multidifis strengthening      Manual Therapy   Manual therapy comments STM/MWM at thoracic spine to realign spine             HOME EXERCISE PROGRAM: See pt instruction section    ASSESSMENT:  CLINICAL IMPRESSION:                Pt required cues today for stretches for neck, midback, lumbar, LKC to perform at work as pt returns to work next week. Pt still showed increased lumbar lordosis and upper trap overuse and hyperextended knees, with hip IR./ knee adduction.  Discussed ergonomic set up with recommendations to minimize injuries. Pt reports she continues to do the other HEP.  Pt benefits from skilled PT.    OBJECTIVE IMPAIRMENTS decreased activity tolerance, decreased coordination, decreased endurance, decreased mobility, difficulty walking, decreased ROM, decreased strength, decreased safety awareness, hypomobility, increased muscle spasms, impaired flexibility, improper body mechanics, postural dysfunction, and pain.    ACTIVITY LIMITATIONS  self-care, sleep, home chores, work tasks    PARTICIPATION LIMITATIONS:  community, gym activities    PERSONAL FACTORS        are also affecting patient's functional outcome.    REHAB POTENTIAL: Good   CLINICAL DECISION MAKING: Evolving/moderate complexity   EVALUATION COMPLEXITY: Moderate  PATIENT EDUCATION:    Education details: Showed pt anatomy images. Explained muscles attachments/ connection, physiology of deep core system/ spinal- thoracic-pelvis-lower kinetic chain as they relate to pt's presentation, Sx, and past Hx. Explained what and how these areas of deficits need to be restored to balance and function    See Therapeutic activity / neuromuscular re-education section  Answered pt's questions.   Person educated: Patient Education method: Explanation, Demonstration,  Tactile cues, Verbal cues, and Handouts Education comprehension: verbalized understanding, returned demonstration, verbal cues required, tactile cues required, and needs further education     PLAN: PT FREQUENCY: 1x/week   PT DURATION: 10 weeks   PLANNED INTERVENTIONS: Therapeutic exercises, Therapeutic activity, Neuromuscular re-education, Balance training, Gait training, Patient/Family education, Self Care, Joint mobilization, Spinal mobilization, Moist heat, Taping, and Manual therapy, dry needling.   PLAN FOR NEXT SESSION: See clinical impression for plan     GOALS: Goals reviewed with patient? Yes  SHORT TERM GOALS: Target date: 02/16/2023    Pt will demo IND with HEP                    Baseline: Not IND            Goal status: INITIAL   LONG TERM GOALS: Target date: 03/30/2023    1.Pt will demo proper deep core coordination without chest breathing and optimal excursion of diaphragm/pelvic floor in order to promote spinal stability and pelvic floor function  Baseline: dyscoordination Goal status: INITIAL  2.  Pt will demo > 5 pt change on FOTO  to improve QOL and function Pelvic Pain baseline - 54 Lower score = better function    Lumber baseline  -  45  Higher score =  better function   Goal status: INITIAL  3.  Pt will demo proper body mechanics in against gravity tasks and ADLs  work tasks, fitness  to minimize straining pelvic floor / back                  Baseline: not IND, improper form that places strain on pelvic floor                Goal status: INITIAL    4. Pt will demo levelled pelvic girdle and spine across 2 weeks in order to experience no pain with sitting with soft / hard surface, getting out of bed Baseline: L shoulder , R iliac crest lowered  Pain with sitting with soft / hard surface, getting out of bed, Goal status: INITIAL    5. Pt will demo decreased separation of diastasis to < 2 fingers width in order to promote stronger  intraabdominal pressure system for spinal / postural stability and pelvic floor function  Baseline: 3 fingers width above umbilicus with head lift ,  Goal status: INITIAL   6. Pt will demo increased gait speed > 1.3 m/s with reciprocal gait pattern in order to ambulate safely in community and return to fitness routine   Baseline:1.14 m/s (excessive R pelvic sway, limited posterior rotaion fo L trunk)  Goal status: INITIAL     Mariane Masters, PT 02/18/2023, 5:30 PM

## 2023-02-18 NOTE — Patient Instructions (Addendum)
Stretches :  _wall stretch Clock stretch with head turns :  stand perpendicular to the wall, L side to the wall Tilt head to wall  Place L palm at 7 o clock Chin tuck like you are looking into armpit Look at "[place] on giant map  Swivel head with chin tucked to look in upper corner of ceiling as if you are look at  [place] on giant map "   5 reps   Switch direction, R palm on wall at 5 o 'clock   Chin tuck like you are looking into armpit Look at "[place]  on giant map  Swivel head with chin tucked to look in upper corner of ceiling as if you are look at  [facility] on giant map "   5 reps   __   kitchen counter stretches  Hands on kitchen counter,   Palms shoulder width apart  Minisquat postion Trunk is parallel to floor  A) Pull buttocks back to lengthen spine, knees bent  3 breaths   B) Bring R hand to the L, and stretch the R side trunk  3 breaths   Brings hands to center again Do the same to the L side stretch by placing L hand on top of R   D) Modified thread the needle R hand on L thigh, L  thigh pushing out slightly as the R hands pull in,  elbow bent and pulls to theR,  Look under L armpit   Do the same to other side  ____  Dolphin squats on wall   Fingers interlaced, elbows shoulder width apart, forearms in a triangle pressing against the wall Mini squat position   Inhale, exhale chin tucked, shoulders lengthen down from ears, as you rise up not locking knees, hairline brushes by thumbs ( "like dolphin snout / unicorn diving up our the water"  15 reps.  Make sure to not let the front ribs flare out, ribs over the pelvis aligned to not have a swayed back  Pressure through ballmounds to rise up, and dont lock the knees   __  Multifidis twist   Band is on doorknob: sit facing perpendicular to door , sit halfway towards front of chair, firm through 4 points of contact at buttocks and feet. Feet are placed hip with apart.   Twisting trunk without  moving the hips and knees Hold band at the level of ribcage, elbows bent,shoulder blades roll back and down like squeezing a pencil under armpit   Exhale twist,.10-15 deg away from door without moving your hips/ knees, press more weight on the side of the sitting bones/ foot opp of your direction of turn as your counterweight. Continue to maintain equal weight through legs.  Keep knee unlocked.  30 reps    __   Stretches on chair  - R back foot and toes straight,  Hip flexor  - twist to to L thigh   - - back foot and toes straight, knee bend  Hamstring, make sure to slightyl turn knee out   _  See ergonomic station handout

## 2023-02-25 ENCOUNTER — Ambulatory Visit: Payer: Medicaid Other | Admitting: Physical Therapy

## 2023-03-03 ENCOUNTER — Ambulatory Visit: Payer: Medicaid Other | Attending: Obstetrics | Admitting: Physical Therapy

## 2023-03-03 DIAGNOSIS — M533 Sacrococcygeal disorders, not elsewhere classified: Secondary | ICD-10-CM

## 2023-03-03 DIAGNOSIS — R2689 Other abnormalities of gait and mobility: Secondary | ICD-10-CM

## 2023-03-03 DIAGNOSIS — M5459 Other low back pain: Secondary | ICD-10-CM | POA: Diagnosis present

## 2023-03-04 NOTE — Patient Instructions (Addendum)
Place band in "U"    band under ballmounds  while laying on back w/ knees bent   Lying on back, knees bent    band under ballmounds  while laying on back w/ knees bent     Oblique/ scapula stabilization   Opposite arm   Place band in "U"    band under ballmounds  while laying on back w/ knees bent     30 reps  on each side  Holding band from opposite thigh,  Inhale,    exhale then pull band across body while keeping elbow , shoulders, back of the head pressed down    ______________  Strengthening feet arches:    Heel raises - heels together, minisquat  Minisquat motion, trunk bent , gaze onto floor like you are looking at your reflection over a lake/pond,  Knees bent pointed out like a "v" , navel ( center of mass) more forward  Heels together as you lift, pointed out like a "v"  KNEES ARE ALIGNED BEHIND THE TOES TO MINIMIZE STRAIN ON THE KNEES your  navel ( center of mass) more forward to a avoid dropping down fast and rocking more weight back onto heels , keep heels pressing against each other the whole time   20 reps  _________

## 2023-03-04 NOTE — Therapy (Signed)
OUTPATIENT PHYSICAL THERAPY TREATMENT   Patient Name: Olivia Frost MRN: [id number] DOB:[birth year], 32 y.o., female Today's Date: 03/04/2023   PT End of Session - 03/03/23 0852     Visit Number 5    Number of Visits 10    Date for PT Re-Evaluation 03/30/23    PT Start Time 0849    PT Stop Time 0930    PT Time Calculation (min) 41 min    Activity Tolerance Patient tolerated treatment well    Behavior During Therapy [facility] for tasks assessed/performed             Past Medical History:  Diagnosis Date   Anemia during pregnancy in third trimester 10/09/2022   COVID 03/26/2022   Obesity in pregnancy 09/03/2022   Supervision of other normal pregnancy, antepartum 04/22/2022              Clinical Staff    Provider      Office Location     [facility]    Dating     11/21/2022, by Patient Reported         Language     English    Anatomy US     Normal       Flu Vaccine     offer    Genetic Screen     NIPS: Negative      TDaP vaccine      09/03/22    Hgb A1C or   GTT    Early :  Third trimester : 36      Covid    Has booster         LAB RESULTS       Rhogam     B/Positiv   SVD (spontaneous vaginal delivery) 10/07/2022   Past Surgical History:  Procedure Laterality Date   WRIST SURGERY Left    Patient Active Problem List   Diagnosis Date Noted   Intrauterine device 01/11/2023    PCP:  pt is looking for a PCP   REFERRING PROVIDER: Chryl Heck, CNM   REFERRING DIAG: diastasis recti  Rationale for Evaluation and Treatment Rehabilitation  THERAPY DIAG:  Sacrococcygeal disorders, not elsewhere classified  Other low back pain  Other abnormalities of gait and mobility  ONSET DATE:   SUBJECTIVE:       SUBJECTIVE STATEMENT TODAY:  Pt reported her back has improved but her pubic pain is still painful. Her diastasis recti has been tender to the touch. Pt is applying for short term disability.                                                                                                                                                     SUBJECTIVE STATEMENT ON EVAL 01/19/23  : 1) Pelvic pain at the groin area B:  with getting out of bed  2)  R  CLBP with the radiating pain to the back of the knee  Pt incurred scoliosis with the first child and tried PT but it did not help. From that she has experienced pinched nerves and knots . Pt has had shots for LBP.   Pain at its worst 7/10, at its best 5/10. Pain with sitting with soft / hard surface, getting out of bed, bending , picking infant, picking watermelon from floor,   PERTINENT HISTORY:  Athlete as a child and young adult, active at gym prior to first pregnancy 8 years ago ( included core workout, cardio, moderate weight lifting). Four years ago, pt started boxing with a trainer 2 x week.  Prior this pregnancy, pt did not exercise in the gym.  Currently, pt is walking with stroller 1 mile without stretches.   GYN Hx: both pregnancies without episiotomy / perineal scars.  Pt incurred scoliosis with the first child and tried PT but it did not help. From that she has experienced pinched nerves and knots . Pt has had shots for LBP.    PAIN: see above   PRECAUTIONS: None  WEIGHT BEARING RESTRICTIONS: No  FALLS:  Has patient fallen in last 6 months? 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 , sedentary desk job   PLOF:  no pain   PATIENT GOALS:   Strengthen core , less pain with sitting with soft / hard surface, getting out of bed, exercising   OBJECTIVE:     Pelvic Floor Special Questions -      Diastasis Recti 3 fingers width above umbilicus with head lift , no finger width below             [facility] PT Assessment -       Palpation   Palpation comment limited anterolateral excursion of ribs , tightness at R SIJ, LKC tightness from R  SIJ to foot             [facility] Adult PT Treatment/Exercise - 03/04/23 2703       Neuro Re-ed    Neuro Re-ed Details  cued for oblique , TrA strenghtening with red band to address DRA, standing LKC ER of LKC      Manual Therapy   Manual therapy comments Fascial release over flank to promote lateral and anterior excursion of ribs for optimal diaphragm function and closure of DRA, improve pelvic pain ,  STM/MWM at R SIJ to [facility] to foot               HOME EXERCISE PROGRAM: See pt instruction section    ASSESSMENT:  CLINICAL IMPRESSION:  Pt remains compliant with back strenghtening HEP and reports back pain has improved.  Pt required manual Tx to address diastasis recti.   Pt still showed increased lumbar lordosis , requiring cues in new HEP .  Provided new HEP which addressed hip IR./ knee adduction/ tight feet instrinsic mm which will help with R SIJ / pubic pain. Pt demo'd correctly technique for new HEP post training and cues.   Pt benefits from skilled PT.    OBJECTIVE IMPAIRMENTS decreased activity tolerance, decreased coordination, decreased endurance, decreased mobility, difficulty walking, decreased ROM, decreased strength, decreased safety awareness, hypomobility, increased muscle spasms, impaired flexibility, improper body mechanics, postural dysfunction, and pain.    ACTIVITY LIMITATIONS  self-care, sleep, home chores, work tasks    PARTICIPATION LIMITATIONS:  community, gym activities    PERSONAL FACTORS        are also affecting patient's functional outcome.    REHAB POTENTIAL: Good   CLINICAL DECISION MAKING: Evolving/moderate complexity   EVALUATION COMPLEXITY: Moderate    PATIENT EDUCATION:    Education details: Showed pt anatomy images. Explained muscles attachments/ connection, physiology of deep core system/ spinal- thoracic-pelvis-lower kinetic chain as they relate to pt's presentation, Sx, and past Hx. Explained what and how these areas of  deficits need to be restored to balance and function    See Therapeutic activity / neuromuscular re-education section  Answered pt's questions.   Person educated: Patient Education method: Explanation, Demonstration, Tactile cues, Verbal cues, and Handouts Education comprehension: verbalized understanding, returned demonstration, verbal cues required, tactile cues required, and needs further education     PLAN: PT FREQUENCY: 1x/week   PT DURATION: 10 weeks   PLANNED INTERVENTIONS: Therapeutic exercises, Therapeutic activity, Neuromuscular re-education, Balance training, Gait training, Patient/Family education, Self Care, Joint mobilization, Spinal mobilization, Moist heat, Taping, and Manual therapy, dry needling.   PLAN FOR NEXT SESSION: See clinical impression for plan     GOALS: Goals reviewed with patient? Yes  SHORT TERM GOALS: Target date: 02/16/2023    Pt will demo IND with HEP                    Baseline: Not IND            Goal status: INITIAL   LONG TERM GOALS: Target date: 03/30/2023    1.Pt will demo proper deep core coordination without chest breathing and optimal excursion of diaphragm/pelvic floor in order to promote spinal stability and pelvic floor function  Baseline: dyscoordination Goal status: INITIAL  2.  Pt will demo > 5 pt change on FOTO  to improve QOL and function Pelvic Pain baseline - 54 Lower score = better function    Lumber baseline  -  45  Higher score =  better function   Goal status: INITIAL  3.  Pt will demo proper body mechanics in against gravity tasks and ADLs  work tasks, fitness  to minimize straining pelvic floor / back                  Baseline: not IND, improper form that places strain on pelvic floor                Goal status: INITIAL    4. Pt will demo levelled pelvic girdle and spine across 2 weeks in order to experience no pain with sitting with soft / hard surface, getting out of bed Baseline: L shoulder , R  iliac crest lowered  Pain with sitting with soft / hard surface, getting out of bed, Goal status: INITIAL    5. Pt will demo decreased separation of diastasis to < 2 fingers width in order to promote stronger intraabdominal pressure system for spinal / postural stability and pelvic floor function  Baseline: 3 fingers width above umbilicus with head lift ,  Goal status: INITIAL   6. Pt will demo increased gait speed > 1.3 m/s with reciprocal gait pattern in order to ambulate safely in community and return to fitness routine   Baseline:1.14 m/s (excessive R pelvic sway, limited posterior rotaion fo L trunk)  Goal status: INITIAL     Mariane Masters, PT 03/04/2023, 9:00 AM

## 2023-03-11 ENCOUNTER — Telehealth (INDEPENDENT_AMBULATORY_CARE_PROVIDER_SITE_OTHER): Payer: Medicaid Other | Admitting: Licensed Clinical Social Worker

## 2023-03-11 ENCOUNTER — Ambulatory Visit (HOSPITAL_COMMUNITY): Payer: Medicaid Other | Admitting: Licensed Clinical Social Worker

## 2023-03-11 NOTE — Telephone Encounter (Signed)
Olivia Frost had a virtual clinical assessment scheduled today for 3pm.  Clinician outreached her by phone at 3:08pm when she did not present on time despite email and text reminders that were sent.  Olivia Frost did not answer call, and clinician was unable to leave a message due to her phoneline appearing busy or disconnected.  Clinician informed front desk staff of no-show event at 3:15pm when Olivia Frost did not present or outreach office in time.    Noralee Stain, LCSW, LCAS 03/11/23

## 2023-03-18 ENCOUNTER — Ambulatory Visit: Payer: Medicaid Other | Admitting: Physical Therapy

## 2023-03-22 ENCOUNTER — Other Ambulatory Visit: Payer: Self-pay

## 2023-03-22 ENCOUNTER — Emergency Department
Admission: EM | Admit: 2023-03-22 | Discharge: 2023-03-22 | Discharge status: Against Medical Advice | Payer: Medicaid Other | Attending: Emergency Medicine | Admitting: Emergency Medicine

## 2023-03-22 DIAGNOSIS — Z5321 Procedure and treatment not carried out due to patient leaving prior to being seen by health care provider: Secondary | ICD-10-CM | POA: Insufficient documentation

## 2023-03-22 DIAGNOSIS — R109 Unspecified abdominal pain: Secondary | ICD-10-CM | POA: Insufficient documentation

## 2023-03-22 DIAGNOSIS — R197 Diarrhea, unspecified: Secondary | ICD-10-CM | POA: Insufficient documentation

## 2023-03-22 LAB — CBC
HCT: 35 % — ABNORMAL LOW (ref 36.0–46.0)
Hemoglobin: 11.3 g/dL — ABNORMAL LOW (ref 12.0–15.0)
MCH: 28.6 pg (ref 26.0–34.0)
MCHC: 32.3 g/dL (ref 30.0–36.0)
MCV: 88.6 fL (ref 80.0–100.0)
Platelets: 347 10*3/uL (ref 150–400)
RBC: 3.95 MIL/uL (ref 3.87–5.11)
RDW: 14.2 % (ref 11.5–15.5)
WBC: 7.4 10*3/uL (ref 4.0–10.5)
nRBC: 0 % (ref 0.0–0.2)

## 2023-03-22 LAB — COMPREHENSIVE METABOLIC PANEL
ALT: 13 U/L (ref 0–44)
AST: 15 U/L (ref 15–41)
Albumin: 4.3 g/dL (ref 3.5–5.0)
Alkaline Phosphatase: 101 U/L (ref 38–126)
Anion gap: 11 (ref 5–15)
BUN: 15 mg/dL (ref 6–20)
CO2: 22 mmol/L (ref 22–32)
Calcium: 9.3 mg/dL (ref 8.9–10.3)
Chloride: 105 mmol/L (ref 98–111)
Creatinine, Ser: 0.87 mg/dL (ref 0.44–1.00)
GFR, Estimated: >60 mL/min (ref >60)
Glucose, Bld: 92 mg/dL (ref 70–99)
Potassium: 3.5 mmol/L (ref 3.5–5.1)
Sodium: 138 mmol/L (ref 135–145)
Total Bilirubin: 0.6 mg/dL (ref 0.3–1.2)
Total Protein: 8.3 g/dL — ABNORMAL HIGH (ref 6.5–8.1)

## 2023-03-22 LAB — LIPASE, BLOOD: Lipase: 28 U/L (ref 11–51)

## 2023-03-22 LAB — URINALYSIS, ROUTINE W REFLEX MICROSCOPIC
Bacteria, UA: NONE SEEN
Bilirubin Urine: NEGATIVE
Glucose, UA: NEGATIVE mg/dL
Ketones, ur: NEGATIVE mg/dL
Leukocytes,Ua: NEGATIVE
Nitrite: NEGATIVE
Protein, ur: NEGATIVE mg/dL
Specific Gravity, Urine: 1.02 (ref 1.005–1.030)
pH: 6 (ref 5.0–8.0)

## 2023-03-22 NOTE — ED Triage Notes (Signed)
Pt prresents to ER with c/o mid-left side abd pain that started this morning, and has become progessively worse.  Pt states pain moves from around her umbilicus to her left side of abdomen.  Pt reports she has had some diarrhea today.  States she has not had to strain any.  Denies any urinary issues or vomiting.    Pt is otherwise A&O x4 and in NAD.

## 2023-03-30 DIAGNOSIS — S82853A Displaced trimalleolar fracture of unspecified lower leg, initial encounter for closed fracture: Secondary | ICD-10-CM

## 2023-03-30 HISTORY — DX: Displaced trimalleolar fracture of unspecified lower leg, initial encounter for closed fracture: S82.853A

## 2023-04-12 ENCOUNTER — Ambulatory Visit (INDEPENDENT_AMBULATORY_CARE_PROVIDER_SITE_OTHER): Payer: Medicaid Other | Admitting: Obstetrics & Gynecology

## 2023-04-12 ENCOUNTER — Encounter: Payer: Self-pay | Admitting: Obstetrics & Gynecology

## 2023-04-12 VITALS — BP 115/82 | HR 68 | Ht 68.0 in | Wt 228.0 lb

## 2023-04-12 DIAGNOSIS — R102 Pelvic and perineal pain: Secondary | ICD-10-CM | POA: Diagnosis not present

## 2023-04-12 MED ORDER — NORETHIN ACE-ETH ESTRAD-FE 1.5-30 MG-MCG PO TABS: 1.0000 | ORAL_TABLET | Freq: Every day | ORAL | 4 refills | Status: DC

## 2023-04-12 NOTE — Progress Notes (Signed)
    GYNECOLOGY PROGRESS NOTE  Subjective:    Patient ID: Olivia Frost, female    DOB: 05-Feb-1991, 32 y.o.   MRN: [id number]  HPI  Patient is a 32 y.o. [id number] here for pelvic pain, dyspareunia for 8 months. It got particularly bad on 03/23/2023. She ended up at the ED in [facility] and had a CT which was normal but a large amount of stool. She was prescribed Miralax. She has tried tylenol but not motrin.   The pain is worse since she got a Mirena in the postpartum period. She has monthly periods with her Mirena. She has used pelvic PT in the past and plans to return to this.  The following portions of the patient's history were reviewed and updated as appropriate: allergies, current medications, past family history, past medical history, past social history, past surgical history, and problem list.  Review of Systems Pertinent items are noted in HPI.  Pap normal 2022  Objective:   Blood pressure 115/82, pulse 68, height 5\' 8"  (1.727 m), weight 228 lb (103.4 kg), last menstrual period 03/21/2023, currently breastfeeding. Body mass index is 34.67 kg/m. Well nourished, well hydrated Black female, no apparent distress She is ambulating and conversing normally.   Assessment:   Abdominopelvic pain- trial of OCPs (with IUD in place) Restart pelvic PT Start taking IBU 400 mg BID RTC Pelvic ultrasound  Plan:  As above She will come back for follow up after ultrasound results are available.

## 2023-04-20 ENCOUNTER — Ambulatory Visit: Payer: Medicaid Other | Attending: Obstetrics | Admitting: Physical Therapy

## 2023-04-20 ENCOUNTER — Ambulatory Visit: Payer: Medicaid Other

## 2023-04-20 DIAGNOSIS — R2689 Other abnormalities of gait and mobility: Secondary | ICD-10-CM | POA: Insufficient documentation

## 2023-04-20 DIAGNOSIS — M533 Sacrococcygeal disorders, not elsewhere classified: Secondary | ICD-10-CM | POA: Diagnosis present

## 2023-04-20 DIAGNOSIS — M5459 Other low back pain: Secondary | ICD-10-CM | POA: Diagnosis present

## 2023-04-20 NOTE — Patient Instructions (Addendum)
Clam Shell 45 Degrees  Lying with hips and knees bent 45, one pillow between knees and ankles. Heel together, toes apart like ballerina,  Lift knee with exhale while pressing heels together. Be sure pelvis does not roll backward. Do not arch back. Do until burning occurs as tour baseline #  And work towards  20 times, each leg, 2 times per day.     Complementary   Sit at 45 deg turn with R leg and knee on edge of chair/ bench, L buttock hanging off the edge to bring the L foot back like a lunge, toes bent, lower heel to feel quad stretch,  pay attention to keeping pinky and first toe ballmound planted to align toes forward so ankles are not twerked   Repeat with other side   ___ ___

## 2023-04-20 NOTE — Therapy (Unsigned)
OUTPATIENT PHYSICAL THERAPY TREATMENT  / Recert   Patient Name: LAQUETTA DELISE MRN: [id number] DOB:24-Jan-1991, 32 y.o., female Today's Date: 04/20/2023   PT End of Session - 04/20/23 0812     Visit Number 6    Number of Visits 16    Date for PT Re-Evaluation 06/29/23    PT Start Time 0808    PT Stop Time 0848    PT Time Calculation (min) 40 min    Activity Tolerance Patient tolerated treatment well    Behavior During Therapy [facility] for tasks assessed/performed             Past Medical History:  Diagnosis Date   Anemia during pregnancy in third trimester 10/09/2022   COVID 03/26/2022   Obesity in pregnancy 09/03/2022   Supervision of other normal pregnancy, antepartum 04/22/2022              Clinical Staff    Provider      Office Location     [facility]    Dating     11/21/2022, by Patient Reported         Language     English    Anatomy US     Normal       Flu Vaccine     offer    Genetic Screen     NIPS: Negative      TDaP vaccine      09/03/22    Hgb A1C or   GTT    Early :  Third trimester : 4      Covid    Has booster         LAB RESULTS       Rhogam     B/Positiv   SVD (spontaneous vaginal delivery) 10/07/2022   Past Surgical History:  Procedure Laterality Date   WRIST SURGERY Left    Patient Active Problem List   Diagnosis Date Noted   Intrauterine device 01/11/2023    PCP:  pt is looking for a PCP   REFERRING PROVIDER: Chryl Heck, CNM   REFERRING DIAG: diastasis recti  Rationale for Evaluation and Treatment Rehabilitation  THERAPY DIAG:  Sacrococcygeal disorders, not elsewhere classified  Other low back pain  Other abnormalities of gait and mobility  ONSET DATE:   SUBJECTIVE:       SUBJECTIVE STATEMENT TODAY:   Pt 's LBP is 80% better.  Groin B pain has resolved.    Pt reported she started PT at level of pelvic pain at 8-9/10. After the past Pelvic PT visits from 01/23/23 to 03/03/23, pt 's pain decreased to 5/10 and was able to return to  walking but had not returned to sit ups and core and weight lifting.    Currently, her stomach pain hurts with bending, loading laundry. When she went to the ED, she noticed cramping in her stomach and it hurt with stepping. It was located more in the stomach area rather than the pelvic area. Pt is scared to do anything than walking. Pt would like to get back to her exercises and not have her mommy pooch. Pt had looked up on line for exercises for diastasis and did them not on a consistent basis. To her, the exercises were too advanced for her. Pt tried them for 2 weeks stopped. Pt would like to get to pre pregnancy exercises by her birthday which is in January. Pt 's pre pregnancy exercises included:  running 2 miles x 3 x week, boxing 3 times  a week, core exercises: 75 crunches,  1 min planks, bicycles crunches 75 reps.                                                                                                                                           SUBJECTIVE STATEMENT ON EVAL 01/19/23  : 1) Pelvic pain at the groin area B:  with getting out of bed   2)  R  CLBP with the radiating pain to the back of the knee  Pt incurred scoliosis with the first child and tried PT but it did not help. From that she has experienced pinched nerves and knots . Pt has had shots for LBP.   Pain at its worst 7/10, at its best 5/10. Pain with sitting with soft / hard surface, getting out of bed, bending , picking infant, picking watermelon from floor,   PERTINENT HISTORY:  Athlete as a child and young adult, active at gym prior to first pregnancy 8 years ago ( included core workout, cardio, moderate weight lifting). Four years ago, pt started boxing with a trainer 2 x week.  Prior this pregnancy, pt did not exercise in the gym.  Currently, pt is walking with stroller 1 mile without stretches.   GYN Hx: both pregnancies without episiotomy / perineal scars.  Pt incurred scoliosis with the first child and tried PT but it  did not help. From that she has experienced pinched nerves and knots . Pt has had shots for LBP.    PAIN: see above   PRECAUTIONS: None  WEIGHT BEARING RESTRICTIONS: No  FALLS:  Has patient fallen in last 6 months? 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  Pt required manual Tx to address diastasis recti.   Pt still showed increased lumbar lordosis , requiring cues in new HEP .  Provided new HEP which addressed hip IR./ knee adduction/ tight feet instrinsic mm which will help with R SIJ / pubic pain. Pt demo'd correctly technique for new HEP post training and cues.   Pt benefits from skilled PT.    OBJECTIVE IMPAIRMENTS decreased activity tolerance, decreased coordination, decreased endurance, decreased mobility, difficulty walking, decreased ROM, decreased strength, decreased safety awareness, hypomobility, increased muscle spasms, impaired flexibility, improper body mechanics, postural dysfunction, and pain.    ACTIVITY LIMITATIONS  self-care,  sleep, home chores, work tasks    PARTICIPATION LIMITATIONS:  community, gym activities    PERSONAL FACTORS        are also affecting patient's functional outcome.    REHAB POTENTIAL: Good   CLINICAL DECISION MAKING: Evolving/moderate complexity   EVALUATION COMPLEXITY: Moderate    PATIENT EDUCATION:    Education details: Showed pt anatomy images. Explained muscles attachments/ connection, physiology of deep  core system/ spinal- thoracic-pelvis-lower kinetic chain as they relate to pt's presentation, Sx, and past Hx. Explained what and how these areas of deficits need to be restored to balance and function    See Therapeutic activity / neuromuscular re-education section  Answered pt's questions.   Person educated: Patient Education method: Explanation, Demonstration, Tactile cues, Verbal cues, and Handouts Education comprehension: verbalized understanding, returned demonstration, verbal cues required, tactile cues required, and needs further education     PLAN: PT FREQUENCY: 1x/week   PT DURATION: 10 weeks   PLANNED INTERVENTIONS: Therapeutic exercises, Therapeutic activity, Neuromuscular re-education, Balance training, Gait training, Patient/Family education, Self Care, Joint mobilization, Spinal mobilization, Moist heat, Taping, and Manual therapy, dry needling.   PLAN FOR NEXT SESSION: See clinical impression for plan     GOALS: Goals reviewed with patient? Yes  SHORT TERM GOALS: Target date: 02/16/2023    Pt will demo IND with HEP                    Baseline: Not IND            Goal status: INITIAL   LONG TERM GOALS: Target date: 03/30/2023    1.Pt will demo proper deep core coordination without chest breathing and optimal excursion of diaphragm/pelvic floor in order to promote spinal stability and pelvic floor function  Baseline: dyscoordination Goal status: INITIAL  2.  Pt will demo > 5 pt change on FOTO  to improve QOL and function Pelvic Pain baseline - 54 Lower score = better function    Lumber baseline  -  45  Higher score =  better function   Goal status: INITIAL  3.  Pt will demo proper body mechanics in against gravity tasks and ADLs  work tasks, fitness  to minimize straining pelvic floor / back                  Baseline: not IND, improper form that places strain on pelvic floor                Goal status: INITIAL    4. Pt will demo levelled pelvic  girdle and spine across 2 weeks in order to experience no pain with sitting with soft / hard surface, getting out of bed Baseline: L shoulder , R iliac crest lowered  Pain with sitting with soft / hard surface, getting out of bed, Goal status: INITIAL    5. Pt will demo decreased separation of diastasis to < 2 fingers width in order to promote stronger intraabdominal pressure system for spinal / postural stability and pelvic floor function  Baseline: 3 fingers width above umbilicus with head lift ,  Goal status: INITIAL   6. Pt will demo increased gait speed > 1.3 m/s with reciprocal gait pattern in order to ambulate safely in community and return to fitness routine   Baseline:1.14 m/s (excessive R pelvic sway, limited posterior rotaion fo L trunk)  Goal status: I     Mariane Masters, PT 04/20/2023, 8:37 AM

## 2023-04-21 ENCOUNTER — Ambulatory Visit
Admission: RE | Admit: 2023-04-21 | Discharge: 2023-04-21 | Disposition: A | Discharge status: Home / Self Care | Payer: Medicaid Other | Source: Ambulatory Visit | Attending: Obstetrics & Gynecology | Admitting: Obstetrics & Gynecology

## 2023-04-21 DIAGNOSIS — R102 Pelvic and perineal pain: Secondary | ICD-10-CM | POA: Insufficient documentation

## 2023-04-22 ENCOUNTER — Encounter: Payer: Self-pay | Admitting: Emergency Medicine

## 2023-04-22 ENCOUNTER — Other Ambulatory Visit: Payer: Self-pay

## 2023-04-22 ENCOUNTER — Emergency Department
Admission: EM | Admit: 2023-04-22 | Discharge: 2023-04-22 | Disposition: A | Discharge status: Home / Self Care | Payer: Medicaid Other | Attending: Emergency Medicine | Admitting: Emergency Medicine

## 2023-04-22 ENCOUNTER — Emergency Department: Payer: Medicaid Other

## 2023-04-22 DIAGNOSIS — S82842A Displaced bimalleolar fracture of left lower leg, initial encounter for closed fracture: Secondary | ICD-10-CM | POA: Insufficient documentation

## 2023-04-22 DIAGNOSIS — W108XXA Fall (on) (from) other stairs and steps, initial encounter: Secondary | ICD-10-CM | POA: Diagnosis not present

## 2023-04-22 DIAGNOSIS — W19XXXA Unspecified fall, initial encounter: Secondary | ICD-10-CM

## 2023-04-22 DIAGNOSIS — S99912A Unspecified injury of left ankle, initial encounter: Secondary | ICD-10-CM | POA: Diagnosis present

## 2023-04-22 DIAGNOSIS — S9305XA Dislocation of left ankle joint, initial encounter: Secondary | ICD-10-CM | POA: Insufficient documentation

## 2023-04-22 MED ORDER — OXYCODONE HCL 5 MG PO TABS: 5.0000 mg | ORAL_TABLET | Freq: Three times a day (TID) | ORAL | 0 refills | Status: DC | PRN

## 2023-04-22 MED ORDER — MORPHINE SULFATE (PF) 4 MG/ML IV SOLN
4.0000 mg | Freq: Once | INTRAVENOUS | Status: AC
  Administered 2023-04-22: 4 mg via INTRAVENOUS
  Filled 2023-04-22: qty 1

## 2023-04-22 MED ORDER — FENTANYL CITRATE PF 50 MCG/ML IJ SOSY
PREFILLED_SYRINGE | INTRAMUSCULAR | Status: AC
  Administered 2023-04-22: 50 ug
  Filled 2023-04-22: qty 1

## 2023-04-22 MED ORDER — PROPOFOL 10 MG/ML IV BOLUS
1.0000 mg/kg | Freq: Once | INTRAVENOUS | Status: AC
  Administered 2023-04-22: 103.4 mg via INTRAVENOUS
  Filled 2023-04-22: qty 20

## 2023-04-22 NOTE — Sedation Documentation (Signed)
PA Poggi and RN Elana applied splint to L ankle.

## 2023-04-22 NOTE — ED Provider Notes (Signed)
Shared visit  Complex left ankle fracture dislocation following a fall.  Discussed with podiatry who recommended procedural sedation and reduction.  See Jenna Po G's note for reduction.  Post reductive films obtained.  Patient will follow-up as an outpatient with podiatry.  .Sedation  Date/Time: 04/22/2023 3:28 PM  Performed by: Corena Herter, MD Authorized by: Corena Herter, MD   Consent:    Consent obtained:  Verbal   Consent given by:  Patient   Risks discussed:  Allergic reaction, dysrhythmia, inadequate sedation, nausea, prolonged hypoxia resulting in organ damage, prolonged sedation necessitating reversal, respiratory compromise necessitating ventilatory assistance and intubation and vomiting   Alternatives discussed:  Analgesia without sedation, anxiolysis and regional anesthesia Universal protocol:    Procedure explained and questions answered to patient or proxy's satisfaction: yes     Relevant documents present and verified: yes     Test results available: yes     Imaging studies available: yes     Required blood products, implants, devices, and special equipment available: yes     Site/side marked: yes     Immediately prior to procedure, a time out was called: yes     Patient identity confirmed:  Verbally with patient Indications:    Procedure necessitating sedation performed by:  Physician performing sedation Pre-sedation assessment:    Time since last food or drink:  This am   NPO status caution: urgency dictates proceeding with non-ideal NPO status     ASA classification: class 1 - normal, healthy patient     Mouth opening:  3 or more finger widths   Thyromental distance:  4 finger widths   Mallampati score:  I - soft palate, uvula, fauces, pillars visible   Neck mobility: normal     Pre-sedation assessments completed and reviewed: airway patency, cardiovascular function, hydration status, mental status, nausea/vomiting, pain level, respiratory function and  temperature     Pre-sedation assessment completed:  04/22/2023 2:18 PM Immediate pre-procedure details:    Reassessment: Patient reassessed immediately prior to procedure     Reviewed: vital signs, relevant labs/tests and NPO status     Verified: bag valve mask available, emergency equipment available, intubation equipment available, IV patency confirmed, oxygen available and suction available   Procedure details (see MAR for exact dosages):    Preoxygenation:  Nasal cannula   Sedation:  Propofol   Intended level of sedation: deep   Intra-procedure monitoring:  Blood pressure monitoring, cardiac monitor, continuous pulse oximetry, frequent LOC assessments, frequent vital sign checks and continuous capnometry   Intra-procedure events: none     Total Provider sedation time (minutes):  20 Post-procedure details:    Post-sedation assessment completed:  04/22/2023 3:29 PM   Attendance: Constant attendance by certified staff until patient recovered     Recovery: Patient returned to pre-procedure baseline     Post-sedation assessments completed and reviewed: airway patency, cardiovascular function, hydration status, mental status, nausea/vomiting, pain level, respiratory function and temperature     Patient is stable for discharge or admission: yes     Procedure completion:  Tolerated well, no immediate complications     Corena Herter, MD 04/22/23 1529

## 2023-04-22 NOTE — ED Notes (Signed)
Report called to [facility] Rn  Pt moved to room 7

## 2023-04-22 NOTE — ED Notes (Signed)
Pt given crutches ad educated on how to use crutches, pt denies anymore questions on crutch use. NAD noted. Pt awake and alert on D/C.   First encounter with this pt to D/C. VSS.

## 2023-04-22 NOTE — ED Provider Notes (Signed)
[facility] Provider Note    Event Date/Time   First MD Initiated Contact with Patient 04/22/23 1254     (approximate)   History   Fall   HPI  Olivia Frost is a 32 y.o. female who presents today for evaluation of left ankle injury.  Patient reports that she had a misstep off of 1 stair and twisted her ankle.  She reports that she fell to the ground but did not strike her head or lose consciousness.  She has not been able to bear weight since.  Her husband called the EMS who brought her here.  She denies numbness or tingling but reports that she is in a lot of pain.  She denies any concurrent injuries.  Patient Active Problem List   Diagnosis Date Noted   Intrauterine device 01/11/2023          Physical Exam   Triage Vital Signs: ED Triage Vitals  Encounter Vitals Group     BP 04/22/23 1300 130/83     Systolic BP Percentile --      Diastolic BP Percentile --      Pulse Rate 04/22/23 1300 74     Resp 04/22/23 1300 18     Temp 04/22/23 1300 98.4 F (36.9 C)     Temp src --      SpO2 04/22/23 1300 100 %     Weight 04/22/23 1255 227 lb 15.3 oz (103.4 kg)     Height 04/22/23 1255 5\' 8"  (1.727 m)     Head Circumference --      Peak Flow --      Pain Score --      Pain Loc --      Pain Education --      Exclude from Growth Chart --     Most recent vital signs: Vitals:   04/22/23 1545 04/22/23 1600  BP: 121/76 (!) 107/95  Pulse: 67 75  Resp: 18 19  Temp:    SpO2: 100% 100%    Physical Exam Vitals and nursing note reviewed.  Constitutional:      General: Awake and alert.  Uncomfortable appearing    Appearance: Normal appearance. The patient is overweight.  HENT:     Head: Normocephalic and atraumatic.     Mouth: Mucous membranes are moist.  Eyes:     General: PERRL. Normal EOMs        Right eye: No discharge.        Left eye: No discharge.     Conjunctiva/sclera: Conjunctivae normal.  Cardiovascular:     Rate and Rhythm:  Normal rate and regular rhythm.     Pulses: Normal pulses.  Pulmonary:     Effort: Pulmonary effort is normal. No respiratory distress.     Breath sounds: Normal breath sounds.  Abdominal:     Abdomen is soft. There is no abdominal tenderness. No rebound or guarding. No distention. Musculoskeletal:        General: No swelling. Normal range of motion.     Cervical back: Normal range of motion and neck supple.  No cervical spine tenderness, full and normal range of motion of neck Left ankle: Obvious deformity to left ankle. Superficial abrasion to anterior/medial ankle. Normal pedal pulse. Normal capillary refill. Compartments soft and compressible. No proximal fibular tenderness. No swelling or ecchymosis to foot No knee or hip pain.  Normal-appearing right lower extremity.  No vertebral tenderness.  No upper extremity abnormalities. Skin:  General: Skin is warm and dry.     Capillary Refill: Capillary refill takes less than 2 seconds.     Findings: No rash.  Neurological:     Mental Status: The patient is awake and alert.  No focal neurological deficits.     ED Results / Procedures / Treatments   Labs (all labs ordered are listed, but only abnormal results are displayed) Labs Reviewed - No data to display   EKG     RADIOLOGY I independently reviewed and interpreted imaging and agree with radiologists findings.     PROCEDURES:  Critical Care performed:   .Ortho Injury Treatment  Date/Time: 04/22/2023 5:45 PM  Performed by: Jackelyn Hoehn, PA-C Authorized by: Jackelyn Hoehn, PA-C   Consent:    Consent obtained:  Verbal   Consent given by:  Patient   Risks discussed:  Fracture, irreducible dislocation, nerve damage, recurrent dislocation, restricted joint movement, stiffness and vascular damage   Alternatives discussed:  Immobilization, referral and delayed treatmentInjury location: ankle Location details: left ankle Injury type: fracture-dislocation Fracture  type: bimalleolar Pre-procedure neurovascular assessment: neurovascularly intact Pre-procedure distal perfusion: normal Pre-procedure neurological function: normal Pre-procedure range of motion: reduced  Anesthesia: Local anesthesia used: no  Patient sedated: Yes. Refer to sedation procedure documentation for details of sedation. Manipulation performed: yes Reduction successful: yes X-ray confirmed reduction: yes Immobilization: splint Splint type: ankle stirrup (stirrup and posterior) Splint Applied by: ED Nurse and ED Provider Supplies used: cotton padding, Ortho-Glass and elastic bandage Post-procedure neurovascular assessment: post-procedure neurovascularly intact Post-procedure distal perfusion: normal Post-procedure neurological function: normal Post-procedure range of motion: improved Post-procedure range of motion comment: splinted      MEDICATIONS ORDERED IN ED: Medications  morphine (PF) 4 MG/ML injection 4 mg (4 mg Intravenous Given 04/22/23 1334)  propofol (DIPRIVAN) 10 mg/mL bolus/IV push 103.4 mg (103.4 mg Intravenous Given 04/22/23 1503)  fentaNYL (SUBLIMAZE) 50 MCG/ML injection (50 mcg  Given 04/22/23 1510)     IMPRESSION / MDM / ASSESSMENT AND PLAN / ED COURSE  I reviewed the triage vital signs and the nursing notes.   Differential diagnosis includes, but is not limited to, fracture, dislocation, maissoneuve, fracture-dislocation.  Patient is awake and alert, hemodynamically stable and neurovascularly intact with 2+ pedal pulses and normal capillary refill of all toes.  No signs of compartment syndrome at this time.  X-ray reveals complete dislocation of the tibiotalar joint with displaced fractures of the lower left fibular diaphysis and medial aspect of the lower left tibia with intra-articular extension. Patient was treated symptomatically with morphine.  This was discussed with Dr. Allena Katz with podiatry who recommends reduction/splinting and outpatient  follow-up.  Discussed with patient who is in agreement with plan.  Patient was discussed with Dr. Augustin Schooling who agreed to assist with conscious sedation.  Patient was moved to the main side of the emergency department for the conscious sedation.  Consent signed by patient and all questions were answered.  I reduced to the patient's ankle while Dr. Augustin Schooling performed the conscious sedation using propofol.  Patient remained neurovascularly intact after the reduction with 2+ pedal pulses and normal capillary refill.  She was placed in a stirrup and posterior splint.  Postreduction films reveal significant improvement of the alignment postreduction.  Dr. Allena Katz podiatry also reviewed the postreduction films and and reports that this was done satisfactorily.  Patient will see him next week in clinic.  I discussed with patient the importance of keeping her legs elevated to help with swelling.  She was instructed to remain nonweightbearing, and she was given crutches to assist with this.  She is currently breast-feeding and she was advised that the oxycodone will crossover into her breastmilk, so she was advised to not to give this breastmilk to her baby.  PDMP was reviewed prior to prescribing oxycodone.  She was also advised that oxycodone should only be used for breakthrough pain when Tylenol and ibuprofen do not work first because it is highly addictive.  She was also advised that she cannot drive, operate heavy machinery, or perform any test require concentration while taking this medicine.  Patient and her husband understand and agree with plan.  She was discharged in stable condition with her significant other..   Patient's presentation is most consistent with acute presentation with potential threat to life or bodily function.      FINAL CLINICAL IMPRESSION(S) / ED DIAGNOSES   Final diagnoses:  Closed dislocation of ankle, left, initial encounter  Closed bimalleolar fracture of left ankle, initial encounter   Fall, initial encounter     Rx / DC Orders   ED Discharge Orders          Ordered    oxyCODONE (ROXICODONE) 5 MG immediate release tablet  Every 8 hours PRN        04/22/23 1552             Note:  This document was prepared using Dragon voice recognition software and may include unintentional dictation errors.   Jackelyn Hoehn, PA-C 04/22/23 1747    Corena Herter, MD 04/23/23 [phone]

## 2023-04-22 NOTE — ED Notes (Signed)
Rad at bedside.

## 2023-04-22 NOTE — Discharge Instructions (Addendum)
You had an ankle fracture dislocation which was put back into place and splinted.  Your case was discussed with podiatry who would like to see you in their clinic next week.  Please remain nonweightbearing.  Keep your leg elevated is much as possible as we discussed.  Keep your splint clean and dry.  You may take the oxycodone as needed for breakthrough pain when Tylenol and ibuprofen do not work first.  Remember that this medication is highly addictive.  Also remember that this will crossover into breastmilk, so it is recommended that you pump and dump with this medicine.  Do not do any tasks that require concentration while taking this medication.  Please return the emergency department for any new, worsening, or change in symptoms or other concerns.  Was a pleasure caring for you today.

## 2023-04-22 NOTE — Sedation Documentation (Signed)
PA Poggi, MD Mumma, RN Elana, RN Jes, and RN Kellene Mccleary at bedside to perform procedure.

## 2023-04-22 NOTE — ED Triage Notes (Addendum)
Presents s/p fall  States she fell steps  twisted left ankle  Positive deformity    SL 20 g in right hand Fentanyl 

## 2023-04-23 ENCOUNTER — Telehealth: Payer: Self-pay | Admitting: Physical Therapy

## 2023-04-23 NOTE — Telephone Encounter (Signed)
Therapist called and spoke with pt who notified clinic that she had broken her ankle. Pt expressed she would like to cancel remaining Pelvic PT appts. Pt has a pending ankle surgery.   Therapists named the exercises to continue doing of her HEP in order to benefit pelvic related goals and also to help with postural stability post-ankle surgery.  Pt repeated back the exercises with understanding.   Therapist also advised adding counseling / therapy  as additional support for healing as pt had reported feeling sad that pain was preventing her from roles as a mother and wife.    Therapist explained that  EVen UP shoe which will be helpful to maintain pelvic alignment when she is wearing an orthopedic boot with ankle injury/ surgery. PT plans to email her info along with info for psychotherapists in the area.

## 2023-04-27 ENCOUNTER — Telehealth: Payer: Self-pay

## 2023-04-27 ENCOUNTER — Ambulatory Visit: Payer: Medicaid Other | Admitting: Podiatry

## 2023-04-27 ENCOUNTER — Encounter: Payer: Self-pay | Admitting: Podiatry

## 2023-04-27 VITALS — BP 127/85 | HR 70

## 2023-04-27 DIAGNOSIS — S82892A Other fracture of left lower leg, initial encounter for closed fracture: Secondary | ICD-10-CM

## 2023-04-27 DIAGNOSIS — M216X1 Other acquired deformities of right foot: Secondary | ICD-10-CM

## 2023-04-27 DIAGNOSIS — Z01818 Encounter for other preprocedural examination: Secondary | ICD-10-CM | POA: Diagnosis not present

## 2023-04-27 NOTE — Telephone Encounter (Signed)
Pt calling; wants to talk to a nurse.  [phone]  Pt states she has broken her foot and needs surgery; but before surgery she needs a form filled out by her primary care saying she can have the surgery; she doesn't have a primary and everyone she has talked to is booked to January.  Adv we are not a primary care; to go ahead and get established with one that is in network with her ins; in the meantime UC or HD may be able to clear her for surgery.  Wished her well.

## 2023-04-27 NOTE — Progress Notes (Signed)
Subjective:  Patient ID: Olivia Frost, female    DOB: 1991-03-27,  MRN: [id number]  Chief Complaint  Patient presents with   Fracture    "I broke and dislocated my left ankle."    32 y.o. female presents with the above complaint.  Plain of left trimalleolar anklePatient presents with.  Patient states that she fell down and broke and dislocated ankle.  Went to the emergency room where it was closed with dose and was sent for follow-up with me.  She has been nonweightbearing of the left lower extremity with crutches.  Pain scale 7 out of 10 dull aching nature no open wound or blister noted.  No abnormality noted minimal swelling noted.   Review of Systems: Negative except as noted in the HPI. Denies N/V/F/Ch.  Past Medical History:  Diagnosis Date   Anemia during pregnancy in third trimester 10/09/2022   COVID 03/26/2022   Migraines    Obesity in pregnancy 09/03/2022   Supervision of other normal pregnancy, antepartum 04/22/2022              Clinical Staff    Provider      Office Location     [facility]    Dating     11/21/2022, by Patient Reported         Language     English    Anatomy US     Normal       Flu Vaccine     offer    Genetic Screen     NIPS: Negative      TDaP vaccine      09/03/22    Hgb A1C or   GTT    Early :  Third trimester : 38      Covid    Has booster         LAB RESULTS       Rhogam     B/Positiv   SVD (spontaneous vaginal delivery) 10/07/2022   Trimalleolar fracture 03/2023   left    Current Outpatient Medications:    acetaminophen (TYLENOL) 500 MG tablet, Take 1,000 mg by mouth every 8 (eight) hours as needed (pain.)., Disp: , Rfl:    ibuprofen (ADVIL) 800 MG tablet, Take 800 mg by mouth every 8 (eight) hours as needed (pain.)., Disp: , Rfl:    ibuprofen (ADVIL) 800 MG tablet, Take 1 tablet (800 mg total) by mouth every 6 (six) hours as needed., Disp: 60 tablet, Rfl: 1   oxyCODONE (ROXICODONE) 5 MG immediate release tablet, Take 1 tablet (5 mg total) by  mouth every 8 (eight) hours as needed., Disp: 10 tablet, Rfl: 0   oxyCODONE-acetaminophen (PERCOCET) 5-325 MG tablet, Take 1 tablet by mouth every 4 (four) hours as needed for severe pain (pain score 7-10)., Disp: 30 tablet, Rfl: 0  Social History   Tobacco Use  Smoking Status Never  Smokeless Tobacco Never    Allergies  Allergen Reactions   Latex Rash   Objective:   Vitals:   04/27/23 0844  BP: 127/85  Pulse: 70   There is no height or weight on file to calculate BMI. Constitutional Well developed. Well nourished.  Vascular Dorsalis pedis pulses palpable bilaterally. Posterior tibial pulses palpable bilaterally. Capillary refill normal to all digits.  No cyanosis or clubbing noted. Pedal hair growth normal.  Neurologic Normal speech. Oriented to person, place, and time. Epicritic sensation to light touch grossly present bilaterally.  Dermatologic Nails well groomed and normal in appearance. No open  wounds. No skin lesions.  Orthopedic: Pain on palpation to the left ankle no ecchymosis bruising noted.  Some swelling noted.  No fracture blisters noted.  No open wounds or lesion noted.   Radiographs: 3 views of skeletally mature adult left foot: Trimalleolar ankle fracture noted with increasing medial clear space without fracture of the medial malleolus.  Small posterior malleolus fracture noted and fibula fracture noted. Assessment:  No diagnosis found. Plan:  Patient was evaluated and treated and all questions answered.  Left trimalleolar ankle fracture with underlying syndesmotic disruption -All questions and concerns were discussed with the patient in extensive detail -Given the deformity of the fracture in setting of unstable ankle fracture patient will benefit from surgical fixation of the fracture.  Without medial malleolus fracture I believe she will need syndesmotic repair as well.  I discussed my preoperative intra postop plan with the patient and extensive  detail.  She would benefit from open reduction internal fixation of the left ankle fracture with syndesmotic tight rope -Informed surgical risk consent was reviewed and read aloud to the patient.  I reviewed the films.  I have discussed my findings with the patient in great detail.  I have discussed all risks including but not limited to infection, stiffness, scarring, limp, disability, deformity, damage to blood vessels and nerves, numbness, poor healing, need for braces, arthritis, chronic pain, amputation, death.  All benefits and realistic expectations discussed in great detail.  I have made no promises as to the outcome.  I have provided realistic expectations.  I have offered the patient a 2nd opinion, which they have declined and assured me they preferred to proceed despite the risks   No follow-ups on file.

## 2023-04-28 ENCOUNTER — Ambulatory Visit: Payer: Medicaid Other | Admitting: Physical Therapy

## 2023-04-28 ENCOUNTER — Telehealth: Payer: Self-pay | Admitting: Podiatry

## 2023-04-28 NOTE — Telephone Encounter (Signed)
DOS- 05/04/2023  LEFT ORIF OF ANKLE- [id number]  [facility] EFFECTIVE DATE-  01/28/2023  SPOKE WITH NIEKA F. FROM [facility] AND SHE STATED THAT FOR CPT CODES 10272 AND [phone] THAT PRIOR AUTH IS NOT REQUIRED.  CALL REFERENCE NUMBER: [phone]

## 2023-04-30 ENCOUNTER — Encounter
Admission: RE | Admit: 2023-04-30 | Discharge: 2023-04-30 | Disposition: A | Discharge status: Home / Self Care | Payer: Medicaid Other | Source: Ambulatory Visit | Attending: Podiatry

## 2023-04-30 VITALS — Ht 68.0 in | Wt 228.0 lb

## 2023-04-30 DIAGNOSIS — Z01818 Encounter for other preprocedural examination: Secondary | ICD-10-CM

## 2023-04-30 HISTORY — DX: Migraine, unspecified, not intractable, without status migrainosus: G43.909

## 2023-04-30 NOTE — Patient Instructions (Signed)
Your procedure is scheduled on:05-04-23 Tuesday Report to the Registration Desk on the 1st floor of the [facility].Then proceed to the 2nd floor Surgery Desk To find out your arrival time, please call [phone] between 1PM - 3PM on:05-03-23 Monday If your arrival time is 6:00 am, do not arrive before that time as the [facility] entrance doors do not open until 6:00 am.  REMEMBER: Instructions that are not followed completely may result in serious medical risk, up to and including death; or upon the discretion of your surgeon and anesthesiologist your surgery may need to be rescheduled.  Do not eat food after midnight the night before surgery.  No gum chewing or hard candies.  You may however, drink CLEAR liquids up to 2 hours before you are scheduled to arrive for your surgery. Do not drink anything within 2 hours of your scheduled arrival time.  Clear liquids include: - water  - apple juice without pulp - gatorade (not RED colors) - black coffee or tea (Do NOT add milk or creamers to the coffee or tea) Do NOT drink anything that is not on this list.  One week prior to surgery: Stop Anti-inflammatories (NSAIDS) such as Advil, Aleve, Ibuprofen, Motrin, Naproxen, Naprosyn and Aspirin based products such as Excedrin, Goody's Powder, BC Powder. Stop ANY OVER THE COUNTER supplements until after surgery.  You may however, continue to take Tylenol if needed for pain up until the day of surgery.  Continue taking all of your other prescription medications up until the day of surgery.  ON THE DAY OF SURGERY ONLY TAKE THESE MEDICATIONS WITH SIPS OF WATER: -You may take oxyCODONE (ROXICODONE) for pain if needed  No Alcohol for 24 hours before or after surgery.  No Smoking including e-cigarettes for 24 hours before surgery.  No chewable tobacco products for at least 6 hours before surgery.  No nicotine patches on the day of surgery.  Do not use any "recreational" drugs for at least a  week (preferably 2 weeks) before your surgery.  Please be advised that the combination of cocaine and anesthesia may have negative outcomes, up to and including death. If you test positive for cocaine, your surgery will be cancelled.  On the morning of surgery brush your teeth with toothpaste and water, you may rinse your mouth with mouthwash if you wish. Do not swallow any toothpaste or mouthwash.  Use CHG wipes as directed on instruction sheet.  Do not wear jewelry, make-up, hairpins, clips or nail polish.  For welded (permanent) jewelry: bracelets, anklets, waist bands, etc.  Please have this removed prior to surgery.  If it is not removed, there is a chance that hospital personnel will need to cut it off on the day of surgery.  Do not wear lotions, powders, or perfumes.   Do not shave body hair from the neck down 48 hours before surgery.  Contact lenses, hearing aids and dentures may not be worn into surgery.  Do not bring valuables to the hospital. [facility] is not responsible for any missing/lost belongings or valuables.  Notify your doctor if there is any change in your medical condition (cold, fever, infection).  Wear comfortable clothing (specific to your surgery type) to the hospital.  After surgery, you can help prevent lung complications by doing breathing exercises.  Take deep breaths and cough every 1-2 hours. Your doctor may order a device called an Incentive Spirometer to help you take deep breaths. When coughing or sneezing, hold a pillow firmly  against your incision with both hands. This is called "splinting." Doing this helps protect your incision. It also decreases belly discomfort.  If you are being admitted to the hospital overnight, leave your suitcase in the car. After surgery it may be brought to your room.  In case of increased patient census, it may be necessary for you, the patient, to continue your postoperative care in the Same Day Surgery  department.  If you are being discharged the day of surgery, you will not be allowed to drive home. You will need a responsible individual to drive you home and stay with you for 24 hours after surgery.   If you are taking public transportation, you will need to have a responsible individual with you.  Please call the Pre-admissions Testing Dept. at [phone] if you have any questions about these instructions.  Surgery Visitation Policy:  Patients having surgery or a procedure may have two visitors.  Children under the age of 70 must have an adult with them who is not the patient.  Preparing the Skin Before Surgery     To help prevent the risk of infection at your surgical site, we are now providing you with rinse-free Sage 2% Chlorhexidine Gluconate (CHG) disposable wipes.  Chlorhexidine Gluconate (CHG) Soap  o An antiseptic cleaner that kills germs and bonds with the skin to continue killing germs even after washing  o Used for showering the night before surgery and morning of surgery  The night before surgery: Shower or bathe with warm water. Do not apply perfume, lotions, powders. Wait one hour after shower. Skin should be dry and cool. Open Sage wipe package - use 6 disposable cloths. Wipe body using one cloth for the right arm, one cloth for the left arm, one cloth for the right leg, one cloth for the left leg, one cloth for the chest/abdomen area, and one cloth for the back. Do not use on open wounds or sores. Do not use on face or genitals (private parts). If you are breast feeding, do not use on breasts. 5. Do not rinse, allow to dry. 6. Skin may feel "tacky" for several minutes. 7. Dress in clean clothes. 8. Place clean sheets on your bed and do not sleep with pets.  REPEAT ABOVE ON THE MORNING OF SURGERY BEFORE ARRIVING TO THE HOSPITAL.

## 2023-05-03 MED ORDER — LACTATED RINGERS IV SOLN: INTRAVENOUS | Status: DC

## 2023-05-03 MED ORDER — CHLORHEXIDINE GLUCONATE 0.12 % MT SOLN
15.0000 mL | Freq: Once | OROMUCOSAL | Status: AC
  Administered 2023-05-04: 15 mL via OROMUCOSAL

## 2023-05-03 MED ORDER — ORAL CARE MOUTH RINSE: 15.0000 mL | Freq: Once | OROMUCOSAL | Status: AC

## 2023-05-03 MED ORDER — CEFAZOLIN SODIUM-DEXTROSE 2-4 GM/100ML-% IV SOLN
2.0000 g | Freq: Once | INTRAVENOUS | Status: AC
  Administered 2023-05-04: 2 g via INTRAVENOUS

## 2023-05-04 ENCOUNTER — Encounter: Payer: Self-pay | Admitting: Podiatry

## 2023-05-04 ENCOUNTER — Other Ambulatory Visit: Payer: Self-pay

## 2023-05-04 ENCOUNTER — Ambulatory Visit
Admission: RE | Admit: 2023-05-04 | Discharge: 2023-05-04 | Disposition: A | Discharge status: Home / Self Care | Payer: Medicaid Other | Attending: Podiatry | Admitting: Podiatry

## 2023-05-04 ENCOUNTER — Ambulatory Visit: Payer: Medicaid Other

## 2023-05-04 ENCOUNTER — Encounter: Payer: Medicaid Other | Admitting: Physical Therapy

## 2023-05-04 ENCOUNTER — Encounter
Admission: RE | Disposition: A | Discharge status: Home / Self Care | Payer: Self-pay | Source: Home / Self Care | Attending: Podiatry

## 2023-05-04 ENCOUNTER — Other Ambulatory Visit: Payer: Self-pay | Admitting: Podiatry

## 2023-05-04 ENCOUNTER — Ambulatory Visit: Payer: Medicaid Other | Admitting: Urgent Care

## 2023-05-04 ENCOUNTER — Ambulatory Visit: Payer: Self-pay | Admitting: Urgent Care

## 2023-05-04 DIAGNOSIS — S82852A Displaced trimalleolar fracture of left lower leg, initial encounter for closed fracture: Secondary | ICD-10-CM | POA: Insufficient documentation

## 2023-05-04 DIAGNOSIS — Z01818 Encounter for other preprocedural examination: Secondary | ICD-10-CM

## 2023-05-04 DIAGNOSIS — M216X1 Other acquired deformities of right foot: Secondary | ICD-10-CM | POA: Diagnosis not present

## 2023-05-04 DIAGNOSIS — S82851A Displaced trimalleolar fracture of right lower leg, initial encounter for closed fracture: Secondary | ICD-10-CM | POA: Diagnosis not present

## 2023-05-04 DIAGNOSIS — Z8616 Personal history of COVID-19: Secondary | ICD-10-CM | POA: Insufficient documentation

## 2023-05-04 DIAGNOSIS — S93432A Sprain of tibiofibular ligament of left ankle, initial encounter: Secondary | ICD-10-CM | POA: Diagnosis not present

## 2023-05-04 DIAGNOSIS — X58XXXA Exposure to other specified factors, initial encounter: Secondary | ICD-10-CM | POA: Diagnosis not present

## 2023-05-04 HISTORY — PX: ORIF ANKLE FRACTURE: SHX5408

## 2023-05-04 LAB — POCT PREGNANCY, URINE: Preg Test, Ur: NEGATIVE

## 2023-05-04 SURGERY — OPEN REDUCTION INTERNAL FIXATION (ORIF) ANKLE FRACTURE
Anesthesia: General | Site: Ankle | Laterality: Left

## 2023-05-04 MED ORDER — FENTANYL CITRATE (PF) 100 MCG/2ML IJ SOLN
INTRAMUSCULAR | Status: AC
  Filled 2023-05-04: qty 2

## 2023-05-04 MED ORDER — FENTANYL CITRATE (PF) 100 MCG/2ML IJ SOLN
INTRAMUSCULAR | Status: DC | PRN
  Administered 2023-05-04: 50 ug via INTRAVENOUS
  Administered 2023-05-04 (×2): 25 ug via INTRAVENOUS

## 2023-05-04 MED ORDER — OXYCODONE HCL 5 MG PO TABS
ORAL_TABLET | ORAL | Status: AC
  Filled 2023-05-04: qty 1

## 2023-05-04 MED ORDER — LIDOCAINE HCL (PF) 1 % IJ SOLN
INTRAMUSCULAR | Status: DC | PRN
  Administered 2023-05-04 (×2): 2.5 mL via SUBCUTANEOUS

## 2023-05-04 MED ORDER — PROPOFOL 10 MG/ML IV BOLUS
INTRAVENOUS | Status: AC
  Filled 2023-05-04: qty 20

## 2023-05-04 MED ORDER — MIDAZOLAM HCL 2 MG/2ML IJ SOLN
INTRAMUSCULAR | Status: AC
  Filled 2023-05-04: qty 2

## 2023-05-04 MED ORDER — ACETAMINOPHEN 10 MG/ML IV SOLN
INTRAVENOUS | Status: AC
  Filled 2023-05-04: qty 100

## 2023-05-04 MED ORDER — CEFAZOLIN SODIUM-DEXTROSE 2-4 GM/100ML-% IV SOLN
INTRAVENOUS | Status: AC
  Filled 2023-05-04: qty 100

## 2023-05-04 MED ORDER — PHENYLEPHRINE 80 MCG/ML (10ML) SYRINGE FOR IV PUSH (FOR BLOOD PRESSURE SUPPORT)
PREFILLED_SYRINGE | INTRAVENOUS | Status: DC | PRN
  Administered 2023-05-04 (×4): 80 ug via INTRAVENOUS

## 2023-05-04 MED ORDER — BUPIVACAINE HCL (PF) 0.5 % IJ SOLN
INTRAMUSCULAR | Status: DC | PRN
  Administered 2023-05-04 (×2): 10 mL via PERINEURAL

## 2023-05-04 MED ORDER — OXYCODONE-ACETAMINOPHEN 5-325 MG PO TABS: 1.0000 | ORAL_TABLET | ORAL | 0 refills | Status: DC | PRN

## 2023-05-04 MED ORDER — FENTANYL CITRATE PF 50 MCG/ML IJ SOSY
PREFILLED_SYRINGE | INTRAMUSCULAR | Status: AC
  Filled 2023-05-04: qty 1

## 2023-05-04 MED ORDER — DEXAMETHASONE SODIUM PHOSPHATE 10 MG/ML IJ SOLN
INTRAMUSCULAR | Status: DC | PRN
  Administered 2023-05-04: 10 mg via INTRAVENOUS

## 2023-05-04 MED ORDER — LIDOCAINE HCL (PF) 2 % IJ SOLN
INTRAMUSCULAR | Status: AC
  Filled 2023-05-04: qty 5

## 2023-05-04 MED ORDER — MORPHINE SULFATE (PF) 4 MG/ML IV SOLN: 1.0000 mg | INTRAVENOUS | Status: DC | PRN

## 2023-05-04 MED ORDER — DEXAMETHASONE SODIUM PHOSPHATE 10 MG/ML IJ SOLN
INTRAMUSCULAR | Status: AC
  Filled 2023-05-04: qty 1

## 2023-05-04 MED ORDER — ONDANSETRON HCL 4 MG/2ML IJ SOLN
INTRAMUSCULAR | Status: DC | PRN
  Administered 2023-05-04: 4 mg via INTRAVENOUS

## 2023-05-04 MED ORDER — MIDAZOLAM HCL 2 MG/2ML IJ SOLN
1.0000 mg | INTRAMUSCULAR | Status: AC | PRN
  Administered 2023-05-04: 2 mg via INTRAVENOUS
  Administered 2023-05-04: 1 mg via INTRAVENOUS

## 2023-05-04 MED ORDER — BUPIVACAINE LIPOSOME 1.3 % IJ SUSP
INTRAMUSCULAR | Status: DC | PRN
  Administered 2023-05-04 (×2): 10 mL via PERINEURAL

## 2023-05-04 MED ORDER — 0.9 % SODIUM CHLORIDE (POUR BTL) OPTIME
TOPICAL | Status: DC | PRN
  Administered 2023-05-04: 500 mL

## 2023-05-04 MED ORDER — FENTANYL CITRATE (PF) 100 MCG/2ML IJ SOLN
25.0000 ug | INTRAMUSCULAR | Status: DC | PRN
  Administered 2023-05-04 (×2): 50 ug via INTRAVENOUS

## 2023-05-04 MED ORDER — MORPHINE SULFATE (PF) 4 MG/ML IV SOLN
INTRAVENOUS | Status: AC
  Filled 2023-05-04: qty 1

## 2023-05-04 MED ORDER — LIDOCAINE HCL (CARDIAC) PF 100 MG/5ML IV SOSY
PREFILLED_SYRINGE | INTRAVENOUS | Status: DC | PRN
  Administered 2023-05-04: 100 mg via INTRAVENOUS

## 2023-05-04 MED ORDER — BUPIVACAINE HCL (PF) 0.5 % IJ SOLN
INTRAMUSCULAR | Status: AC
  Filled 2023-05-04: qty 20

## 2023-05-04 MED ORDER — TRANEXAMIC ACID-NACL 1000-0.7 MG/100ML-% IV SOLN: INTRAVENOUS | Status: DC | PRN

## 2023-05-04 MED ORDER — ACETAMINOPHEN 10 MG/ML IV SOLN
INTRAVENOUS | Status: DC | PRN
  Administered 2023-05-04: 1000 mg via INTRAVENOUS

## 2023-05-04 MED ORDER — MORPHINE SULFATE (PF) 4 MG/ML IV SOLN
1.0000 mg | INTRAVENOUS | Status: AC | PRN
  Administered 2023-05-04 (×2): 1 mg via INTRAVENOUS

## 2023-05-04 MED ORDER — PROPOFOL 10 MG/ML IV BOLUS
INTRAVENOUS | Status: DC | PRN
  Administered 2023-05-04: 200 mg via INTRAVENOUS

## 2023-05-04 MED ORDER — ONDANSETRON HCL 4 MG/2ML IJ SOLN
INTRAMUSCULAR | Status: AC
  Filled 2023-05-04: qty 2

## 2023-05-04 MED ORDER — BUPIVACAINE LIPOSOME 1.3 % IJ SUSP
INTRAMUSCULAR | Status: AC
  Filled 2023-05-04: qty 20

## 2023-05-04 MED ORDER — LIDOCAINE HCL (PF) 1 % IJ SOLN
INTRAMUSCULAR | Status: AC
  Filled 2023-05-04: qty 5

## 2023-05-04 MED ORDER — PHENYLEPHRINE 80 MCG/ML (10ML) SYRINGE FOR IV PUSH (FOR BLOOD PRESSURE SUPPORT)
PREFILLED_SYRINGE | INTRAVENOUS | Status: AC
  Filled 2023-05-04: qty 10

## 2023-05-04 MED ORDER — TRANEXAMIC ACID-NACL 1000-0.7 MG/100ML-% IV SOLN
INTRAVENOUS | Status: AC
  Filled 2023-05-04: qty 100

## 2023-05-04 MED ORDER — EPHEDRINE SULFATE-NACL 50-0.9 MG/10ML-% IV SOSY
PREFILLED_SYRINGE | INTRAVENOUS | Status: DC | PRN
  Administered 2023-05-04 (×2): 5 mg via INTRAVENOUS

## 2023-05-04 MED ORDER — DROPERIDOL 2.5 MG/ML IJ SOLN: 0.6250 mg | Freq: Once | INTRAMUSCULAR | Status: DC | PRN

## 2023-05-04 MED ORDER — FENTANYL CITRATE PF 50 MCG/ML IJ SOSY
50.0000 ug | PREFILLED_SYRINGE | Freq: Once | INTRAMUSCULAR | Status: AC
  Administered 2023-05-04: 50 ug via INTRAVENOUS

## 2023-05-04 MED ORDER — IBUPROFEN 800 MG PO TABS: 800.0000 mg | ORAL_TABLET | Freq: Four times a day (QID) | ORAL | 1 refills | Status: DC | PRN

## 2023-05-04 MED ORDER — CHLORHEXIDINE GLUCONATE 0.12 % MT SOLN
OROMUCOSAL | Status: AC
  Filled 2023-05-04: qty 15

## 2023-05-04 MED ORDER — EPHEDRINE 5 MG/ML INJ
INTRAVENOUS | Status: AC
  Filled 2023-05-04: qty 5

## 2023-05-04 MED ORDER — OXYCODONE HCL 5 MG PO TABS
10.0000 mg | ORAL_TABLET | Freq: Once | ORAL | Status: AC
  Administered 2023-05-04: 10 mg via ORAL

## 2023-05-04 SURGICAL SUPPLY — 63 items
2.7x 12 mm NL screw (Screw) IMPLANT
APL PRP STRL LF DISP 70% ISPRP (MISCELLANEOUS) ×1
BIT DRILL 2 QR W/DEPTH MARKS (BIT) ×1
BIT DRILL 2.7 QR (BIT) ×1
BIT DRILL 2.8 QR W/DEPTH MARKS (BIT) IMPLANT
BIT DRILL 2MM QR W/DEPTH MARKS (BIT) IMPLANT
BIT DRILL SURGIBIT 2.7MM QR (BIT) IMPLANT
BNDG CMPR 5X4 CHSV STRCH STRL (GAUZE/BANDAGES/DRESSINGS) ×1
BNDG CMPR STD VLCR NS LF 5.8X4 (GAUZE/BANDAGES/DRESSINGS) ×1
BNDG COHESIVE 4X5 TAN STRL LF (GAUZE/BANDAGES/DRESSINGS) ×1 IMPLANT
BNDG ELASTIC 4X5.8 VLCR NS LF (GAUZE/BANDAGES/DRESSINGS) ×1 IMPLANT
BNDG ESMARCH 4 X 12 STRL LF (GAUZE/BANDAGES/DRESSINGS) ×1
BNDG ESMARCH 4X12 STRL LF (GAUZE/BANDAGES/DRESSINGS) ×1 IMPLANT
BNDG GAUZE DERMACEA FLUFF 4 (GAUZE/BANDAGES/DRESSINGS) ×1 IMPLANT
BNDG GZE DERMACEA 4 6PLY (GAUZE/BANDAGES/DRESSINGS) ×1
BOOT STEPPER DURA MED (SOFTGOODS) IMPLANT
CHLORAPREP W/TINT 26 (MISCELLANEOUS) ×1 IMPLANT
CUFF TOURN SGL QUICK 18X4 (TOURNIQUET CUFF) IMPLANT
CUFF TOURN SGL QUICK 24 (TOURNIQUET CUFF) ×1
CUFF TRNQT CYL 24X4X16.5-23 (TOURNIQUET CUFF) IMPLANT
DRAPE C-ARM 42X70 (DRAPES) ×1 IMPLANT
DRAPE C-ARMOR (DRAPES) ×1 IMPLANT
ELECT REM PT RETURN 9FT ADLT (ELECTROSURGICAL) ×1
ELECTRODE REM PT RTRN 9FT ADLT (ELECTROSURGICAL) ×1 IMPLANT
GAUZE SPONGE 4X4 12PLY STRL (GAUZE/BANDAGES/DRESSINGS) ×1 IMPLANT
GAUZE XEROFORM 1X8 LF (GAUZE/BANDAGES/DRESSINGS) ×1 IMPLANT
GLOVE BIO SURGEON STRL SZ7.5 (GLOVE) ×1 IMPLANT
GLOVE SURG SYN 7.0 (GLOVE) ×1
GLOVE SURG SYN 7.0 PF PI (GLOVE) ×1 IMPLANT
GOWN STRL REUS W/ TWL LRG LVL3 (GOWN DISPOSABLE) ×1 IMPLANT
GOWN STRL REUS W/ TWL XL LVL3 (GOWN DISPOSABLE) ×1 IMPLANT
GOWN STRL REUS W/TWL LRG LVL3 (GOWN DISPOSABLE) ×1
GOWN STRL REUS W/TWL XL LVL3 (GOWN DISPOSABLE) ×1
KIT REPAIR ANKLE SYNDESMOSIS (Ankle) IMPLANT
KIT TURNOVER KIT A (KITS) ×1 IMPLANT
LABEL OR SOLS (LABEL) ×1 IMPLANT
MANIFOLD NEPTUNE II (INSTRUMENTS) ×1 IMPLANT
NDL HYPO 22X1.5 SAFETY MO (MISCELLANEOUS) ×1 IMPLANT
NEEDLE HYPO 22X1.5 SAFETY MO (MISCELLANEOUS) ×1
NS IRRIG 500ML POUR BTL (IV SOLUTION) ×1 IMPLANT
PACK EXTREMITY ARMC (MISCELLANEOUS) ×1 IMPLANT
PAD ABD DERMACEA PRESS 5X9 (GAUZE/BANDAGES/DRESSINGS) ×1 IMPLANT
PAD PREP OB/GYN DISP 24X41 (PERSONAL CARE ITEMS) ×1 IMPLANT
PLATE LATERAL FIBULA 9-H L (Plate) IMPLANT
SCREW HEXALOBE NON-LOCK 3.5X14 (Screw) IMPLANT
SCREW HEXALOBE VA 3.5X12 (Screw) IMPLANT
SCREW NON LOCK 2.7X16 (Screw) IMPLANT
SCREW NONLOCK HEX 2.7X12 (Screw) IMPLANT
SCREW NONLOCK HEX 3.5X12 (Screw) IMPLANT
SCREW VA 2.7X12 (Screw) IMPLANT
STAPLER SKIN PROX 35W (STAPLE) IMPLANT
STOCKINETTE M/LG 89821 (MISCELLANEOUS) ×1 IMPLANT
STRAP SAFETY 5IN WIDE (MISCELLANEOUS) ×1 IMPLANT
SUT MNCRL AB 3-0 PS2 27 (SUTURE) ×1 IMPLANT
SUT MNCRL AB 4-0 PS2 18 (SUTURE) ×1 IMPLANT
SUT MON AB 2-0 CT1 36 (SUTURE) IMPLANT
SUT MON AB 5-0 P3 18 (SUTURE) ×1 IMPLANT
SUT STEEL 5 (SUTURE) IMPLANT
SYR 10ML LL (SYRINGE) ×1 IMPLANT
TRAP FLUID SMOKE EVACUATOR (MISCELLANEOUS) ×1 IMPLANT
WATER STERILE IRR 500ML POUR (IV SOLUTION) ×1 IMPLANT
WIRE PLATE TACK (WIRE) IMPLANT
surgical steel 5 (7.0 Metric) Gauge 20 (Wire) IMPLANT

## 2023-05-04 NOTE — Anesthesia Procedure Notes (Signed)
Anesthesia Regional Block: Popliteal block   Pre-Anesthetic Checklist: , timeout performed,  Correct Patient, Correct Site, Correct Laterality,  Correct Procedure, Correct Position, site marked,  Risks and benefits discussed,  Surgical consent,  Pre-op evaluation,  At surgeon's request and post-op pain management  Laterality: Lower and Left  Prep: chloraprep       Needles:  Injection technique: Single-shot  Needle Type: Echogenic Needle     Needle Length: 9cm  Needle Gauge: 21     Additional Needles:   Procedures:,,,, ultrasound used (permanent image in chart),,    Narrative:  Start time: 05/04/2023 11:30 AM End time: 05/04/2023 11:33 AM Injection made incrementally with aspirations every 5 mL.  Performed by: Personally  Anesthesiologist: Lenard Simmer, MD  Additional Notes: Patient consented for risk and benefits of nerve block including but not limited to nerve damage, failed block, bleeding and infection.  Patient voiced understanding.  Functioning IV was confirmed and monitors were applied.  Timeout done prior to procedure and prior to any sedation being given to the patient.  Patient confirmed procedure site prior to any sedation given to the patient.  A 50mm 22ga Stimuplex needle was used. Sterile prep,hand hygiene and sterile gloves were used.  Minimal sedation used for procedure.  No paresthesia endorsed by patient during the procedure.  Negative aspiration and negative test dose prior to incremental administration of local anesthetic. The patient tolerated the procedure well with no immediate complications.

## 2023-05-04 NOTE — Anesthesia Procedure Notes (Signed)
Anesthesia Regional Block: Other ([place])   Pre-Anesthetic Checklist: , timeout performed,  Correct Patient, Correct Site, Correct Laterality,  Correct Procedure, Correct Position, site marked,  Risks and benefits discussed,  Surgical consent,  Pre-op evaluation,  At surgeon's request and post-op pain management  Laterality: Lower and Left  Prep: chloraprep       Needles:  Injection technique: Single-shot  Needle Type: Echogenic Needle     Needle Length: 9cm  Needle Gauge: 21     Additional Needles:   Procedures:,,,, ultrasound used (permanent image in chart),,    Narrative:  Start time: 05/04/2023 11:36 AM End time: 05/04/2023 11:37 AM Injection made incrementally with aspirations every 5 mL.  Performed by: Personally  Anesthesiologist: Lenard Simmer, MD

## 2023-05-04 NOTE — Discharge Instructions (Signed)
After Surgery Instructions   1) If you are recuperating from surgery anywhere other than home, please be sure to leave [place] the number where you can be reached.  2) Go directly home and rest.  3) Keep the operated foot(feet) elevated six inches above the hip when sitting or lying down. This will help control swelling and pain.  4) Support the elevated foot and leg with pillows. DO NOT PLACE PILLOWS UNDER THE KNEE.  5) DO NOT REMOVE or get your bandages WET, unless you were given different instructions by your doctor to do so. This increases the risk of infection.  6) Wear your surgical shoe or surgical boot at all times when you are up on your feet.  7) A limited amount of pain and swelling may occur. The skin may take on a bruised appearance. DO NOT BE ALARMED, THIS IS NORMAL.  8) For slight pain and swelling, apply an ice pack directly over the bandages for 15 minutes only out of each hour of the day. Continue until seen in the office for your first post op visit. DON NOT     APPLY ANY FORM OF HEAT TO THE AREA.  9) Have prescriptions filled immediately and take as directed.  10) Drink lots of liquids, water and juice to stay hydrated.  11) CALL IMMEDIATELY IF:  *Bleeding continues until the following day of surgery  *Pain increases and/or does not respond to medication  *Bandages or cast appears to tight  *If your bandage gets wet  *Trip, fall or stump your surgical foot  *If your temperature goes above 101  *If you have ANY questions at all  [place]. ADHERING TO THESE INSTRUCTIONS WILL OFFER YOU THE MOST COMPLETE RESULTS

## 2023-05-04 NOTE — Anesthesia Preprocedure Evaluation (Signed)
Anesthesia Evaluation  Patient identified by MRN, date of birth, ID band Patient awake    Reviewed: Allergy & Precautions, H&P , NPO status , Patient's Chart, lab work & pertinent test results, reviewed documented beta blocker date and time   History of Anesthesia Complications Negative for: history of anesthetic complications  Airway Mallampati: II  TM Distance: >3 FB Neck ROM: full    Dental  (+) Dental Advidsory Given, Teeth Intact   Pulmonary neg pulmonary ROS, Continuous Positive Airway Pressure Ventilation    Pulmonary exam normal breath sounds clear to auscultation       Cardiovascular Exercise Tolerance: Good negative cardio ROS Normal cardiovascular exam Rhythm:regular Rate:Normal     Neuro/Psych negative neurological ROS  negative psych ROS   GI/Hepatic negative GI ROS, Neg liver ROS,,,  Endo/Other  negative endocrine ROS    Renal/GU negative Renal ROS  negative genitourinary   Musculoskeletal   Abdominal   Peds  Hematology negative hematology ROS (+)   Anesthesia Other Findings Past Medical History: 10/09/2022: Anemia during pregnancy in third trimester 03/26/2022: COVID No date: Migraines 09/03/2022: Obesity in pregnancy 04/22/2022: Supervision of other normal pregnancy, antepartum     Comment:            Clinical Staff    Provider      Office               Location     [facility]    Dating     11/21/2022, by               Patient Reported        Language     English    Anatomy               [place]     Normal       Flu Vaccine     offer    Genetic               Screen     NIPS: Negative      TDaP vaccine      09/03/22              Hgb A1C or   GTT    Early :  Third trimester : 63                    Covid    Has booster         LAB RESULTS       Rhogam                  B/Positiv 10/07/2022: SVD (spontaneous vaginal delivery) 03/2023: Trimalleolar fracture     Comment:  left  Obesity    Reproductive/Obstetrics negative OB ROS                             Anesthesia Physical Anesthesia Plan  ASA: 2  Anesthesia Plan: General   Post-op Pain Management: Regional block*   Induction: Intravenous  PONV Risk Score and Plan: 3 and Ondansetron, Dexamethasone, Midazolam and Treatment may vary due to age or medical condition  Airway Management Planned: LMA and Oral ETT  Additional Equipment:   Intra-op Plan:   Post-operative Plan: Extubation in OR  Informed Consent: I have reviewed the patients History and Physical, chart, labs and discussed the procedure including the risks, benefits and alternatives for the proposed anesthesia with the patient or authorized  representative who has indicated his/her understanding and acceptance.     Dental Advisory Given  Plan Discussed with: Anesthesiologist, CRNA and Surgeon  Anesthesia Plan Comments:        Anesthesia Quick Evaluation

## 2023-05-04 NOTE — Anesthesia Procedure Notes (Signed)
Procedure Name: LMA Insertion Date/Time: 05/04/2023 11:53 AM  Performed by: Karoline Caldwell, CRNAPre-anesthesia Checklist: Patient identified, Patient being monitored, Timeout performed, Emergency Drugs available and Suction available Patient Re-evaluated:Patient Re-evaluated prior to induction Oxygen Delivery Method: Circle system utilized Preoxygenation: Pre-oxygenation with 100% oxygen Induction Type: IV induction Ventilation: Mask ventilation without difficulty LMA: LMA inserted LMA Size: 4.0 Tube type: Oral Number of attempts: 1 Placement Confirmation: positive ETCO2 and breath sounds checked- equal and bilateral Tube secured with: Tape Dental Injury: Teeth and Oropharynx as per pre-operative assessment

## 2023-05-04 NOTE — H&P (Addendum)
Olivia Frost is an 32 y.o. female.   Chief Complaint: Patient presents with trimalleolar ankle fracture HPI: Patient presents with complaint left ankle fracture.  Pain scale is 7 out of 10 dull aching nature she has been nonweightbearing to the left lower extremity.  She is here to undergo surgical intervention.  Overall patient is healthy with no acute cardiac or respiratory issues.  Past Medical History:  Diagnosis Date   Anemia during pregnancy in third trimester 10/09/2022   COVID 03/26/2022   Migraines    Obesity in pregnancy 09/03/2022   Supervision of other normal pregnancy, antepartum 04/22/2022              Clinical Staff    Provider      Office Location     [facility]    Dating     11/21/2022, by Patient Reported         Language     English    Anatomy US     Normal       Flu Vaccine     offer    Genetic Screen     NIPS: Negative      TDaP vaccine      09/03/22    Hgb A1C or   GTT    Early :  Third trimester : 95      Covid    Has booster         LAB RESULTS       Rhogam     B/Positiv   SVD (spontaneous vaginal delivery) 10/07/2022   Trimalleolar fracture 03/2023   left    Past Surgical History:  Procedure Laterality Date   WRIST SURGERY Left     Family History  Problem Relation Age of Onset   Anxiety disorder Sister    Depression Sister    Breast cancer Maternal Grandmother 79   Sickle cell anemia Paternal Grandmother    Breast cancer Paternal Great-grandmother    Lung cancer Paternal Great-grandmother    Throat cancer Paternal Great-grandmother    Social History:  reports that she has never smoked. She has never used smokeless tobacco. She reports current alcohol use. She reports that she does not use drugs.  Allergies:  Allergies  Allergen Reactions   Latex Rash    No medications prior to admission.    No results found for this or any previous visit (from the past 48 hour(s)). No results found.  Review of Systems  Musculoskeletal:        Left ankle  pain with left ankle fracture minimal swelling  All other systems reviewed and are negative.   Last menstrual period 04/20/2023, currently breastfeeding. Physical Exam Vitals and nursing note reviewed. Exam conducted with a chaperone present.  Constitutional:      Appearance: Normal appearance.  HENT:     Head: Normocephalic.     Nose: Nose normal.     Mouth/Throat:     Mouth: Mucous membranes are moist.     Pharynx: Oropharynx is clear.  Eyes:     Pupils: Pupils are equal, round, and reactive to light.  Cardiovascular:     Rate and Rhythm: Normal rate and regular rhythm.     Pulses: Normal pulses.  Pulmonary:     Effort: Pulmonary effort is normal.  Abdominal:     General: Abdomen is flat. Bowel sounds are normal.  Musculoskeletal:        General: Swelling and signs of injury present.  Cervical back: Normal range of motion.     Comments: Left ankle fracture without any skin blisters  Neurological:     General: No focal deficit present.     Mental Status: She is alert and oriented to person, place, and time.  Psychiatric:        Mood and Affect: Mood normal.        Behavior: Behavior normal.      Assessment/Plan A 32 y.o. female presents with left trimalleolar ankle fracture -All questions and concerns were discussed with the patient in extensive detail -Patient will benefit from left open reduction internal fixation of the left ankle. -Patient does not have any cardiac or respiratory distress at this time. -Her medical history was reviewed. -Patient is stable and cleared for surgical intervention -Informed surgical risk consent was reviewed and read aloud to the patient.  I reviewed the films.  I have discussed my findings with the patient in great detail.  I have discussed all risks including but not limited to infection, stiffness, scarring, limp, disability, deformity, damage to blood vessels and nerves, numbness, poor healing, need for braces, arthritis, chronic  pain, amputation, death.  All benefits and realistic expectations discussed in great detail.  I have made no promises as to the outcome.  I have provided realistic expectations.  I have offered the patient a 2nd opinion, which they have declined and assured me they preferred to proceed despite the risks  Candelaria Stagers, DPM 05/04/2023, 8:19 AM

## 2023-05-04 NOTE — Transfer of Care (Signed)
Immediate Anesthesia Transfer of Care Note  Patient: Olivia Frost  Procedure(s) Performed: OPEN REDUCTION INTERNAL FIXATION (ORIF) ANKLE FRACTURE (Left: Ankle)  Patient Location: PACU  Anesthesia Type:General  Level of Consciousness: awake, alert , and drowsy  Airway & Oxygen Therapy: Patient Spontanous Breathing and Patient connected to face mask oxygen  Post-op Assessment: Report given to RN and Post -op Vital signs reviewed and stable  Post vital signs: Reviewed and stable  Last Vitals:  Vitals Value Taken Time  BP 129/69 05/04/23 1357  Temp 36.1 C 05/04/23 1357  Pulse 93 05/04/23 1400  Resp 18 05/04/23 1400  SpO2 100 % 05/04/23 1400  Vitals shown include unfiled device data.  Last Pain:  Vitals:   05/04/23 1108  PainSc: 0-No pain         Complications: No notable events documented.

## 2023-05-05 ENCOUNTER — Encounter: Payer: Self-pay | Admitting: Podiatry

## 2023-05-06 ENCOUNTER — Encounter: Payer: Self-pay | Admitting: Podiatry

## 2023-05-06 ENCOUNTER — Inpatient Hospital Stay: Admission: RE | Admit: 2023-05-06 | Payer: Medicaid Other | Source: Ambulatory Visit

## 2023-05-07 NOTE — Anesthesia Postprocedure Evaluation (Signed)
Anesthesia Post Note  Patient: Olivia Frost  Procedure(s) Performed: OPEN REDUCTION INTERNAL FIXATION (ORIF) ANKLE FRACTURE (Left: Ankle)  Patient location during evaluation: PACU Anesthesia Type: General Level of consciousness: awake and alert Pain management: pain level controlled Vital Signs Assessment: post-procedure vital signs reviewed and stable Respiratory status: spontaneous breathing, nonlabored ventilation, respiratory function stable and patient connected to nasal cannula oxygen Cardiovascular status: blood pressure returned to baseline and stable Postop Assessment: no apparent nausea or vomiting Anesthetic complications: no   No notable events documented.   Last Vitals:  Vitals:   05/04/23 1500 05/04/23 1530  BP: (!) 137/92 126/87  Pulse: 72 68  Resp: 14   Temp: (!) 36.1 C 36.6 C  SpO2: 100% 100%    Last Pain:  Vitals:   05/05/23 0857  PainSc: 0-No pain                 Lenard Simmer

## 2023-05-11 ENCOUNTER — Ambulatory Visit (INDEPENDENT_AMBULATORY_CARE_PROVIDER_SITE_OTHER): Payer: Medicaid Other | Admitting: Podiatry

## 2023-05-11 ENCOUNTER — Encounter: Payer: Self-pay | Admitting: Podiatry

## 2023-05-11 VITALS — Ht 68.0 in | Wt 228.0 lb

## 2023-05-11 DIAGNOSIS — S82892A Other fracture of left lower leg, initial encounter for closed fracture: Secondary | ICD-10-CM

## 2023-05-11 DIAGNOSIS — Z9889 Other specified postprocedural states: Secondary | ICD-10-CM

## 2023-05-11 NOTE — Progress Notes (Signed)
Subjective:  Patient ID: Olivia Frost, female    DOB: January 02, 1991,  MRN: [id number]  Chief Complaint  Patient presents with   Routine Post Op    F/u due to lent ankle surgery     DOS: 05/04/2023 Procedure: Left open reduction internal fixation of ankle fracture with syndesmotic stabilization  32 y.o. female returns for post-op check.  Patient states that she is doing well.  Minimal pain.  Pain is controlled.  Nonweightbearing to the left lower extremity with crutches  Review of Systems: Negative except as noted in the HPI. Denies N/V/F/Ch.  Past Medical History:  Diagnosis Date   Anemia during pregnancy in third trimester 10/09/2022   COVID 03/26/2022   Migraines    Obesity in pregnancy 09/03/2022   Supervision of other normal pregnancy, antepartum 04/22/2022              Clinical Staff    Provider      Office Location     [facility]    Dating     11/21/2022, by Patient Reported         Language     English    Anatomy US     Normal       Flu Vaccine     offer    Genetic Screen     NIPS: Negative      TDaP vaccine      09/03/22    Hgb A1C or   GTT    Early :  Third trimester : 33      Covid    Has booster         LAB RESULTS       Rhogam     B/Positiv   SVD (spontaneous vaginal delivery) 10/07/2022   Trimalleolar fracture 03/2023   left    Current Outpatient Medications:    acetaminophen (TYLENOL) 500 MG tablet, Take 1,000 mg by mouth every 8 (eight) hours as needed (pain.)., Disp: , Rfl:    ibuprofen (ADVIL) 800 MG tablet, Take 800 mg by mouth every 8 (eight) hours as needed (pain.)., Disp: , Rfl:    ibuprofen (ADVIL) 800 MG tablet, Take 1 tablet (800 mg total) by mouth every 6 (six) hours as needed., Disp: 60 tablet, Rfl: 1   oxyCODONE (ROXICODONE) 5 MG immediate release tablet, Take 1 tablet (5 mg total) by mouth every 8 (eight) hours as needed., Disp: 10 tablet, Rfl: 0   oxyCODONE-acetaminophen (PERCOCET) 5-325 MG tablet, Take 1 tablet by mouth every 4 (four) hours as  needed for severe pain (pain score 7-10)., Disp: 30 tablet, Rfl: 0  Social History   Tobacco Use  Smoking Status Never  Smokeless Tobacco Never    Allergies  Allergen Reactions   Latex Rash   Objective:  There were no vitals filed for this visit. Body mass index is 34.66 kg/m. Constitutional Well developed. Well nourished.  Vascular Foot warm and well perfused. Capillary refill normal to all digits.   Neurologic Normal speech. Oriented to person, place, and time. Epicritic sensation to light touch grossly present bilaterally.  Dermatologic Skin healing well without signs of infection. Skin edges well coapted without signs of infection.  Orthopedic: Tenderness to palpation noted about the surgical site.   Radiographs: 3 views of skeletally mature adult left foot: Hardware is intact no signs of backing out or loosening noted.  Ankle mortise within normal limits.  No gapping noted. Assessment:   1. Closed fracture of left ankle, initial encounter  2. Status post foot surgery    Plan:  Patient was evaluated and treated and all questions answered.  S/p foot surgery left -Progressing as expected post-operatively. -XR: See above -WB Status: Nonweightbearing in left lower extremity in crutches -Sutures: Intact.  No clinical signs of Deis is noted no complication noted. -Medications: None -Foot redressed.  No follow-ups on file.

## 2023-05-11 NOTE — Op Note (Signed)
Surgeon: Surgeon(s): Candelaria Stagers, DPM  Assistants: None Pre-operative diagnosis: ANKLE FRACTURE LEFT  Post-operative diagnosis: same Procedure: Procedure(s) (LRB): OPEN REDUCTION INTERNAL FIXATION (ORIF) ANKLE FRACTURE (Left)  Pathology: * No specimens in log *  Pertinent Intra-op findings: Fibular fracture noted.  Positive syndesmotic disruption noted. Anesthesia: Choice  Hemostasis: * Missing tourniquet times found for documented tourniquets in log: [id number] * EBL: 5 mL  Materials: 3-0 Monocryl 3-0 Prolene skin staple with Acumed plates and screws with cerclage wiring 20-gauge Injectables: None Complications: None  Indications for surgery: A 32 y.o. female presents with left trimalleolar ankle fracture. Patient has failed all conservative therapy including but not limited to number bearing. She wishes to have surgical correction of the foot/deformity. It was determined that patient would benefit from left open reduction internal fixation of trimalleolar ankle fracture with syndesmotic fixation/reduction. Informed surgical risk consent was reviewed and read aloud to the patient.  I reviewed the films.  I have discussed my findings with the patient in great detail.  I have discussed all risks including but not limited to infection, stiffness, scarring, limp, disability, deformity, damage to blood vessels and nerves, numbness, poor healing, need for braces, arthritis, chronic pain, amputation, death.  All benefits and realistic expectations discussed in great detail.  I have made no promises as to the outcome.  I have provided realistic expectations.  I have offered the patient a 2nd opinion, which they have declined and assured me they preferred to proceed despite the risks   Procedure in detail: The patient was both verbally and visually identified by myself, the nursing staff, and anesthesia staff in the preoperative holding area. They were then transferred to the operating room and placed  on the operative table in supine position.  Prior to the procedure, the risks, benefits, and alternatives were discussed, and the patient agreed to proceed.  Following anesthesia, the patient was placed in the appropriate position.  Prepping and draping was performed.  A time-out was done.   The operative lower extremity was outfitted with a pneumatic tourniquet at the level of the upper thigh. Next, the operative extremity was elevated and exsanguinated, the tourniquet was then elevated to 300 mmHg.  Flouroscopy images were taken and the fracture was confirmed.   At this point, attention was turned to the lateral aspect of the ankle. After marking the intended incision with a marking pen, a straight lateral incision was made directly over the distal fibula. The dissection was taken through the skin and subcutaneous layers directly to the level of the bone. A sub-periosteal dissection of the mid fibula fracture was performed and the fracture site identified. The fracture site was debrided of interposed soft tissue and hematoma. The fracture was then reduced using a reduction clamp. Initial fixation was then performed with an posterior to an.  Lag screw at the fracture site. The fracture was then further stabilized with a laterally based  semi-tubular plate. This was applied with bi-cortical screws to compress the plate to bone and then completed with more bicortical screws and cancellous screws distally. At this point, fluoroscopy showed the lateral malleolar fracture to be reduced and appropriately fixated. All hardware is in good position.  Next, the flouroscope was turned for a lateral view. Fixation was appropriate.  A cotton test and external rotation stress test was performed and the syndesmosis was ruptured as there was increasing medial clear space  The syndesmosis was stressed with external rotation noted to be unstable, and therefore reduction of syndesmosis  was performed with Weber clamp and  stabilized and direct visualization and palpation of the tibial incisura noted appropriate reduction with the ankle in a neutral position.  Once this was complete, a Acumed TightRope device was inserted from lateral to medial under live fluoroscopy and stabilized the syndesmotic repair and reduction.  Once this was complete, stressed exam was performed and the syndesmosis was noted to be stable.    At this point, fluoroscopy again confirmed appropriate fracture reduction and hardware placement. The wound was then irrigated and closed in layers using using 1 roll and skin staples for the skin closure.  Sterile dressings were then applied followed by a well padded cam boot was applied.  Nonweightbearing  The patient tolerated the procedure well.  At the conclusion of the procedure the patient was awoken from anesthesia and found to have tolerated the procedure well any complications. There were transferred to PACU with vital signs stable and vascular status intact.  Nicholes Rough, DPM

## 2023-05-17 ENCOUNTER — Telehealth: Payer: Self-pay | Admitting: Podiatry

## 2023-05-17 NOTE — Telephone Encounter (Signed)
Patient called asking to speak to Dr Allena Katz or a nurse. Patient states she is having a lot of pain and she wasn't sure if there is something she can do?

## 2023-05-18 ENCOUNTER — Other Ambulatory Visit: Payer: Self-pay

## 2023-05-18 MED ORDER — CYCLOBENZAPRINE HCL 10 MG PO TABS
10.0000 mg | ORAL_TABLET | Freq: Three times a day (TID) | ORAL | 0 refills | Status: DC | PRN
Start: 2023-05-18 — End: 2024-03-17

## 2023-05-25 ENCOUNTER — Encounter: Payer: Self-pay | Admitting: Podiatry

## 2023-05-25 ENCOUNTER — Encounter: Payer: Self-pay | Admitting: Obstetrics and Gynecology

## 2023-05-25 ENCOUNTER — Ambulatory Visit (INDEPENDENT_AMBULATORY_CARE_PROVIDER_SITE_OTHER): Payer: Medicaid Other | Admitting: Podiatry

## 2023-05-25 DIAGNOSIS — S82892A Other fracture of left lower leg, initial encounter for closed fracture: Secondary | ICD-10-CM

## 2023-05-25 DIAGNOSIS — Z9889 Other specified postprocedural states: Secondary | ICD-10-CM

## 2023-05-25 NOTE — Progress Notes (Signed)
Subjective:  Patient ID: Olivia Frost, female    DOB: March 23, 1991,  MRN: [id number]  Chief Complaint  Patient presents with   Routine Post Op    POV #2 DOS 05/04/2023 LT ORIF OF ANKLE W/SYNDESMOSIS "It's okay.  Sometime it feels like pins and needles are sticking in it.  It burns."    DOS: 05/04/2023 Procedure: Left open reduction internal fixation of ankle fracture with syndesmotic stabilization  32 y.o. female returns for post-op check.  Patient states that she is doing well.  Minimal pain.  Pain is controlled.  Nonweightbearing to the left lower extremity with crutches  Review of Systems: Negative except as noted in the HPI. Denies N/V/F/Ch.  Past Medical History:  Diagnosis Date   Anemia during pregnancy in third trimester 10/09/2022   COVID 03/26/2022   Migraines    Obesity in pregnancy 09/03/2022   Supervision of other normal pregnancy, antepartum 04/22/2022              Clinical Staff    Provider      Office Location     [facility]    Dating     11/21/2022, by Patient Reported         Language     English    Anatomy US     Normal       Flu Vaccine     offer    Genetic Screen     NIPS: Negative      TDaP vaccine      09/03/22    Hgb A1C or   GTT    Early :  Third trimester : 15      Covid    Has booster         LAB RESULTS       Rhogam     B/Positiv   SVD (spontaneous vaginal delivery) 10/07/2022   Trimalleolar fracture 03/2023   left    Current Outpatient Medications:    acetaminophen (TYLENOL) 500 MG tablet, Take 1,000 mg by mouth every 8 (eight) hours as needed (pain.)., Disp: , Rfl:    cyclobenzaprine (FLEXERIL) 10 MG tablet, Take 1 tablet (10 mg total) by mouth 3 (three) times daily as needed for muscle spasms., Disp: 30 tablet, Rfl: 0   ibuprofen (ADVIL) 800 MG tablet, Take 800 mg by mouth every 8 (eight) hours as needed (pain.)., Disp: , Rfl:    ibuprofen (ADVIL) 800 MG tablet, Take 1 tablet (800 mg total) by mouth every 6 (six) hours as needed., Disp: 60 tablet,  Rfl: 1   oxyCODONE (ROXICODONE) 5 MG immediate release tablet, Take 1 tablet (5 mg total) by mouth every 8 (eight) hours as needed., Disp: 10 tablet, Rfl: 0   oxyCODONE-acetaminophen (PERCOCET) 5-325 MG tablet, Take 1 tablet by mouth every 4 (four) hours as needed for severe pain (pain score 7-10)., Disp: 30 tablet, Rfl: 0  Social History   Tobacco Use  Smoking Status Never  Smokeless Tobacco Never    Allergies  Allergen Reactions   Latex Rash   Objective:  There were no vitals filed for this visit. There is no height or weight on file to calculate BMI. Constitutional Well developed. Well nourished.  Vascular Foot warm and well perfused. Capillary refill normal to all digits.   Neurologic Normal speech. Oriented to person, place, and time. Epicritic sensation to light touch grossly present bilaterally.  Dermatologic Skin completely epithelialized.  No signs of dehiscence noted no complication noted.  Good range of  motion noted of the ankle joint.  Orthopedic: Tenderness to palpation noted about the surgical site.   Radiographs: 3 views of skeletally mature adult left foot: Hardware is intact no signs of backing out or loosening noted.  Ankle mortise within normal limits.  No gapping noted. Assessment:   1. Closed fracture of left ankle, initial encounter   2. Status post foot surgery    Plan:  Patient was evaluated and treated and all questions answered.  S/p foot surgery left -Progressing as expected post-operatively. -XR: See above -WB Status: Nonweightbearing in left lower extremity in crutches -Sutures: Removed no clinical signs of Deis is noted no complication noted. -Medications: None -Foot redressed. -Begin physical therapy and transition to weightbearing as tolerated in 2 weeks with a cam boot  No follow-ups on file.

## 2023-05-25 NOTE — Telephone Encounter (Signed)
TRIAGE VOICEMAIL: Patient inquiring if she is able to take mucinex or a decongestant while breastfeeding.

## 2023-06-02 ENCOUNTER — Ambulatory Visit: Payer: Medicaid Other | Attending: Podiatry | Admitting: Physical Therapy

## 2023-06-02 ENCOUNTER — Encounter: Payer: Self-pay | Admitting: Physical Therapy

## 2023-06-02 DIAGNOSIS — R262 Difficulty in walking, not elsewhere classified: Secondary | ICD-10-CM | POA: Insufficient documentation

## 2023-06-02 DIAGNOSIS — M25672 Stiffness of left ankle, not elsewhere classified: Secondary | ICD-10-CM | POA: Diagnosis present

## 2023-06-02 DIAGNOSIS — S82892A Other fracture of left lower leg, initial encounter for closed fracture: Secondary | ICD-10-CM | POA: Insufficient documentation

## 2023-06-02 DIAGNOSIS — M25572 Pain in left ankle and joints of left foot: Secondary | ICD-10-CM | POA: Insufficient documentation

## 2023-06-02 DIAGNOSIS — M6281 Muscle weakness (generalized): Secondary | ICD-10-CM | POA: Insufficient documentation

## 2023-06-02 NOTE — Therapy (Signed)
OUTPATIENT PHYSICAL THERAPY EVALUATION   Patient Name: Olivia Frost MRN: [id number] DOB:[birth year], 32 y.o., female Today's Date: 06/02/2023  END OF SESSION:  PT End of Session - 06/02/23 1846     Visit Number 1    Number of Visits 17    Date for PT Re-Evaluation 08/25/23    Authorization Type [place] [employer]ICA[employer] reporting period from 06/02/2023    Authorization Time Period needs auth    Authorization - Visit Number 1    Authorization - Number of Visits 1    Progress Note Due on Visit 10    PT Start Time 1518    PT Stop Time 1600    PT Time Calculation (min) 42 min    Activity Tolerance Patient tolerated treatment well    Behavior During Therapy [facility] for tasks assessed/performed             Past Medical History:  Diagnosis Date   Anemia during pregnancy in third trimester 10/09/2022   COVID 03/26/2022   Migraines    Obesity in pregnancy 09/03/2022   Supervision of other normal pregnancy, antepartum 04/22/2022              Clinical Staff    Provider      Office Location     [facility]    Dating     11/21/2022, by Patient Reported         Language     English    Anatomy US     Normal       Flu Vaccine     offer    Genetic Screen     NIPS: Negative      TDaP vaccine      09/03/22    Hgb A1C or   GTT    Early :  Third trimester : 69      Covid    Has booster         LAB RESULTS       Rhogam     B/Positiv   SVD (spontaneous vaginal delivery) 10/07/2022   Trimalleolar fracture 03/2023   left   Past Surgical History:  Procedure Laterality Date   ORIF ANKLE FRACTURE Left 05/04/2023   Procedure: OPEN REDUCTION INTERNAL FIXATION (ORIF) ANKLE FRACTURE;  Surgeon: Candelaria Stagers, DPM;  Location: [facility] ORS;  Service: Orthopedics/Podiatry;  Laterality: Left;  POPLITEAL/SAPHENOUS BLOCK   WRIST SURGERY Left    Patient Active Problem List   Diagnosis Date Noted   Intrauterine device 01/11/2023    PCP: no PCP  REFERRING PROVIDER: Candelaria Stagers, DPM  REFERRING DIAG: closed  fracture of left ankle  THERAPY DIAG:  Pain in left ankle and joints of left foot  Stiffness of left ankle, not elsewhere classified  Muscle weakness (generalized)  Difficulty in walking, not elsewhere classified  Rationale for Evaluation and Treatment: Rehabilitation  ONSET DATE: fracture on 04/22/2023 and surgery 05/04/2023  SUBJECTIVE:   SUBJECTIVE STATEMENT: Patient states she took a tumble when she missed a step at her apartment complex. She fell down one step while carrying her infant. She twisted and turned to protect her baby and fractured and dislocated her left ankle. She went to ED and it was reset the day she broke it. When her swelling went down he had surgery on May 04, 2023. She states everything went well with the surgery. She had no ankle problems prior to breaking her ankle.  Was in pelvic PT from July until she broke her  ankle. She has been staying with her parents for the last 6 weeks because it is easier to get around there. She currently needs support with everything including driving. Her insurance covers one issue at a time for PT, but she would like to return to pelvic PT when able.  Patient takes her CAM boot off when sitting around and when sleeping.   Plan from [facility] visit on  05/25/2023 includes the following: -WB Status: Nonweightbearing in left lower extremity in crutches -Begin physical therapy and transition to weightbearing as tolerated in 2 weeks with a cam boot  Dr. Allena Katz replied: "nothing specific, just strengthening the leg and getting her to ambulate to being WBAT in boot" when asked if he had a specific protocol (by instant message on 06/02/2023).   PERTINENT HISTORY: Patient is a 32 y.o. female who presents to outpatient physical therapy with a referral for medical diagnosis closed fracture of left ankle. This patient's chief complaints consist of left ankle pain, stiffness, and dysfunction s/p L ORIF of left ankle fracture with syndesmotic  stabilization on 05/04/2023,  leading to the following functional deficits: difficulty with usual activities including basic household and community ambulation, transfers, self-care, stairs, ADL and IADLs, caring for her children, housework, laundry, working, driving without difficulty. Relevant past medical history and comorbidities include has a past medical history of pelvic floor dysfunction, disabling pelvic pain post partum, Anemia during pregnancy in third trimester (10/09/2022), Migraines, and Trimalleolar fracture (03/2023). Patient denies hx of cancer, stroke, seizures, lung problems, heart problems, diabetes, unexplained weight loss, unexplained changes in bowel or bladder problems, unexplained stumbling or dropping things, osteoporosis, and spinal surgery  DOS: 05/04/2023 Procedure: Left open reduction internal fixation of ankle fracture with syndesmotic stabilization  PAIN: Are you having pain? Yes NPRS: Current: 3/10,  Best: 3/10, Worst: 10/10. Pain location: top of left foot Pain description: "the nerves in my foot are killing me" pins and needles, or anything brushes across the top of the foot, can also feel hot, feels tight and numb, "feels like bruise" at lateral ankle Aggravating factors: touching top of foot Relieving factors: nothing   FUNCTIONAL LIMITATIONS: Patient is currently dependent on everyone for everything. She is currently living with her parents. She has difficulty with usual activities including basic household and community ambulation, transfers, self-care, stairs, ADL and IADLs, caring for her children, housework, laundry, working, driving without difficulty  LEISURE: shopping, 30 year old and 44 month old girls, going to the park, working out  with boyfriend (boxing and jogging), go out with friends.   PRECAUTIONS: fall, weight bearing restrictions (see below), has not been cleared to do exercises due to pelvic floor.   WEIGHT BEARING RESTRICTIONS: Yes NWB L LE on  crutches until 06/08/2023 then wean to WBAT in cam boot   FALLS:  Has patient fallen in last 6 months? Yes. Number of falls 1  LIVING ENVIRONMENT: Lives with: boyfriend and children (38 year old, and 90 month year old) Lives in: apartment Stairs: 36 steps to enter, right handrail only (with 1 landing) Has following equipment at home: Crutches and knee scooter  OCCUPATION: stopped working after she had her baby, long term disability due to pelvic issues. 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   PLOF: Independent  PATIENT GOALS: "hoping it will be as close to normalcy as possible" "would love to get back into walking without boot and to slowly start to  integrate exercise"  NEXT MD VISIT: no appointment made yet but should be 4 weeks from 05/25/2023.   OBJECTIVE  DIAGNOSTIC FINDINGS:  Xray report from 05/04/2023:  CLINICAL DATA:  Postop.   EXAM: LEFT ANKLE COMPLETE - 3+ VIEW   COMPARISON:  Preoperative imaging   FINDINGS: Lateral plate and multi screw fixation of distal fibular fracture with inter fragmentary screw and cerclage wire. There is also a syndesmotic trapeze. Improved mortise alignment. Mildly displaced posterior malleolar fracture is unchanged. Recent postsurgical change includes air and edema in the soft tissues with lateral skin staples in place.   IMPRESSION: 1. ORIF of distal fibular fracture. Improved mortise alignment. 2. Unchanged mildly displaced posterior malleolar fracture.  SELF- REPORTED FUNCTION FOTO score: 38/100 (ankle questionnaire)  OBSERVATION/INSPECTION Posture Posture (standing): stands with weight bearing on R LE, left foot in boot resting on floor with knee flexed.  Anthropometrics Tremor: none Body composition: BMI: 40.7 Skin: Incision covered by bandage but no excessive redness, warmth, drainage or signs of infection present where visualized.  Edema: edema as expected at left  ankle/foot Functional Mobility Transfers: sit <> stand mod I with minimal weight bearing on L LE.  Gait: ambulates from/to parking lot and in clinic NWB L LE with bilateral axial crutches, she trialed ambulating with partial weight bearing with one and two axial crutches. At first kept knee flexed when attempting to bear weight with two crutches and with hop using R LE with R crutch only. Improved to increased knee extension with B crutches after practicing weight shifting first. Denies increased pain, just some pressure.    PERIPHERAL JOINT MOTION (in degrees) ACTIVE RANGE OF MOTION (AROM) *Indicates pain 06/02/23 Date Date  Joint/Motion R/L R/L R/L  Ankle/Foot     Dorsiflexion (knee ext) 8/-10 / /  Dorsiflexion (knee flex) / / /  Plantarflexion 55/40 / /  Everison 20/2 / /  Inversion 40/25 / /  Comments:   PASSIVE RANGE OF MOTION (PROM) *Indicates pain 06/02/23 Date Date  Joint/Motion R/L R/L R/L  Ankle/Foot     Dorsiflexion (knee ext) / / /  Dorsiflexion (knee flex) / / /  Plantarflexion / / /  Great toe extension 90/95 / /  Great toe flexion / / /  Comments:   MUSCLE PERFORMANCE (MMT):  Comments: deferred due to recent surgery.     TODAY'S TREATMENT:    Therapeutic exercise: to centralize symptoms and improve ROM, strength, muscular endurance, and activity tolerance required for successful completion of functional activities.  - education on gradual progression to WBAT with CAM boot, lateral weight shifting with B UE support on counter (1x6 each direction), ambulation with B crutches and R crutch only a few feet each, adjustment of crutches to more appropriate height.  - seated left ankle alphabet AROM approx 1x15 letters - seated L > R foot Toe splays, repeated 4 sec holds 1x5. Ball of foot and heel maintains contact with floor. To improve intrinsic foot muscle activation and strength in order to better support arch and intrinsic foot structures.  - seated L > R foot Toe  Yoga: great toe extension with small toes flexion pressure into floor, many attempts at repeated 4 sec holds; small toes extension with great toe flexion pressure into floor, many attempts at repeated 4 sec holds. Ball of foot and heel maintains contact with floor. To improve intrinsic foot muscle activation and strength in order to better support arch and intrinsic foot structures.  - seated AAROM left ankle dorsiflexion  foot slide, 1x3 with 5 second holds.  - seated AAROM left great toe extension with gentle AROM plantar flexion, 1x3 with 5 second holds.  - Education on HEP including handout   Pt required multimodal cuing for proper technique and to facilitate improved neuromuscular control, strength, range of motion, and functional ability resulting in improved performance and form.   PATIENT EDUCATION:  Education details: Exercise purpose/form. Self management techniques. Education on diagnosis, prognosis, POC, anatomy and physiology of current condition. Education on HEP including handout. Person educated: Patient Education method: Explanation, Demonstration, Tactile cues, Verbal cues, and Handouts Education comprehension: verbalized understanding, returned demonstration, verbal cues required, tactile cues required, and needs further education  HOME EXERCISE PROGRAM: Access Code: [email or web address] URL: [email or web address] Date: 06/02/2023 Prepared by: Norton Blizzard  Exercises - Standing Weight Shift  - 3 x daily - 2-3 sets - 10 reps - Seated Ankle Alphabet  - 3 x daily - 2-3 sets - Seated Heel Slide  - 3 x daily - 2 sets - 10 reps - 5 seconds hold - Toe Spreading  - 1-2 x daily - 1 sets - 20 reps - 4 seconds hold - Toe Yoga - Alternating Great Toe and Lesser Toe Extension  - 1-2 x daily - 1 sets - 20 reps - 4 seconds hold  ASSESSMENT:  CLINICAL IMPRESSION: Patient is a 32 y.o. female referred to outpatient physical therapy with a medical diagnosis of closed fracture of left  ankle who presents with signs and symptoms consistent with left ankle pain, stiffness, weakness, and dysfunction s/p left open reduction internal fixation of ankle fracture with syndesmotic stabilization on 05/04/2023 2/2 left ankle fracture from fall on 04/22/2023. Patient presents with significant pain, ROM, joint stiffness, gait, tissue integrity, muscle performance (strength/power/endurance), and activity tolerance impairments that are limiting ability to complete her usual activities including basic household and community ambulation, transfers, self-care, stairs, ADL and IADLs, caring for her children, housework, laundry, working, driving without difficulty. Patient will benefit from skilled physical therapy intervention to address current body structure impairments and activity limitations to improve function and work towards goals set in current POC in order to return to prior level of function or maximal functional improvement.   OBJECTIVE IMPAIRMENTS: Abnormal gait, decreased activity tolerance, decreased balance, decreased coordination, decreased endurance, decreased knowledge of condition, decreased knowledge of use of DME, decreased mobility, difficulty walking, decreased ROM, decreased strength, hypomobility, increased edema, increased fascial restrictions, impaired perceived functional ability, impaired flexibility, impaired sensation, improper body mechanics, obesity, and pain.   ACTIVITY LIMITATIONS: carrying, lifting, bending, sitting, standing, squatting, stairs, transfers, bed mobility, bathing, dressing, reach over head, hygiene/grooming, locomotion level, and caring for others  PARTICIPATION LIMITATIONS: meal prep, cleaning, laundry, interpersonal relationship, driving, shopping, community activity, occupation, and yard work  PERSONAL FACTORS: Past/current experiences, Transportation, and 1-2 comorbidities: migraines, pelvic floor dysfunction/pain  are also affecting patient's functional  outcome.   REHAB POTENTIAL: Good  CLINICAL DECISION MAKING: Stable/uncomplicated  EVALUATION COMPLEXITY: Low   GOALS: Goals reviewed with patient? No  SHORT TERM GOALS: Target date: 06/16/2023  Patient will be independent with initial home exercise program for self-management of symptoms. Baseline: Initial HEP provided at IE (06/02/23); Goal status: INITIAL   LONG TERM GOALS: Target date: 08/25/2023  Patient will be independent with a long-term home exercise program for self-management of symptoms.  Baseline: Initial HEP provided at IE (06/02/23); Goal status: INITIAL  2.  Patient will demonstrate improved FOTO to equal or greater than 68 by visit #15 to  demonstrate improvement in overall condition and self-reported functional ability.  Baseline: 38 (06/02/23); Goal status: INITIAL  3.  Patient will demonstrate the ability to ambulate equal or greater than 1300 feet on 6 Minute Walk Test with no gait deviations to improve her ability to complete community mobility.  Baseline: not tested - pt NWB on crutches at initial eval (06/02/23); Goal status: INITIAL  4.  Patient will demonstrate L ankle PROM within 90% of right ankle PROM to improve her ability to ambulate normally and complete stairs.  Baseline: Initial AROM measured at left ankle significantly less than right  (06/02/23); Goal status: INITIAL  5.  Patient will demonstrate improvement in Patient Specific Functional Scale (PSFS) of equal or greater than 3 points to reflect clinically significant improvement in patient's most valued functional activities. Baseline: to be measured visit #2 as appropriate (06/02/23); Goal status: INITIAL  6.  Patient will demonstrate the ability to perform 20 single leg heel raises on L LE to help normalize gait pattern and improve her ability to return to her exercise routine, complete stairs, and care for her children safely and effectively.  Baseline: unable to weight bear on L LE  (06/02/23); Goal status: INITIAL    PLAN:  PT FREQUENCY: 1-2x/week  PT DURATION: 12 weeks  PLANNED INTERVENTIONS: 97164- PT Re-evaluation, 97110-Therapeutic exercises, 97530- Therapeutic activity, 97112- Neuromuscular re-education, 97535- Self Care, 16109- Manual therapy, [phone]- Gait training, 97014- Electrical stimulation (unattended), Patient/Family education, Balance training, Stair training, Dry Needling, Joint mobilization, Spinal mobilization, Cryotherapy, and Moist heat  PLAN FOR NEXT SESSION: update HEP as appropriate, progressive LE/functional strengthening/ROM/balance as appropriate. Normalize weight bearing and gait mechanics within physician's and tissue healing limitations. Education.    Luretha Murphy. Ilsa Iha, PT, DPT 06/02/23, 7:23 PM  [facility] [address] [place], [place] [address] P: [phone] I F: [phone]

## 2023-06-03 NOTE — Therapy (Unsigned)
OUTPATIENT PHYSICAL THERAPY TREATMENT   Patient Name: Olivia Frost MRN: [id number] DOB:1991/06/29, 32 y.o., female Today's Date: 06/03/2023  END OF SESSION:    Past Medical History:  Diagnosis Date   Anemia during pregnancy in third trimester 10/09/2022   COVID 03/26/2022   Migraines    Obesity in pregnancy 09/03/2022   Supervision of other normal pregnancy, antepartum 04/22/2022              Clinical Staff    Provider      Office Location     [facility]    Dating     11/21/2022, by Patient Reported         Language     English    Anatomy US     Normal       Flu Vaccine     offer    Genetic Screen     NIPS: Negative      TDaP vaccine      09/03/22    Hgb A1C or   GTT    Early :  Third trimester : 53      Covid    Has booster         LAB RESULTS       Rhogam     B/Positiv   SVD (spontaneous vaginal delivery) 10/07/2022   Trimalleolar fracture 03/2023   left   Past Surgical History:  Procedure Laterality Date   ORIF ANKLE FRACTURE Left 05/04/2023   Procedure: OPEN REDUCTION INTERNAL FIXATION (ORIF) ANKLE FRACTURE;  Surgeon: Candelaria Stagers, DPM;  Location: [facility];  Service: Orthopedics/Podiatry;  Laterality: Left;  POPLITEAL/SAPHENOUS BLOCK   WRIST SURGERY Left    Patient Active Problem List   Diagnosis Date Noted   Intrauterine device 01/11/2023    PCP: no PCP  REFERRING PROVIDER: Candelaria Stagers, DPM  REFERRING DIAG: closed fracture of left ankle  THERAPY DIAG:  No diagnosis found.  Rationale for Evaluation and Treatment: Rehabilitation  ONSET DATE: fracture on 04/22/2023 and surgery 05/04/2023  SUBJECTIVE:   PERTINENT HISTORY: Patient is a 32 y.o. female who presents to outpatient physical therapy with a referral for medical diagnosis closed fracture of left ankle. This patient's chief complaints consist of left ankle pain, stiffness, and dysfunction s/p L ORIF of left ankle fracture with syndesmotic stabilization on 05/04/2023,  leading to the following  functional deficits: difficulty with usual activities including basic household and community ambulation, transfers, self-care, stairs, ADL and IADLs, caring for her children, housework, laundry, working, driving without difficulty. Relevant past medical history and comorbidities include has a past medical history of pelvic floor dysfunction, disabling pelvic pain post partum, Anemia during pregnancy in third trimester (10/09/2022), Migraines, and Trimalleolar fracture (03/2023). Patient denies hx of cancer, stroke, seizures, lung problems, heart problems, diabetes, unexplained weight loss, unexplained changes in bowel or bladder problems, unexplained stumbling or dropping things, osteoporosis, and spinal surgery  SUBJECTIVE STATEMENT: ***  PAIN: Are you having pain? Yes NPRS: ***   PRECAUTIONS: fall, weight bearing restrictions (see below), has not been cleared to do exercise due to pelvic floor.   Dr. Allena Katz replied: "nothing specific, just strengthening the leg and getting her to ambulate to being WBAT in boot" when asked if he had a specific protocol (by instant message on 06/02/2023).   WEIGHT BEARING RESTRICTIONS: Yes NWB L LE on crutches until 06/08/2023 then wean to WBAT in cam boot    PATIENT GOALS: "hoping it will be as close to normalcy as  possible" "would love to get back into walking without boot and to slowly start to integrate exercise"  NEXT MD VISIT: no appointment made yet but should be 4 weeks from 05/25/2023.   OBJECTIVE  There were no vitals filed for this visit.  SELF-REPORTED FUNCTION Patient Specific Functional Scale (PSFS)  ***: *** ***: *** ***: *** Average: ***   TODAY'S TREATMENT:    Therapeutic exercise: to centralize symptoms and improve ROM, strength, muscular endurance, and activity tolerance required for successful completion of functional activities.  - education on gradual progression to WBAT with CAM boot, lateral weight shifting with B UE support  on counter (1x6 each direction), ambulation with B crutches and R crutch only a few feet each, adjustment of crutches to more appropriate height.  - seated left ankle alphabet AROM approx 1x15 letters - seated L > R foot Toe splays, repeated 4 sec holds 1x5. Ball of foot and heel maintains contact with floor. To improve intrinsic foot muscle activation and strength in order to better support arch and intrinsic foot structures.  - seated L > R foot Toe Yoga: great toe extension with small toes flexion pressure into floor, many attempts at repeated 4 sec holds; small toes extension with great toe flexion pressure into floor, many attempts at repeated 4 sec holds. Ball of foot and heel maintains contact with floor. To improve intrinsic foot muscle activation and strength in order to better support arch and intrinsic foot structures.  - seated AAROM left ankle dorsiflexion foot slide, 1x3 with 5 second holds.  - seated AAROM left great toe extension with gentle AROM plantar flexion, 1x3 with 5 second holds.  - Education on HEP including handout   Pt required multimodal cuing for proper technique and to facilitate improved neuromuscular control, strength, range of motion, and functional ability resulting in improved performance and form.   PATIENT EDUCATION:  Education details: Exercise purpose/form. Self management techniques. Education on diagnosis, prognosis, POC, anatomy and physiology of current condition. Education on HEP including handout. Person educated: Patient Education method: Explanation, Demonstration, Tactile cues, Verbal cues, and Handouts Education comprehension: verbalized understanding, returned demonstration, verbal cues required, tactile cues required, and needs further education  HOME EXERCISE PROGRAM: Access Code: [email or web address] URL: [email or web address] Date: 06/02/2023 Prepared by: Norton Blizzard  Exercises - Standing Weight Shift  - 3 x daily - 2-3 sets - 10 reps -  Seated Ankle Alphabet  - 3 x daily - 2-3 sets - Seated Heel Slide  - 3 x daily - 2 sets - 10 reps - 5 seconds hold - Toe Spreading  - 1-2 x daily - 1 sets - 20 reps - 4 seconds hold - Toe Yoga - Alternating Great Toe and Lesser Toe Extension  - 1-2 x daily - 1 sets - 20 reps - 4 seconds hold  ASSESSMENT:  CLINICAL IMPRESSION: ***  From initial PT evaluation on 06/02/2023:  Patient is a 32 y.o. female referred to outpatient physical therapy with a medical diagnosis of closed fracture of left ankle who presents with signs and symptoms consistent with left ankle pain, stiffness, weakness, and dysfunction s/p left open reduction internal fixation of ankle fracture with syndesmotic stabilization on 05/04/2023 2/2 left ankle fracture from fall on 04/22/2023. Patient presents with significant pain, ROM, joint stiffness, gait, tissue integrity, muscle performance (strength/power/endurance), and activity tolerance impairments that are limiting ability to complete her usual activities including basic household and community ambulation, transfers, self-care, stairs, ADL and IADLs, caring for her children, housework,  laundry, working, driving without difficulty. Patient will benefit from skilled physical therapy intervention to address current body structure impairments and activity limitations to improve function and work towards goals set in current POC in order to return to prior level of function or maximal functional improvement.   OBJECTIVE IMPAIRMENTS: Abnormal gait, decreased activity tolerance, decreased balance, decreased coordination, decreased endurance, decreased knowledge of condition, decreased knowledge of use of DME, decreased mobility, difficulty walking, decreased ROM, decreased strength, hypomobility, increased edema, increased fascial restrictions, impaired perceived functional ability, impaired flexibility, impaired sensation, improper body mechanics, obesity, and pain.   ACTIVITY LIMITATIONS:  carrying, lifting, bending, sitting, standing, squatting, stairs, transfers, bed mobility, bathing, dressing, reach over head, hygiene/grooming, locomotion level, and caring for others  PARTICIPATION LIMITATIONS: meal prep, cleaning, laundry, interpersonal relationship, driving, shopping, community activity, occupation, and yard work  PERSONAL FACTORS: Past/current experiences, Transportation, and 1-2 comorbidities: migraines, pelvic floor dysfunction/pain  are also affecting patient's functional outcome.   REHAB POTENTIAL: Good  CLINICAL DECISION MAKING: Stable/uncomplicated  EVALUATION COMPLEXITY: Low   GOALS: Goals reviewed with patient? No  SHORT TERM GOALS: Target date: 06/16/2023  Patient will be independent with initial home exercise program for self-management of symptoms. Baseline: Initial HEP provided at IE (06/02/23); Goal status: INITIAL   LONG TERM GOALS: Target date: 08/25/2023  Patient will be independent with a long-term home exercise program for self-management of symptoms.  Baseline: Initial HEP provided at IE (06/02/23); Goal status: INITIAL  2.  Patient will demonstrate improved FOTO to equal or greater than 68 by visit #15 to demonstrate improvement in overall condition and self-reported functional ability.  Baseline: 38 (06/02/23); Goal status: INITIAL  3.  Patient will demonstrate the ability to ambulate equal or greater than 1300 feet on 6 Minute Walk Test with no gait deviations to improve her ability to complete community mobility.  Baseline: not tested - pt NWB on crutches at initial eval (06/02/23); Goal status: INITIAL  4.  Patient will demonstrate L ankle PROM within 90% of right ankle PROM to improve her ability to ambulate normally and complete stairs.  Baseline: Initial AROM measured at left ankle significantly less than right  (06/02/23); Goal status: INITIAL  5.  Patient will demonstrate improvement in Patient Specific Functional Scale (PSFS)  of equal or greater than 3 points to reflect clinically significant improvement in patient's most valued functional activities. Baseline: to be measured visit #2 as appropriate (06/02/23); Goal status: INITIAL  6.  Patient will demonstrate the ability to perform 20 single leg heel raises on L LE to help normalize gait pattern and improve her ability to return to her exercise routine, complete stairs, and care for her children safely and effectively.  Baseline: unable to weight bear on L LE (06/02/23); Goal status: INITIAL    PLAN:  PT FREQUENCY: 1-2x/week  PT DURATION: 12 weeks  PLANNED INTERVENTIONS: 97164- PT Re-evaluation, 97110-Therapeutic exercises, 97530- Therapeutic activity, 97112- Neuromuscular re-education, 97535- Self Care, 10272- Manual therapy, [phone]- Gait training, 97014- Electrical stimulation (unattended), Patient/Family education, Balance training, Stair training, Dry Needling, Joint mobilization, Spinal mobilization, Cryotherapy, and Moist heat  PLAN FOR NEXT SESSION: update HEP as appropriate, progressive LE/functional strengthening/ROM/balance as appropriate. Normalize weight bearing and gait mechanics within physician's and tissue healing limitations. Education.    Luretha Murphy. Ilsa Iha, PT, DPT 06/03/23, 10:41 PM  [facility] [address] [place], [place] [address] P: [phone] I F: [phone]

## 2023-06-07 ENCOUNTER — Ambulatory Visit: Payer: Medicaid Other | Admitting: Physical Therapy

## 2023-06-08 ENCOUNTER — Ambulatory Visit: Payer: Medicaid Other | Admitting: Physical Therapy

## 2023-06-08 DIAGNOSIS — M25672 Stiffness of left ankle, not elsewhere classified: Secondary | ICD-10-CM

## 2023-06-08 DIAGNOSIS — M6281 Muscle weakness (generalized): Secondary | ICD-10-CM

## 2023-06-08 DIAGNOSIS — R262 Difficulty in walking, not elsewhere classified: Secondary | ICD-10-CM

## 2023-06-08 DIAGNOSIS — M25572 Pain in left ankle and joints of left foot: Secondary | ICD-10-CM

## 2023-06-08 NOTE — Therapy (Unsigned)
OUTPATIENT PHYSICAL THERAPY TREATMENT   Patient Name: Olivia Frost MRN: [id number] DOB:02-20-1991, 32 y.o., female Today's Date: 06/09/2023  END OF SESSION:  PT End of Session - 06/09/23 0850     Visit Number 3    Number of Visits 17    Date for PT Re-Evaluation 08/25/23    Authorization Type [place] [employer] reporting period from 06/02/2023    Authorization Time Period needs auth    Authorization - Number of Visits 2    Progress Note Due on Visit 10    PT Start Time 1435    PT Stop Time 1518    PT Time Calculation (min) 43 min    Activity Tolerance Patient tolerated treatment well    Behavior During Therapy [facility] for tasks assessed/performed              Past Medical History:  Diagnosis Date   Anemia during pregnancy in third trimester 10/09/2022   COVID 03/26/2022   Migraines    Obesity in pregnancy 09/03/2022   Supervision of other normal pregnancy, antepartum 04/22/2022              Clinical Staff    Provider      Office Location     [facility]    Dating     11/21/2022, by Patient Reported         Language     English    Anatomy US     Normal       Flu Vaccine     offer    Genetic Screen     NIPS: Negative      TDaP vaccine      09/03/22    Hgb A1C or   GTT    Early :  Third trimester : 59      Covid    Has booster         LAB RESULTS       Rhogam     B/Positiv   SVD (spontaneous vaginal delivery) 10/07/2022   Trimalleolar fracture 03/2023   left   Past Surgical History:  Procedure Laterality Date   ORIF ANKLE FRACTURE Left 05/04/2023   Procedure: OPEN REDUCTION INTERNAL FIXATION (ORIF) ANKLE FRACTURE;  Surgeon: Candelaria Stagers, DPM;  Location: [facility] ORS;  Service: Orthopedics/Podiatry;  Laterality: Left;  POPLITEAL/SAPHENOUS BLOCK   WRIST SURGERY Left    Patient Active Problem List   Diagnosis Date Noted   Intrauterine device 01/11/2023    PCP: no PCP  REFERRING PROVIDER: Candelaria Stagers, DPM  REFERRING DIAG: closed fracture of left ankle  THERAPY  DIAG:  Pain in left ankle and joints of left foot  Stiffness of left ankle, not elsewhere classified  Muscle weakness (generalized)  Difficulty in walking, not elsewhere classified  Rationale for Evaluation and Treatment: Rehabilitation  ONSET DATE: fracture on 04/22/2023 and surgery 05/04/2023  SUBJECTIVE:   PERTINENT HISTORY: Patient is a 32 y.o. female who presents to outpatient physical therapy with a referral for medical diagnosis closed fracture of left ankle. This patient's chief complaints consist of left ankle pain, stiffness, and dysfunction s/p L ORIF of left ankle fracture with syndesmotic stabilization on 05/04/2023,  leading to the following functional deficits: difficulty with usual activities including basic household and community ambulation, transfers, self-care, stairs, ADL and IADLs, caring for her children, housework, laundry, working, driving without difficulty. Relevant past medical history and comorbidities include has a past medical history of pelvic floor dysfunction, disabling pelvic pain post  partum, Anemia during pregnancy in third trimester (10/09/2022), Migraines, and Trimalleolar fracture (03/2023). Patient denies hx of cancer, stroke, seizures, lung problems, heart problems, diabetes, unexplained weight loss, unexplained changes in bowel or bladder problems, unexplained stumbling or dropping things, osteoporosis, and spinal surgery  SUBJECTIVE STATEMENT: She has been walking more and using her crutches.  She mentioned trying to walk more smoothly. She reports slight soreness/tenderness after last session but nothing out of the ordinary. HEP has been going pretty well, but noted swelling and soreness at back of ankle.  She also reports taking her first shower.  PAIN: Are you having pain? Yes NPRS: 4/10   PRECAUTIONS: fall, weight bearing restrictions (see below), has not been cleared to do exercise due to pelvic floor.   Dr. Allena Katz replied: "nothing specific,  just strengthening the leg and getting her to ambulate to being WBAT in boot" when asked if he had a specific protocol (by instant message on 06/02/2023).   WEIGHT BEARING RESTRICTIONS: Yes wean to WBAT in cam boot    PATIENT GOALS: "hoping it will be as close to normalcy as possible" "would love to get back into walking without boot and to slowly start to integrate exercise"  NEXT MD VISIT: no appointment made yet but should be 4 weeks from 05/25/2023.   OBJECTIVE  VITALS:  BP: 108/68 SpO2: 97% Pulse: 87bpm  SELF-REPORTED FUNCTION Patient Specific Functional Scale (PSFS)  Walking regularly: 4 Jog/run/exercise: 0 Dance: 0 Average: 1.33  Edema noted across the achilles and foot upon inspection and palpation. Skin was slightly warm to touch, but WNL for post-op stage Incision site looked clear with no signs of infection  TODAY'S TREATMENT:     Palpation of posterior ankle and achilles and inspection of incision cite (see above)  Therapeutic exercise: to centralize symptoms and improve ROM, strength, muscular endurance, and activity tolerance required for successful completion of functional activities.   Ankle alphabet: 1 round A-Z  Education on HEP  Patient repeated approx 5-10 reps of the following exercises to assess HEP progress: seated L > R foot Toe Yoga: great toe extension with small toes flexion pressure into floor (challenging); small toes extension with great toe flexion pressure into floor (challenging). Ball of foot and heel maintains contact with floor. To improve intrinsic foot muscle activation and strength in order to better support arch and intrinsic foot structures.     seated AAROM left ankle dorsiflexion foot slide  - seated L > R foot Toe splays, repeated 4 sec holds. Ball of foot and heel maintains contact with floor. To improve intrinsic foot muscle activation and strength in order to better support arch and intrinsic foot structures.     - seated AAROM  left great toe extension with gentle AROM plantar flexion  Ankle 4 way: 10 reps each way (DF, PF, INV, EV) AROM, 15 reps each way with yellow theraband and PT applying resistance  Therapeutic activities: dynamic activities for functional strengthening and improved functional activity tolerance.  Ambulation without crutches - approx 70 feet  Step forward and backwards with Left WB with CAM boot to improve gait mechanics: approx 15 reps   Pt required multimodal cuing for proper technique and to facilitate improved neuromuscular control, strength, range of motion, and functional ability resulting in improved performance and form.   PATIENT EDUCATION:  Education details: Exercise purpose/form. Self management techniques. Education on diagnosis, prognosis, POC, anatomy and physiology of current condition. Education on HEP including handout. Person educated: Patient Education method: Explanation,  Demonstration, Tactile cues, Verbal cues, and Handouts Education comprehension: verbalized understanding, returned demonstration, verbal cues required, tactile cues required, and needs further education  HOME EXERCISE PROGRAM: Access Code: [email or web address] URL: [email or web address] Date: 06/02/2023 Prepared by: Norton Blizzard  Exercises - Standing Weight Shift  - 3 x daily - 2-3 sets - 10 reps - Seated Ankle Alphabet  - 3 x daily - 2-3 sets - Seated Heel Slide  - 3 x daily - 2 sets - 10 reps - 5 seconds hold - Toe Spreading  - 1-2 x daily - 1 sets - 20 reps - 4 seconds hold - Toe Yoga - Alternating Great Toe and Lesser Toe Extension  - 1-2 x daily - 1 sets - 20 reps - 4 seconds hold  ASSESSMENT:  CLINICAL IMPRESSION: Patient tolerated session well, and she demonstrated significant improvement in her WB and gait mechanics with decreased need for AD. Session focused on progressing ROM exercises and introducing low intensity open chain strength training.  Patient would benefit from continued  management of limiting condition by skilled physical therapist to address remaining impairments and functional limitations to work towards stated goals and return to PLOF or maximal functional independence.   From initial PT evaluation on 06/02/2023:  Patient is a 32 y.o. female referred to outpatient physical therapy with a medical diagnosis of closed fracture of left ankle who presents with signs and symptoms consistent with left ankle pain, stiffness, weakness, and dysfunction s/p left open reduction internal fixation of ankle fracture with syndesmotic stabilization on 05/04/2023 2/2 left ankle fracture from fall on 04/22/2023. Patient presents with significant pain, ROM, joint stiffness, gait, tissue integrity, muscle performance (strength/power/endurance), and activity tolerance impairments that are limiting ability to complete her usual activities including basic household and community ambulation, transfers, self-care, stairs, ADL and IADLs, caring for her children, housework, laundry, working, driving without difficulty. Patient will benefit from skilled physical therapy intervention to address current body structure impairments and activity limitations to improve function and work towards goals set in current POC in order to return to prior level of function or maximal functional improvement.   OBJECTIVE IMPAIRMENTS: Abnormal gait, decreased activity tolerance, decreased balance, decreased coordination, decreased endurance, decreased knowledge of condition, decreased knowledge of use of DME, decreased mobility, difficulty walking, decreased ROM, decreased strength, hypomobility, increased edema, increased fascial restrictions, impaired perceived functional ability, impaired flexibility, impaired sensation, improper body mechanics, obesity, and pain.   ACTIVITY LIMITATIONS: carrying, lifting, bending, sitting, standing, squatting, stairs, transfers, bed mobility, bathing, dressing, reach over head,  hygiene/grooming, locomotion level, and caring for others  PARTICIPATION LIMITATIONS: meal prep, cleaning, laundry, interpersonal relationship, driving, shopping, community activity, occupation, and yard work  PERSONAL FACTORS: Past/current experiences, Transportation, and 1-2 comorbidities: migraines, pelvic floor dysfunction/pain  are also affecting patient's functional outcome.   REHAB POTENTIAL: Good  CLINICAL DECISION MAKING: Stable/uncomplicated  EVALUATION COMPLEXITY: Low   GOALS: Goals reviewed with patient? No  SHORT TERM GOALS: Target date: 06/16/2023  Patient will be independent with initial home exercise program for self-management of symptoms. Baseline: Initial HEP provided at IE (06/02/23); Goal status: In-progress   LONG TERM GOALS: Target date: 08/25/2023  Patient will be independent with a long-term home exercise program for self-management of symptoms.  Baseline: Initial HEP provided at IE (06/02/23); Goal status: In-progress  2.  Patient will demonstrate improved FOTO to equal or greater than 68 by visit #15 to demonstrate improvement in overall condition and self-reported functional ability.  Baseline: 38 (06/02/23); Goal status: In-progress  3.  Patient will demonstrate the ability to ambulate equal or greater than 1300 feet on 6 Minute Walk Test with no gait deviations to improve her ability to complete community mobility.  Baseline: not tested - pt NWB on crutches at initial eval (06/02/23); Goal status: In-progress  4.  Patient will demonstrate L ankle PROM within 90% of right ankle PROM to improve her ability to ambulate normally and complete stairs.  Baseline: Initial AROM measured at left ankle significantly less than right  (06/02/23); Goal status: In-progress  5.  Patient will demonstrate improvement in Patient Specific Functional Scale (PSFS) of equal or greater than 3 points to reflect clinically significant improvement in patient's most valued  functional activities. Baseline: to be measured visit #2 as appropriate (06/02/23); 1.33 (06/09/2023);  Goal status: In-progress  6.  Patient will demonstrate the ability to perform 20 single leg heel raises on L LE to help normalize gait pattern and improve her ability to return to her exercise routine, complete stairs, and care for her children safely and effectively.  Baseline: unable to weight bear on L LE (06/02/23); Goal status: In-progress    PLAN:  PT FREQUENCY: 1-2x/week  PT DURATION: 12 weeks  PLANNED INTERVENTIONS: 97164- PT Re-evaluation, 97110-Therapeutic exercises, 97530- Therapeutic activity, 97112- Neuromuscular re-education, 97535- Self Care, 13086- Manual therapy, [phone]- Gait training, 97014- Electrical stimulation (unattended), Patient/Family education, Balance training, Stair training, Dry Needling, Joint mobilization, Spinal mobilization, Cryotherapy, and Moist heat  PLAN FOR NEXT SESSION: update HEP as appropriate, progressive LE/functional strengthening/ROM/balance as appropriate. Normalize weight bearing and gait mechanics within physician's and tissue healing limitations. Education.   Matthew Saras, SPT  Luretha Murphy. Ilsa Iha, PT, DPT 06/09/23, 9:30 AM  [facility] [address] [place], [place] [address] P: [phone] I F: [phone]

## 2023-06-09 ENCOUNTER — Ambulatory Visit: Payer: Medicaid Other | Admitting: Physical Therapy

## 2023-06-09 ENCOUNTER — Encounter: Payer: Self-pay | Admitting: Physical Therapy

## 2023-06-10 ENCOUNTER — Ambulatory Visit: Payer: Medicaid Other | Admitting: Physical Therapy

## 2023-06-14 ENCOUNTER — Encounter: Payer: Self-pay | Admitting: Physical Therapy

## 2023-06-14 ENCOUNTER — Ambulatory Visit: Payer: Medicaid Other | Admitting: Physical Therapy

## 2023-06-14 DIAGNOSIS — M25672 Stiffness of left ankle, not elsewhere classified: Secondary | ICD-10-CM

## 2023-06-14 DIAGNOSIS — R262 Difficulty in walking, not elsewhere classified: Secondary | ICD-10-CM

## 2023-06-14 DIAGNOSIS — M25572 Pain in left ankle and joints of left foot: Secondary | ICD-10-CM | POA: Diagnosis not present

## 2023-06-14 DIAGNOSIS — M6281 Muscle weakness (generalized): Secondary | ICD-10-CM

## 2023-06-14 NOTE — Therapy (Signed)
OUTPATIENT PHYSICAL THERAPY TREATMENT   Patient Name: Olivia Frost MRN: [id number] DOB:1990-09-22, 32 y.o., female Today's Date: 06/14/2023  END OF SESSION:  PT End of Session - 06/09/23 0850     Visit Number 2    Number of Visits 17    Date for PT Re-Evaluation 08/25/23    Authorization Type [place] [employer] reporting period from 06/02/2023    Authorization Time Period needs auth    Authorization - Number of Visits 1   Progress Note Due on Visit 10    PT Start Time 1435    PT Stop Time 1518    PT Time Calculation (min) 43 min    Activity Tolerance Patient tolerated treatment well    Behavior During Therapy [facility] for tasks assessed/performed              Past Medical History:  Diagnosis Date   Anemia during pregnancy in third trimester 10/09/2022   COVID 03/26/2022   Migraines    Obesity in pregnancy 09/03/2022   Supervision of other normal pregnancy, antepartum 04/22/2022              Clinical Staff    Provider      Office Location      Ob/Gyn    Dating     11/21/2022, by Patient Reported         Language     English    Anatomy US     Normal       Flu Vaccine     offer    Genetic Screen     NIPS: Negative      TDaP vaccine      09/03/22    Hgb A1C or   GTT    Early :  Third trimester : 30      Covid    Has booster         LAB RESULTS       Rhogam     B/Positiv   SVD (spontaneous vaginal delivery) 10/07/2022   Trimalleolar fracture 03/2023   left   Past Surgical History:  Procedure Laterality Date   ORIF ANKLE FRACTURE Left 05/04/2023   Procedure: OPEN REDUCTION INTERNAL FIXATION (ORIF) ANKLE FRACTURE;  Surgeon: Candelaria Stagers, DPM;  Location: [facility] ORS;  Service: Orthopedics/Podiatry;  Laterality: Left;  POPLITEAL/SAPHENOUS BLOCK   WRIST SURGERY Left    Patient Active Problem List   Diagnosis Date Noted   Intrauterine device 01/11/2023    PCP: no PCP  REFERRING PROVIDER: Candelaria Stagers, DPM  REFERRING DIAG: closed fracture of left ankle  THERAPY  DIAG:  Pain in left ankle and joints of left foot  Stiffness of left ankle, not elsewhere classified  Muscle weakness (generalized)  Difficulty in walking, not elsewhere classified  Rationale for Evaluation and Treatment: Rehabilitation  ONSET DATE: fracture on 04/22/2023 and surgery 05/04/2023  SUBJECTIVE:   PERTINENT HISTORY: Patient is a 32 y.o. female who presents to outpatient physical therapy with a referral for medical diagnosis closed fracture of left ankle. This patient's chief complaints consist of left ankle pain, stiffness, and dysfunction s/p L ORIF of left ankle fracture with syndesmotic stabilization on 05/04/2023,  leading to the following functional deficits: difficulty with usual activities including basic household and community ambulation, transfers, self-care, stairs, ADL and IADLs, caring for her children, housework, laundry, working, driving without difficulty. Relevant past medical history and comorbidities include has a past medical history of pelvic floor dysfunction, disabling pelvic pain post partum,  Anemia during pregnancy in third trimester (10/09/2022), Migraines, and Trimalleolar fracture (03/2023). Patient denies hx of cancer, stroke, seizures, lung problems, heart problems, diabetes, unexplained weight loss, unexplained changes in bowel or bladder problems, unexplained stumbling or dropping things, osteoporosis, and spinal surgery  SUBJECTIVE STATEMENT: Patient reports increased pain in her left ankle at medial, latera, and posterior ankle. She states she felt okay on Wednesday and Thursday but it started hurting a lot on Friday. She went to a game on Saturday. She has continued to have pain and use both crutches. She has been using ice as well. She states her HEP has been going well. She tried to use the bands on Friday when the soreness started, but she thinks she may have irritated it more.  She plans to go back to her apartment at the end of this week. She is  most worried about going out of the apartment.   PAIN: Are you having pain? Yes NPRS: 6/10 left medial, lateral, and posterior ankle.    PRECAUTIONS: fall, weight bearing restrictions (see below), has not been cleared to do exercise due to pelvic floor.   Dr. Allena Katz replied: "nothing specific, just strengthening the leg and getting her to ambulate to being WBAT in boot" when asked if he had a specific protocol (by instant message on 06/02/2023).   WEIGHT BEARING RESTRICTIONS: Yes wean to WBAT in cam boot    PATIENT GOALS: "hoping it will be as close to normalcy as possible" "would love to get back into walking without boot and to slowly start to integrate exercise"  NEXT MD VISIT: no appointment made yet but should be 4 weeks from 05/25/2023.   OBJECTIVE    Edema noted across the achilles and foot upon inspection and palpation. Skin was slightly warm to touch, but WNL for post-op stage Incision site looked clear with no signs of infection  TODAY'S TREATMENT:       Therapeutic exercise: to centralize symptoms and improve ROM, strength, muscular endurance, and activity tolerance required for successful completion of functional activities.   - NuStep level 1 using bilateral upper and lower extremities with left CAM boot donned.  Seat/handle setting 11/12. For improved extremity mobility, muscular endurance, and activity tolerance; and to induce the analgesic effect of aerobic exercise, stimulate improved joint nutrition, and prepare body structures and systems for following interventions. x 5  minutes. Average SPM = 5:30. Cued to keep SPM above 60.  - Seated left ankle BAPS board, level 3/2, plantarflexion/dorsiflexion, inversion/eversion, circles clockwise, circles counter clockwise, 1x20 reps each direction (dropped to level 2 for circles). Pt required cuing to keep knee still and instruction on how to perform exercise.  - seated left ankle closed chain DF/PF AAROM on furniture slider,  1x20. - seated L ankle closed chain AAROM great/lesser toe extension on furniture slider, 1x20 with 5 second holds.  - education on calf muscle pump for swelling combined with laying with L foot above heart.   Therapeutic activities: dynamic activities for functional strengthening and improved functional activity tolerance. - ascend/descend four 6-inch steps x2 with B axial crutches and CAM boot on left LE. Practiced leading up with right LE and down with L LE to prepare for entering/exiting her apartment at the end of the week.   Pt required multimodal cuing for proper technique and to facilitate improved neuromuscular control, strength, range of motion, and functional ability resulting in improved performance and form.   PATIENT EDUCATION:  Education details: Exercise purpose/form. Self management techniques.  Person educated: Patient Education method: Explanation, Demonstration, Tactile cues, Verbal cues, and Handouts Education comprehension: verbalized understanding, returned demonstration, verbal cues required, tactile cues required, and needs further education  HOME EXERCISE PROGRAM: Access Code: [email or web address] URL: [email or web address] Date: 06/02/2023 Prepared by: Norton Blizzard  Exercises - Standing Weight Shift  - 3 x daily - 2-3 sets - 10 reps - Seated Ankle Alphabet  - 3 x daily - 2-3 sets - Seated Heel Slide  - 3 x daily - 2 sets - 10 reps - 5 seconds hold - Toe Spreading  - 1-2 x daily - 1 sets - 20 reps - 4 seconds hold - Toe Yoga - Alternating Great Toe and Lesser Toe Extension  - 1-2 x daily - 1 sets - 20 reps - 4 seconds hold  ASSESSMENT:  CLINICAL IMPRESSION: Patient arrives with elevated pain. Inspection of the left ankle showed continued swelling at lower leg and ankle but no excessive redness or warmth to suggest infection. Today's session focused on gentle AROM/AAROM and motor control exercises with the BAPS board. No change in pain by end of session but  patient reported ankle felt less stiff and she felt better overall. Patient would benefit from continued management of limiting condition by skilled physical therapist to address remaining impairments and functional limitations to work towards stated goals and return to PLOF or maximal functional independence.   From initial PT evaluation on 06/02/2023:  Patient is a 32 y.o. female referred to outpatient physical therapy with a medical diagnosis of closed fracture of left ankle who presents with signs and symptoms consistent with left ankle pain, stiffness, weakness, and dysfunction s/p left open reduction internal fixation of ankle fracture with syndesmotic stabilization on 05/04/2023 2/2 left ankle fracture from fall on 04/22/2023. Patient presents with significant pain, ROM, joint stiffness, gait, tissue integrity, muscle performance (strength/power/endurance), and activity tolerance impairments that are limiting ability to complete her usual activities including basic household and community ambulation, transfers, self-care, stairs, ADL and IADLs, caring for her children, housework, laundry, working, driving without difficulty. Patient will benefit from skilled physical therapy intervention to address current body structure impairments and activity limitations to improve function and work towards goals set in current POC in order to return to prior level of function or maximal functional improvement.   OBJECTIVE IMPAIRMENTS: Abnormal gait, decreased activity tolerance, decreased balance, decreased coordination, decreased endurance, decreased knowledge of condition, decreased knowledge of use of DME, decreased mobility, difficulty walking, decreased ROM, decreased strength, hypomobility, increased edema, increased fascial restrictions, impaired perceived functional ability, impaired flexibility, impaired sensation, improper body mechanics, obesity, and pain.   ACTIVITY LIMITATIONS: carrying, lifting,  bending, sitting, standing, squatting, stairs, transfers, bed mobility, bathing, dressing, reach over head, hygiene/grooming, locomotion level, and caring for others  PARTICIPATION LIMITATIONS: meal prep, cleaning, laundry, interpersonal relationship, driving, shopping, community activity, occupation, and yard work  PERSONAL FACTORS: Past/current experiences, Transportation, and 1-2 comorbidities: migraines, pelvic floor dysfunction/pain  are also affecting patient's functional outcome.   REHAB POTENTIAL: Good  CLINICAL DECISION MAKING: Stable/uncomplicated  EVALUATION COMPLEXITY: Low   GOALS: Goals reviewed with patient? No  SHORT TERM GOALS: Target date: 06/16/2023  Patient will be independent with initial home exercise program for self-management of symptoms. Baseline: Initial HEP provided at IE (06/02/23); Goal status: In-progress   LONG TERM GOALS: Target date: 08/25/2023  Patient will be independent with a long-term home exercise program for self-management of symptoms.  Baseline: Initial HEP provided at IE (06/02/23); Goal status: In-progress  2.  Patient will demonstrate improved FOTO to equal or greater than 68 by visit #15 to demonstrate improvement in overall condition and self-reported functional ability.  Baseline: 38 (06/02/23); Goal status: In-progress  3.  Patient will demonstrate the ability to ambulate equal or greater than 1300 feet on 6 Minute Walk Test with no gait deviations to improve her ability to complete community mobility.  Baseline: not tested - pt NWB on crutches at initial eval (06/02/23); Goal status: In-progress  4.  Patient will demonstrate L ankle PROM within 90% of right ankle PROM to improve her ability to ambulate normally and complete stairs.  Baseline: Initial AROM measured at left ankle significantly less than right  (06/02/23); Goal status: In-progress  5.  Patient will demonstrate improvement in Patient Specific Functional Scale  (PSFS) of equal or greater than 3 points to reflect clinically significant improvement in patient's most valued functional activities. Baseline: to be measured visit #2 as appropriate (06/02/23); 1.33 (06/09/2023);  Goal status: In-progress  6.  Patient will demonstrate the ability to perform 20 single leg heel raises on L LE to help normalize gait pattern and improve her ability to return to her exercise routine, complete stairs, and care for her children safely and effectively.  Baseline: unable to weight bear on L LE (06/02/23); Goal status: In-progress    PLAN:  PT FREQUENCY: 1-2x/week  PT DURATION: 12 weeks  PLANNED INTERVENTIONS: 97164- PT Re-evaluation, 97110-Therapeutic exercises, 97530- Therapeutic activity, 97112- Neuromuscular re-education, 97535- Self Care, 16109- Manual therapy, [phone]- Gait training, 97014- Electrical stimulation (unattended), Patient/Family education, Balance training, Stair training, Dry Needling, Joint mobilization, Spinal mobilization, Cryotherapy, and Moist heat  PLAN FOR NEXT SESSION: update HEP as appropriate, progressive LE/functional strengthening/ROM/balance as appropriate. Normalize weight bearing and gait mechanics within physician's and tissue healing limitations. Education.   Luretha Murphy. Ilsa Iha, PT, DPT 06/14/23, 8:35 PM  [facility] [address] [place], [place] [address] P: [phone] I F: [phone]

## 2023-06-16 ENCOUNTER — Ambulatory Visit: Payer: Medicaid Other | Admitting: Physical Therapy

## 2023-06-16 ENCOUNTER — Encounter: Payer: Self-pay | Admitting: Physical Therapy

## 2023-06-16 DIAGNOSIS — M6281 Muscle weakness (generalized): Secondary | ICD-10-CM

## 2023-06-16 DIAGNOSIS — M25572 Pain in left ankle and joints of left foot: Secondary | ICD-10-CM

## 2023-06-16 DIAGNOSIS — R262 Difficulty in walking, not elsewhere classified: Secondary | ICD-10-CM

## 2023-06-16 DIAGNOSIS — M25672 Stiffness of left ankle, not elsewhere classified: Secondary | ICD-10-CM

## 2023-06-16 NOTE — Therapy (Signed)
OUTPATIENT PHYSICAL THERAPY TREATMENT   Patient Name: Olivia Frost MRN: [id number] DOB:September 18, 1990, 32 y.o., female Today's Date: 06/16/2023  END OF SESSION:  PT End of Session - 06/16/23 1524     Visit Number 4    Number of Visits 17    Date for PT Re-Evaluation 08/25/23    Authorization Type [place] [employer] reporting period from 06/02/2023    Authorization Time Period [employer] auth#[id number] 12/9-08/06/23 for 16 PT visits    Authorization - Visit Number 3    Authorization - Number of Visits 16    Progress Note Due on Visit 10    PT Start Time 1522    PT Stop Time 1600    PT Time Calculation (min) 38 min    Activity Tolerance Patient tolerated treatment well    Behavior During Therapy [facility] for tasks assessed/performed              Past Medical History:  Diagnosis Date   Anemia during pregnancy in third trimester 10/09/2022   COVID 03/26/2022   Migraines    Obesity in pregnancy 09/03/2022   Supervision of other normal pregnancy, antepartum 04/22/2022              Clinical Staff    Provider      Office Location     [facility]    Dating     11/21/2022, by Patient Reported         Language     English    Anatomy US     Normal       Flu Vaccine     offer    Genetic Screen     NIPS: Negative      TDaP vaccine      09/03/22    Hgb A1C or   GTT    Early :  Third trimester : 59      Covid    Has booster         LAB RESULTS       Rhogam     B/Positiv   SVD (spontaneous vaginal delivery) 10/07/2022   Trimalleolar fracture 03/2023   left   Past Surgical History:  Procedure Laterality Date   ORIF ANKLE FRACTURE Left 05/04/2023   Procedure: OPEN REDUCTION INTERNAL FIXATION (ORIF) ANKLE FRACTURE;  Surgeon: Candelaria Stagers, DPM;  Location: [facility] ORS;  Service: Orthopedics/Podiatry;  Laterality: Left;  POPLITEAL/SAPHENOUS BLOCK   WRIST SURGERY Left    Patient Active Problem List   Diagnosis Date Noted   Intrauterine device 01/11/2023    PCP: no PCP  REFERRING  PROVIDER: Candelaria Stagers, DPM  REFERRING DIAG: closed fracture of left ankle  THERAPY DIAG:  Pain in left ankle and joints of left foot  Stiffness of left ankle, not elsewhere classified  Muscle weakness (generalized)  Difficulty in walking, not elsewhere classified  Rationale for Evaluation and Treatment: Rehabilitation  ONSET DATE: fracture on 04/22/2023 and surgery 05/04/2023  SUBJECTIVE:   PERTINENT HISTORY: Patient is a 32 y.o. female who presents to outpatient physical therapy with a referral for medical diagnosis closed fracture of left ankle. This patient's chief complaints consist of left ankle pain, stiffness, and dysfunction s/p L ORIF of left ankle fracture with syndesmotic stabilization on 05/04/2023,  leading to the following functional deficits: difficulty with usual activities including basic household and community ambulation, transfers, self-care, stairs, ADL and IADLs, caring for her children, housework, laundry, working, driving without difficulty. Relevant past medical history and comorbidities include  has a past medical history of pelvic floor dysfunction, disabling pelvic pain post partum, Anemia during pregnancy in third trimester (10/09/2022), Migraines, and Trimalleolar fracture (03/2023). Patient denies hx of cancer, stroke, seizures, lung problems, heart problems, diabetes, unexplained weight loss, unexplained changes in bowel or bladder problems, unexplained stumbling or dropping things, osteoporosis, and spinal surgery  SUBJECTIVE STATEMENT: Patient arrived with CAM boot on left LE and using 1 axial crutch. She states she felt better for a little while after last PT session, but then the pain returned as she was up on her feet longer. She states her ankle hurt yesterday. She states it feels a little better today. She may do some in person shopping tomorrow if she feels okay.   PAIN: Are you having pain? Yes NPRS: 5/10 left medial, lateral, and posterior ankle  when she steps down on her left foot.    PRECAUTIONS: fall, weight bearing restrictions (see below), has not been cleared to do exercise due to pelvic floor.   Dr. Allena Katz replied: "nothing specific, just strengthening the leg and getting her to ambulate to being WBAT in boot" when asked if he had a specific protocol (by instant message on 06/02/2023).   WEIGHT BEARING RESTRICTIONS: Yes wean to WBAT in cam boot    PATIENT GOALS: "hoping it will be as close to normalcy as possible" "would love to get back into walking without boot and to slowly start to integrate exercise"  NEXT MD VISIT: no appointment made yet but should be 4 weeks from 05/25/2023.   OBJECTIVE  TODAY'S TREATMENT:     Therapeutic exercise: to centralize symptoms and improve ROM, strength, muscular endurance, and activity tolerance required for successful completion of functional activities.   - NuStep level 3 using bilateral upper and lower extremities with left CAM boot donned.  Seat/handle setting 11/12. For improved extremity mobility, muscular endurance, and activity tolerance; and to induce the analgesic effect of aerobic exercise, stimulate improved joint nutrition, and prepare body structures and systems for following interventions. x 5  minutes. Average SPM = 78. Cued to keep SPM above 60.  - Seated left ankle BAPS board, level 2, plantarflexion/dorsiflexion, inversion/eversion, circles clockwise, circles counter clockwise, 1x20 reps each direction. Pt required cuing to keep knee still. - seated left ankle closed chain DF/PF AAROM on furniture slider, 1x20. - seated L ankle closed chain AAROM great/lesser toe extension on furniture slider, 1x20 with 5 second holds.  - supine with lower legs propped on traction stool: L 4-way ankle, 1x20 each direction + 1x20 dorsiflexion with YTB loop (DF and INV band secured by PT, Inversion most painful)  Pt required multimodal cuing for proper technique and to facilitate improved  neuromuscular control, strength, range of motion, and functional ability resulting in improved performance and form.   PATIENT EDUCATION:  Education details: Exercise purpose/form. Self management techniques.  Person educated: Patient Education method: Explanation, Demonstration, Tactile cues, Verbal cues, and Handouts Education comprehension: verbalized understanding, returned demonstration, verbal cues required, tactile cues required, and needs further education  HOME EXERCISE PROGRAM: Access Code: [email or web address] URL: [email or web address] Date: 06/02/2023 Prepared by: Norton Blizzard  Exercises - Standing Weight Shift  - 3 x daily - 2-3 sets - 10 reps - Seated Ankle Alphabet  - 3 x daily - 2-3 sets - Seated Heel Slide  - 3 x daily - 2 sets - 10 reps - 5 seconds hold - Toe Spreading  - 1-2 x daily - 1 sets - 20 reps - 4 seconds hold -  Toe Yoga - Alternating Great Toe and Lesser Toe Extension  - 1-2 x daily - 1 sets - 20 reps - 4 seconds hold  ASSESSMENT:  CLINICAL IMPRESSION: Patient arrives with pain slightly improved from yesterday. Continued with motor control/muscular endurance and ROM exercises today as tolerated. Patient demonstrated improved confidence and ability with exercises. Patient would benefit from continued management of limiting condition by skilled physical therapist to address remaining impairments and functional limitations to work towards stated goals and return to PLOF or maximal functional independence.    From initial PT evaluation on 06/02/2023:  Patient is a 32 y.o. female referred to outpatient physical therapy with a medical diagnosis of closed fracture of left ankle who presents with signs and symptoms consistent with left ankle pain, stiffness, weakness, and dysfunction s/p left open reduction internal fixation of ankle fracture with syndesmotic stabilization on 05/04/2023 2/2 left ankle fracture from fall on 04/22/2023. Patient presents with significant  pain, ROM, joint stiffness, gait, tissue integrity, muscle performance (strength/power/endurance), and activity tolerance impairments that are limiting ability to complete her usual activities including basic household and community ambulation, transfers, self-care, stairs, ADL and IADLs, caring for her children, housework, laundry, working, driving without difficulty. Patient will benefit from skilled physical therapy intervention to address current body structure impairments and activity limitations to improve function and work towards goals set in current POC in order to return to prior level of function or maximal functional improvement.   OBJECTIVE IMPAIRMENTS: Abnormal gait, decreased activity tolerance, decreased balance, decreased coordination, decreased endurance, decreased knowledge of condition, decreased knowledge of use of DME, decreased mobility, difficulty walking, decreased ROM, decreased strength, hypomobility, increased edema, increased fascial restrictions, impaired perceived functional ability, impaired flexibility, impaired sensation, improper body mechanics, obesity, and pain.   ACTIVITY LIMITATIONS: carrying, lifting, bending, sitting, standing, squatting, stairs, transfers, bed mobility, bathing, dressing, reach over head, hygiene/grooming, locomotion level, and caring for others  PARTICIPATION LIMITATIONS: meal prep, cleaning, laundry, interpersonal relationship, driving, shopping, community activity, occupation, and yard work  PERSONAL FACTORS: Past/current experiences, Transportation, and 1-2 comorbidities: migraines, pelvic floor dysfunction/pain  are also affecting patient's functional outcome.   REHAB POTENTIAL: Good  CLINICAL DECISION MAKING: Stable/uncomplicated  EVALUATION COMPLEXITY: Low   GOALS: Goals reviewed with patient? No  SHORT TERM GOALS: Target date: 06/16/2023  Patient will be independent with initial home exercise program for self-management of  symptoms. Baseline: Initial HEP provided at IE (06/02/23); Goal status: In-progress   LONG TERM GOALS: Target date: 08/25/2023  Patient will be independent with a long-term home exercise program for self-management of symptoms.  Baseline: Initial HEP provided at IE (06/02/23); Goal status: In-progress  2.  Patient will demonstrate improved FOTO to equal or greater than 68 by visit #15 to demonstrate improvement in overall condition and self-reported functional ability.  Baseline: 38 (06/02/23); Goal status: In-progress  3.  Patient will demonstrate the ability to ambulate equal or greater than 1300 feet on 6 Minute Walk Test with no gait deviations to improve her ability to complete community mobility.  Baseline: not tested - pt NWB on crutches at initial eval (06/02/23); Goal status: In-progress  4.  Patient will demonstrate L ankle PROM within 90% of right ankle PROM to improve her ability to ambulate normally and complete stairs.  Baseline: Initial AROM measured at left ankle significantly less than right  (06/02/23); Goal status: In-progress  5.  Patient will demonstrate improvement in Patient Specific Functional Scale (PSFS) of equal or greater than 3 points to reflect  clinically significant improvement in patient's most valued functional activities. Baseline: to be measured visit #2 as appropriate (06/02/23); 1.33 (06/09/2023);  Goal status: In-progress  6.  Patient will demonstrate the ability to perform 20 single leg heel raises on L LE to help normalize gait pattern and improve her ability to return to her exercise routine, complete stairs, and care for her children safely and effectively.  Baseline: unable to weight bear on L LE (06/02/23); Goal status: In-progress    PLAN:  PT FREQUENCY: 1-2x/week  PT DURATION: 12 weeks  PLANNED INTERVENTIONS: 97164- PT Re-evaluation, 97110-Therapeutic exercises, 97530- Therapeutic activity, 97112- Neuromuscular re-education, 97535-  Self Care, 20254- Manual therapy, [phone]- Gait training, 97014- Electrical stimulation (unattended), Patient/Family education, Balance training, Stair training, Dry Needling, Joint mobilization, Spinal mobilization, Cryotherapy, and Moist heat  PLAN FOR NEXT SESSION: update HEP as appropriate, progressive LE/functional strengthening/ROM/balance as appropriate. Normalize weight bearing and gait mechanics within physician's and tissue healing limitations. Education.   Luretha Murphy. Ilsa Iha, PT, DPT 06/16/23, 4:06 PM  [facility] [address] [place], [place] [address] P: [phone] I F: [phone]

## 2023-06-21 ENCOUNTER — Ambulatory Visit: Payer: Medicaid Other | Admitting: Physical Therapy

## 2023-06-24 ENCOUNTER — Ambulatory Visit: Payer: Medicaid Other | Admitting: Physical Therapy

## 2023-06-24 ENCOUNTER — Encounter: Payer: Self-pay | Admitting: Physical Therapy

## 2023-06-24 DIAGNOSIS — M25572 Pain in left ankle and joints of left foot: Secondary | ICD-10-CM

## 2023-06-24 DIAGNOSIS — M6281 Muscle weakness (generalized): Secondary | ICD-10-CM

## 2023-06-24 DIAGNOSIS — M25672 Stiffness of left ankle, not elsewhere classified: Secondary | ICD-10-CM

## 2023-06-24 DIAGNOSIS — R262 Difficulty in walking, not elsewhere classified: Secondary | ICD-10-CM

## 2023-06-24 NOTE — Therapy (Signed)
OUTPATIENT PHYSICAL THERAPY TREATMENT   Patient Name: Olivia Frost MRN: [id number] DOB:01/01/1991, 32 y.o., female Today's Date: 06/24/2023  END OF SESSION:  PT End of Session - 06/24/23 1532     Visit Number 5    Number of Visits 17    Date for PT Re-Evaluation 08/25/23    Authorization Type [place] [employer] reporting period from 06/02/2023    Authorization Time Period [employer] auth#[id number] 12/9-08/06/23 for 16 PT visits    Authorization - Visit Number 4    Authorization - Number of Visits 16    Progress Note Due on Visit 10    PT Start Time 1520    PT Stop Time 1600    PT Time Calculation (min) 40 min    Activity Tolerance Patient tolerated treatment well    Behavior During Therapy [facility] for tasks assessed/performed               Past Medical History:  Diagnosis Date   Anemia during pregnancy in third trimester 10/09/2022   COVID 03/26/2022   Migraines    Obesity in pregnancy 09/03/2022   Supervision of other normal pregnancy, antepartum 04/22/2022              Clinical Staff    Provider      Office Location     [facility]    Dating     11/21/2022, by Patient Reported         Language     English    Anatomy US     Normal       Flu Vaccine     offer    Genetic Screen     NIPS: Negative      TDaP vaccine      09/03/22    Hgb A1C or   GTT    Early :  Third trimester : 78      Covid    Has booster         LAB RESULTS       Rhogam     B/Positiv   SVD (spontaneous vaginal delivery) 10/07/2022   Trimalleolar fracture 03/2023   left   Past Surgical History:  Procedure Laterality Date   ORIF ANKLE FRACTURE Left 05/04/2023   Procedure: OPEN REDUCTION INTERNAL FIXATION (ORIF) ANKLE FRACTURE;  Surgeon: Candelaria Stagers, DPM;  Location: [facility] ORS;  Service: Orthopedics/Podiatry;  Laterality: Left;  POPLITEAL/SAPHENOUS BLOCK   WRIST SURGERY Left    Patient Active Problem List   Diagnosis Date Noted   Intrauterine device 01/11/2023    PCP: no PCP  REFERRING  PROVIDER: Candelaria Stagers, DPM  REFERRING DIAG: closed fracture of left ankle  THERAPY DIAG:  Pain in left ankle and joints of left foot  Stiffness of left ankle, not elsewhere classified  Muscle weakness (generalized)  Difficulty in walking, not elsewhere classified  Rationale for Evaluation and Treatment: Rehabilitation  ONSET DATE: fracture on 04/22/2023 and surgery 05/04/2023  SUBJECTIVE:   PERTINENT HISTORY: Patient is a 32 y.o. female who presents to outpatient physical therapy with a referral for medical diagnosis closed fracture of left ankle. This patient's chief complaints consist of left ankle pain, stiffness, and dysfunction s/p L ORIF of left ankle fracture with syndesmotic stabilization on 05/04/2023,  leading to the following functional deficits: difficulty with usual activities including basic household and community ambulation, transfers, self-care, stairs, ADL and IADLs, caring for her children, housework, laundry, working, driving without difficulty. Relevant past medical history and comorbidities  include has a past medical history of pelvic floor dysfunction, disabling pelvic pain post partum, Anemia during pregnancy in third trimester (10/09/2022), Migraines, and Trimalleolar fracture (03/2023). Patient denies hx of cancer, stroke, seizures, lung problems, heart problems, diabetes, unexplained weight loss, unexplained changes in bowel or bladder problems, unexplained stumbling or dropping things, osteoporosis, and spinal surgery  SUBJECTIVE STATEMENT: Patient arrived with CAM boot on left LE with no AD. She states she has been up on her feet a lot with visitors over the holidays. She states moving back to her apartment is going pretty well. Her foot has felt okay part of the time and not as well some of the other times. She states it feels more swollen and looks more swollen to her. She has been doing her band exercise in the morning before it gets sore. The band exercises  feel pretty good at that point.   PAIN: Are you having pain? Yes NPRS: 3/10 left medial, lateral, and posterior ankle when she steps down on her left foot.    PRECAUTIONS: fall, weight bearing restrictions (see below), has not been cleared to do exercise due to pelvic floor.   Dr. Allena Katz replied: "nothing specific, just strengthening the leg and getting her to ambulate to being WBAT in boot" when asked if he had a specific protocol (by instant message on 06/02/2023).   WEIGHT BEARING RESTRICTIONS: Yes wean to WBAT in cam boot    PATIENT GOALS: "hoping it will be as close to normalcy as possible" "would love to get back into walking without boot and to slowly start to integrate exercise"  NEXT MD VISIT: 07/08/2023.   OBJECTIVE  Figure 8: L ankle 57.5 cm (last measured 12/26/204)  TODAY'S TREATMENT:     Therapeutic exercise: to centralize symptoms and improve ROM, strength, muscular endurance, and activity tolerance required for successful completion of functional activities.  - NuStep level 4 using bilateral upper and lower extremities with left CAM boot donned.  Seat/handle setting 11/12. For improved extremity mobility, muscular endurance, and activity tolerance; and to induce the analgesic effect of aerobic exercise, stimulate improved joint nutrition, and prepare body structures and systems for following interventions. x 7  minutes. Average SPM = 73. Cued to keep SPM above 60.  - left ankle figure 8 measurement (see above) - seated left ankle closed chain DF/PF AAROM on furniture slider, 1x20 with 5 second holds.  - sidelying L hip abduction AROM, 1x20 (added to HEP).  - hooklying bridge, 1x20 with 5 second hold, cam boot donned.  - education on scar massage with handout   Manual therapy: to reduce pain and tissue tension, improve range of motion, neuromodulation, in order to promote improved ability to complete functional activities. SEATED/SUPINE with feet on bolster over heart -  Scar massage performed by PT at incisions at left ankle - STM to soft tissue of L ankle and lower leg to improve swelling and decrease pain.   Pt required multimodal cuing for proper technique and to facilitate improved neuromuscular control, strength, range of motion, and functional ability resulting in improved performance and form.   PATIENT EDUCATION:  Education details: Exercise purpose/form. Self management techniques.  Person educated: Patient Education method: Explanation, Demonstration, Tactile cues, Verbal cues, and Handouts Education comprehension: verbalized understanding, returned demonstration, verbal cues required, tactile cues required, and needs further education  HOME EXERCISE PROGRAM: Access Code: [email or web address] URL: [email or web address] Date: 06/24/2023 Prepared by: Norton Blizzard  Exercises - Standing Weight Shift  - 3 x daily -  2-3 sets - 10 reps - Seated Ankle Alphabet  - 3 x daily - 2-3 sets - Seated Heel Slide  - 3 x daily - 2 sets - 10 reps - 5 seconds hold - Long Sitting Ankle Eversion with Resistance  - 1 x daily - 7 x weekly - 15 reps - Long Sitting Ankle Plantar Flexion with Resistance  - 1 x daily - 7 x weekly - 1 sets - 15 reps - Long Sitting Ankle Inversion with Resistance  - 1 x daily - 7 x weekly - 1 sets - 15 reps - Long Sitting Ankle Dorsiflexion with Anchored Resistance  - 1 x daily - 7 x weekly - 1 sets - 15 reps - Lateral Ankle Scar Massage  - 1 x daily - Sidelying Hip Abduction  - 1 x daily - 3 sets - 10 reps - 2 seconds hold - Bridge  - 1 x daily - 1 sets - 20 reps - 5 seconds hold  ASSESSMENT:  CLINICAL IMPRESSION: Patient arrives with continued L lower leg swelling and discomfort with walking. Took figure 8 measurement today to help track progress in swelling. Also utilized manual therapy to encourage clearance swelling. Patient reported feeling a bit sore from the manual therapy by end of session. Exercises for hip and core strength  were also added today with good tolerance. Patient continues to be limited by pain and swelling. Patient would benefit from continued management of limiting condition by skilled physical therapist to address remaining impairments and functional limitations to work towards stated goals and return to PLOF or maximal functional independence.   From initial PT evaluation on 06/02/2023:  Patient is a 32 y.o. female referred to outpatient physical therapy with a medical diagnosis of closed fracture of left ankle who presents with signs and symptoms consistent with left ankle pain, stiffness, weakness, and dysfunction s/p left open reduction internal fixation of ankle fracture with syndesmotic stabilization on 05/04/2023 2/2 left ankle fracture from fall on 04/22/2023. Patient presents with significant pain, ROM, joint stiffness, gait, tissue integrity, muscle performance (strength/power/endurance), and activity tolerance impairments that are limiting ability to complete her usual activities including basic household and community ambulation, transfers, self-care, stairs, ADL and IADLs, caring for her children, housework, laundry, working, driving without difficulty. Patient will benefit from skilled physical therapy intervention to address current body structure impairments and activity limitations to improve function and work towards goals set in current POC in order to return to prior level of function or maximal functional improvement.   OBJECTIVE IMPAIRMENTS: Abnormal gait, decreased activity tolerance, decreased balance, decreased coordination, decreased endurance, decreased knowledge of condition, decreased knowledge of use of DME, decreased mobility, difficulty walking, decreased ROM, decreased strength, hypomobility, increased edema, increased fascial restrictions, impaired perceived functional ability, impaired flexibility, impaired sensation, improper body mechanics, obesity, and pain.   ACTIVITY  LIMITATIONS: carrying, lifting, bending, sitting, standing, squatting, stairs, transfers, bed mobility, bathing, dressing, reach over head, hygiene/grooming, locomotion level, and caring for others  PARTICIPATION LIMITATIONS: meal prep, cleaning, laundry, interpersonal relationship, driving, shopping, community activity, occupation, and yard work  PERSONAL FACTORS: Past/current experiences, Transportation, and 1-2 comorbidities: migraines, pelvic floor dysfunction/pain  are also affecting patient's functional outcome.   REHAB POTENTIAL: Good  CLINICAL DECISION MAKING: Stable/uncomplicated  EVALUATION COMPLEXITY: Low   GOALS: Goals reviewed with patient? No  SHORT TERM GOALS: Target date: 06/16/2023  Patient will be independent with initial home exercise program for self-management of symptoms. Baseline: Initial HEP provided at IE (06/02/23); Goal  status: In-progress   LONG TERM GOALS: Target date: 08/25/2023  Patient will be independent with a long-term home exercise program for self-management of symptoms.  Baseline: Initial HEP provided at IE (06/02/23); Goal status: In-progress  2.  Patient will demonstrate improved FOTO to equal or greater than 68 by visit #15 to demonstrate improvement in overall condition and self-reported functional ability.  Baseline: 38 (06/02/23); Goal status: In-progress  3.  Patient will demonstrate the ability to ambulate equal or greater than 1300 feet on 6 Minute Walk Test with no gait deviations to improve her ability to complete community mobility.  Baseline: not tested - pt NWB on crutches at initial eval (06/02/23); Goal status: In-progress  4.  Patient will demonstrate L ankle PROM within 90% of right ankle PROM to improve her ability to ambulate normally and complete stairs.  Baseline: Initial AROM measured at left ankle significantly less than right  (06/02/23); Goal status: In-progress  5.  Patient will demonstrate improvement in Patient  Specific Functional Scale (PSFS) of equal or greater than 3 points to reflect clinically significant improvement in patient's most valued functional activities. Baseline: to be measured visit #2 as appropriate (06/02/23); 1.33 (06/09/2023);  Goal status: In-progress  6.  Patient will demonstrate the ability to perform 20 single leg heel raises on L LE to help normalize gait pattern and improve her ability to return to her exercise routine, complete stairs, and care for her children safely and effectively.  Baseline: unable to weight bear on L LE (06/02/23); Goal status: In-progress    PLAN:  PT FREQUENCY: 1-2x/week  PT DURATION: 12 weeks  PLANNED INTERVENTIONS: 97164- PT Re-evaluation, 97110-Therapeutic exercises, 97530- Therapeutic activity, 97112- Neuromuscular re-education, 97535- Self Care, 16109- Manual therapy, [phone]- Gait training, 97014- Electrical stimulation (unattended), Patient/Family education, Balance training, Stair training, Dry Needling, Joint mobilization, Spinal mobilization, Cryotherapy, and Moist heat  PLAN FOR NEXT SESSION: update HEP as appropriate, progressive LE/functional strengthening/ROM/balance as appropriate. Normalize weight bearing and gait mechanics within physician's and tissue healing limitations. Education.   Luretha Murphy. Ilsa Iha, PT, DPT 06/24/23, 6:47 PM  [facility] [address] [place], [place] [address] P: [phone] I F: [phone]

## 2023-06-28 ENCOUNTER — Encounter: Payer: Self-pay | Admitting: Physical Therapy

## 2023-06-28 ENCOUNTER — Ambulatory Visit: Payer: Self-pay | Admitting: Family Medicine

## 2023-06-28 ENCOUNTER — Ambulatory Visit: Payer: Medicaid Other | Admitting: Physical Therapy

## 2023-06-28 DIAGNOSIS — R262 Difficulty in walking, not elsewhere classified: Secondary | ICD-10-CM

## 2023-06-28 DIAGNOSIS — M25572 Pain in left ankle and joints of left foot: Secondary | ICD-10-CM

## 2023-06-28 DIAGNOSIS — M6281 Muscle weakness (generalized): Secondary | ICD-10-CM

## 2023-06-28 DIAGNOSIS — M25672 Stiffness of left ankle, not elsewhere classified: Secondary | ICD-10-CM

## 2023-06-28 NOTE — Therapy (Signed)
OUTPATIENT PHYSICAL THERAPY TREATMENT   Patient Name: Olivia Frost MRN: [id number] DOB:10-31-1990, 32 y.o., female Today's Date: 06/28/2023  END OF SESSION:  PT End of Session - 06/28/23 1353     Visit Number 6    Number of Visits 17    Date for PT Re-Evaluation 08/25/23    Authorization Type [place] [employer] reporting period from 06/02/2023    Authorization Time Period [employer] auth#[id number] 12/9-08/06/23 for 16 PT visits    Authorization - Visit Number 5    Authorization - Number of Visits 16    Progress Note Due on Visit 10    PT Start Time 1352   patient late   PT Stop Time 1430    PT Time Calculation (min) 38 min    Activity Tolerance Patient tolerated treatment well    Behavior During Therapy [facility] for tasks assessed/performed                Past Medical History:  Diagnosis Date   Anemia during pregnancy in third trimester 10/09/2022   COVID 03/26/2022   Migraines    Obesity in pregnancy 09/03/2022   Supervision of other normal pregnancy, antepartum 04/22/2022              Clinical Staff    Provider      Office Location     [facility]    Dating     11/21/2022, by Patient Reported         Language     English    Anatomy US     Normal       Flu Vaccine     offer    Genetic Screen     NIPS: Negative      TDaP vaccine      09/03/22    Hgb A1C or   GTT    Early :  Third trimester : 25      Covid    Has booster         LAB RESULTS       Rhogam     B/Positiv   SVD (spontaneous vaginal delivery) 10/07/2022   Trimalleolar fracture 03/2023   left   Past Surgical History:  Procedure Laterality Date   ORIF ANKLE FRACTURE Left 05/04/2023   Procedure: OPEN REDUCTION INTERNAL FIXATION (ORIF) ANKLE FRACTURE;  Surgeon: Candelaria Stagers, DPM;  Location: [facility] ORS;  Service: Orthopedics/Podiatry;  Laterality: Left;  POPLITEAL/SAPHENOUS BLOCK   WRIST SURGERY Left    Patient Active Problem List   Diagnosis Date Noted   Intrauterine device 01/11/2023    PCP: no  PCP  REFERRING PROVIDER: Candelaria Stagers, DPM  REFERRING DIAG: closed fracture of left ankle  THERAPY DIAG:  Pain in left ankle and joints of left foot  Stiffness of left ankle, not elsewhere classified  Muscle weakness (generalized)  Difficulty in walking, not elsewhere classified  Rationale for Evaluation and Treatment: Rehabilitation  ONSET DATE: fracture on 04/22/2023 and surgery 05/04/2023  SUBJECTIVE:   PERTINENT HISTORY: Patient is a 32 y.o. female who presents to outpatient physical therapy with a referral for medical diagnosis closed fracture of left ankle. This patient's chief complaints consist of left ankle pain, stiffness, and dysfunction s/p L ORIF of left ankle fracture with syndesmotic stabilization on 05/04/2023,  leading to the following functional deficits: difficulty with usual activities including basic household and community ambulation, transfers, self-care, stairs, ADL and IADLs, caring for her children, housework, laundry, working, driving without difficulty. Relevant past  medical history and comorbidities include has a past medical history of pelvic floor dysfunction, disabling pelvic pain post partum, Anemia during pregnancy in third trimester (10/09/2022), Migraines, and Trimalleolar fracture (03/2023). Patient denies hx of cancer, stroke, seizures, lung problems, heart problems, diabetes, unexplained weight loss, unexplained changes in bowel or bladder problems, unexplained stumbling or dropping things, osteoporosis, and spinal surgery  SUBJECTIVE STATEMENT: Patient arrived with CAM boot on left LE with no AD but pushing her baby in a stoller. She states her L ankle has been feeling better today. She felt pretty good after last PT session. Her HEP is going well. She has been doing the bridges and hip abductions and using the band when she can. She has not needed her crutches at all since last PT session. She has been using a handrail on the steps at her appointment.  She was able to bring her baby down the stairs today to get to her appointment today. She states her pelvic floor symptoms have been "hit or miss." She had pain in her pelvic floor that she associates with the time after going up steps. She has the pain in her lower abdominal region when she sits down after going up the stairs. She states she had pain there since the 4th or 5th month of pregnancy. She states it got worse and never fixed itself.   PAIN: Are you having pain? Yes NPRS: 1/10 left posterior ankle when she steps down on her left foot.    PRECAUTIONS: fall, weight bearing restrictions (see below), has not been cleared to do exercise due to pelvic floor.   Dr. Allena Katz replied: "nothing specific, just strengthening the leg and getting her to ambulate to being WBAT in boot" when asked if he had a specific protocol (by instant message on 06/02/2023).   WEIGHT BEARING RESTRICTIONS: Yes wean to WBAT in cam boot    PATIENT GOALS: "hoping it will be as close to normalcy as possible" "would love to get back into walking without boot and to slowly start to integrate exercise"  NEXT MD VISIT: 07/08/2023.   OBJECTIVE  Figure 8: L ankle 57 cm (last measured 12/26/204)  TODAY'S TREATMENT:     Therapeutic exercise: to centralize symptoms and improve ROM, strength, muscular endurance, and activity tolerance required for successful completion of functional activities.  - NuStep level 5 using bilateral upper and lower extremities with left CAM boot donned.  Seat/handle setting 11/12. For improved extremity mobility, muscular endurance, and activity tolerance; and to induce the analgesic effect of aerobic exercise, stimulate improved joint nutrition, and prepare body structures and systems for following interventions. x 5 minutes. Average SPM = 73.  - sidelying L hip abduction AROM, 1x20 with boot on.  - sidelying knees to elbow plank, 1x20 seconds (reports pulling/pain in lower abdominal region -  concordant for pelvic floor symptoms during - willing to continue).  - education on anatomy and function of pelvic floor and core, pelvic floor ROM, strengthenin, relaxation, and motor control exercises.  - hooklying breathing with pelvic floor ROM (contraction and relaxation), 2:17 min of practice after learning how to perform.  - seated diaphragmatic breathing with pelvic floor ROM (contraction and relaxation) in steated position with hands on belly. 2:17 minutes of practice but moved to just diaphragmatic breathing due to paradoxical breathing present and difficulty keeping track of the motor control aspect of exercise. - left ankle figure 8 measurement (see above) - Education on HEP including handout   Pt required multimodal cuing  for proper technique and to facilitate improved neuromuscular control, strength, range of motion, and functional ability resulting in improved performance and form.   PATIENT EDUCATION:  Education details: Exercise purpose/form. Self management techniques.  Person educated: Patient Education method: Explanation, Demonstration, Tactile cues, Verbal cues, and Handouts Education comprehension: verbalized understanding, returned demonstration, verbal cues required, tactile cues required, and needs further education  HOME EXERCISE PROGRAM: Access Code: [email or web address] URL: [email or web address] Date: 06/28/2023 Prepared by: Norton Blizzard  Exercises - Standing Weight Shift  - 3 x daily - 2-3 sets - 10 reps - Seated Ankle Alphabet  - 3 x daily - 2-3 sets - Seated Heel Slide  - 3 x daily - 2 sets - 10 reps - 5 seconds hold - Toe Spreading  - 1-2 x daily - 1 sets - 20 reps - 4 seconds hold - Toe Yoga - Alternating Great Toe and Lesser Toe Extension  - 1-2 x daily - 1 sets - 20 reps - 4 seconds hold - Long Sitting Ankle Eversion with Resistance  - 1 x daily - 7 x weekly - 15 reps - Long Sitting Ankle Plantar Flexion with Resistance  - 1 x daily - 7 x weekly - 1  sets - 15 reps - Long Sitting Ankle Inversion with Resistance  - 1 x daily - 7 x weekly - 1 sets - 15 reps - Long Sitting Ankle Dorsiflexion with Anchored Resistance  - 1 x daily - 7 x weekly - 1 sets - 15 reps - Lateral Ankle Scar Massage  - 1 x daily - Sidelying Hip Abduction  - 1 x daily - 3 sets - 10 reps - 2 seconds hold - Bridge  - 1 x daily - 1 sets - 20 reps - 5 seconds hold - Supine Diaphragmatic Breathing  - 2 x daily - 2 sets - 20 reps - Seated Diaphragmatic Breathing  - 2 x daily - 2 sets - 20 reps  ASSESSMENT:  CLINICAL IMPRESSION: Patient arrives with improved gait pattern and confidence with cam boot donned. She was distracted by keeping baby from losing patience during session, which did affect care but was better than missing her visit. Patient limited by lower abdominal pain, which is her concordant pelvic floor pain complaint. This limited her ability to progress hip strengthening exercises. Therefore, time was spent to initiate pelvic floor ROM exercises and diaphragmatic breathing to help patient improve her options with purposefully controlling her intra-abdominal pressure and allowing her to return to functional activities including ankle rehab exercises. She gave good effort but was inconsistent in her ability to coordinate abdominals and pelvic floor with diaphragmatic breathing. Figure 8 measurement at the ankle today was decreased by 0.5 cm which is an improvement. Plan to continue to incorporate pelvic floor directed interventions into treatment sessions as appropriate to allow her to recover most effectively and be able to complete ankle direction exercises when ankle is ready. Plan to keep focus most on ankle next session.Patient would benefit from continued management of limiting condition by skilled physical therapist to address remaining impairments and functional limitations to work towards stated goals and return to PLOF or maximal functional independence.   From  initial PT evaluation on 06/02/2023:  Patient is a 32 y.o. female referred to outpatient physical therapy with a medical diagnosis of closed fracture of left ankle who presents with signs and symptoms consistent with left ankle pain, stiffness, weakness, and dysfunction s/p left open reduction internal fixation of ankle fracture with syndesmotic  stabilization on 05/04/2023 2/2 left ankle fracture from fall on 04/22/2023. Patient presents with significant pain, ROM, joint stiffness, gait, tissue integrity, muscle performance (strength/power/endurance), and activity tolerance impairments that are limiting ability to complete her usual activities including basic household and community ambulation, transfers, self-care, stairs, ADL and IADLs, caring for her children, housework, laundry, working, driving without difficulty. Patient will benefit from skilled physical therapy intervention to address current body structure impairments and activity limitations to improve function and work towards goals set in current POC in order to return to prior level of function or maximal functional improvement.   OBJECTIVE IMPAIRMENTS: Abnormal gait, decreased activity tolerance, decreased balance, decreased coordination, decreased endurance, decreased knowledge of condition, decreased knowledge of use of DME, decreased mobility, difficulty walking, decreased ROM, decreased strength, hypomobility, increased edema, increased fascial restrictions, impaired perceived functional ability, impaired flexibility, impaired sensation, improper body mechanics, obesity, and pain.   ACTIVITY LIMITATIONS: carrying, lifting, bending, sitting, standing, squatting, stairs, transfers, bed mobility, bathing, dressing, reach over head, hygiene/grooming, locomotion level, and caring for others  PARTICIPATION LIMITATIONS: meal prep, cleaning, laundry, interpersonal relationship, driving, shopping, community activity, occupation, and yard  work  PERSONAL FACTORS: Past/current experiences, Transportation, and 1-2 comorbidities: migraines, pelvic floor dysfunction/pain  are also affecting patient's functional outcome.   REHAB POTENTIAL: Good  CLINICAL DECISION MAKING: Stable/uncomplicated  EVALUATION COMPLEXITY: Low   GOALS: Goals reviewed with patient? No  SHORT TERM GOALS: Target date: 06/16/2023  Patient will be independent with initial home exercise program for self-management of symptoms. Baseline: Initial HEP provided at IE (06/02/23); Goal status: In-progress   LONG TERM GOALS: Target date: 08/25/2023  Patient will be independent with a long-term home exercise program for self-management of symptoms.  Baseline: Initial HEP provided at IE (06/02/23); Goal status: In-progress  2.  Patient will demonstrate improved FOTO to equal or greater than 68 by visit #15 to demonstrate improvement in overall condition and self-reported functional ability.  Baseline: 38 (06/02/23); Goal status: In-progress  3.  Patient will demonstrate the ability to ambulate equal or greater than 1300 feet on 6 Minute Walk Test with no gait deviations to improve her ability to complete community mobility.  Baseline: not tested - pt NWB on crutches at initial eval (06/02/23); Goal status: In-progress  4.  Patient will demonstrate L ankle PROM within 90% of right ankle PROM to improve her ability to ambulate normally and complete stairs.  Baseline: Initial AROM measured at left ankle significantly less than right  (06/02/23); Goal status: In-progress  5.  Patient will demonstrate improvement in Patient Specific Functional Scale (PSFS) of equal or greater than 3 points to reflect clinically significant improvement in patient's most valued functional activities. Baseline: to be measured visit #2 as appropriate (06/02/23); 1.33 (06/09/2023);  Goal status: In-progress  6.  Patient will demonstrate the ability to perform 20 single leg heel  raises on L LE to help normalize gait pattern and improve her ability to return to her exercise routine, complete stairs, and care for her children safely and effectively.  Baseline: unable to weight bear on L LE (06/02/23); Goal status: In-progress    PLAN:  PT FREQUENCY: 1-2x/week  PT DURATION: 12 weeks  PLANNED INTERVENTIONS: 97164- PT Re-evaluation, 97110-Therapeutic exercises, 97530- Therapeutic activity, 97112- Neuromuscular re-education, 97535- Self Care, 60454- Manual therapy, [phone]- Gait training, 97014- Electrical stimulation (unattended), Patient/Family education, Balance training, Stair training, Dry Needling, Joint mobilization, Spinal mobilization, Cryotherapy, and Moist heat  PLAN FOR NEXT SESSION: update HEP as appropriate, progressive LE/functional  strengthening/ROM/balance as appropriate. Normalize weight bearing and gait mechanics within physician's and tissue healing limitations. Education.   Luretha Murphy. Ilsa Iha, PT, DPT 06/28/23, 6:36 PM  [facility] [address] [place], [place] [address] P: [phone] I F: [phone]

## 2023-06-29 ENCOUNTER — Encounter: Payer: Self-pay | Admitting: Podiatry

## 2023-07-01 ENCOUNTER — Encounter: Payer: Self-pay | Admitting: Physical Therapy

## 2023-07-01 ENCOUNTER — Ambulatory Visit: Payer: Medicaid Other | Attending: Podiatry | Admitting: Physical Therapy

## 2023-07-01 DIAGNOSIS — R262 Difficulty in walking, not elsewhere classified: Secondary | ICD-10-CM | POA: Diagnosis present

## 2023-07-01 DIAGNOSIS — M25672 Stiffness of left ankle, not elsewhere classified: Secondary | ICD-10-CM | POA: Diagnosis present

## 2023-07-01 DIAGNOSIS — M25572 Pain in left ankle and joints of left foot: Secondary | ICD-10-CM | POA: Diagnosis present

## 2023-07-01 DIAGNOSIS — M6281 Muscle weakness (generalized): Secondary | ICD-10-CM | POA: Insufficient documentation

## 2023-07-01 NOTE — Therapy (Signed)
 OUTPATIENT PHYSICAL THERAPY TREATMENT   Patient Name: Olivia Frost MRN: [id number] DOB:01-04-1991, 33 y.o., female Today's Date: 07/01/2023  END OF SESSION:  PT End of Session - 07/01/23 1304     Visit Number 7    Number of Visits 17    Date for PT Re-Evaluation 08/25/23    Authorization Type [place] [employer] reporting period from 06/02/2023    Authorization Time Period [employer] auth#[id number] 12/9-08/06/23 for 16 PT visits    Authorization - Visit Number 6    Authorization - Number of Visits 16    Progress Note Due on Visit 10    PT Start Time 1304    PT Stop Time 1343    PT Time Calculation (min) 39 min    Activity Tolerance Patient tolerated treatment well    Behavior During Therapy [facility] for tasks assessed/performed                 Past Medical History:  Diagnosis Date   Anemia during pregnancy in third trimester 10/09/2022   COVID 03/26/2022   Migraines    Obesity in pregnancy 09/03/2022   Supervision of other normal pregnancy, antepartum 04/22/2022              Clinical Staff    Provider      Office Location     [facility]    Dating     11/21/2022, by Patient Reported         Language     English    Anatomy US      Normal       Flu Vaccine     offer    Genetic Screen     NIPS: Negative      TDaP vaccine      09/03/22    Hgb A1C or   GTT    Early :  Third trimester : 75      Covid    Has booster         LAB RESULTS       Rhogam     B/Positiv   SVD (spontaneous vaginal delivery) 10/07/2022   Trimalleolar fracture 03/2023   left   Past Surgical History:  Procedure Laterality Date   ORIF ANKLE FRACTURE Left 05/04/2023   Procedure: OPEN REDUCTION INTERNAL FIXATION (ORIF) ANKLE FRACTURE;  Surgeon: Tobie Franky SQUIBB, DPM;  Location: [facility] ORS;  Service: Orthopedics/Podiatry;  Laterality: Left;  POPLITEAL/SAPHENOUS BLOCK   WRIST SURGERY Left    Patient Active Problem List   Diagnosis Date Noted   Intrauterine device 01/11/2023    PCP: no PCP  REFERRING  PROVIDER: Tobie Franky SQUIBB, DPM  REFERRING DIAG: closed fracture of left ankle  THERAPY DIAG:  Pain in left ankle and joints of left foot  Stiffness of left ankle, not elsewhere classified  Muscle weakness (generalized)  Difficulty in walking, not elsewhere classified  Rationale for Evaluation and Treatment: Rehabilitation  ONSET DATE: fracture on 04/22/2023 and surgery 05/04/2023  SUBJECTIVE:   PERTINENT HISTORY: Patient is a 33 y.o. female who presents to outpatient physical therapy with a referral for medical diagnosis closed fracture of left ankle. This patient's chief complaints consist of left ankle pain, stiffness, and dysfunction s/p L ORIF of left ankle fracture with syndesmotic stabilization on 05/04/2023,  leading to the following functional deficits: difficulty with usual activities including basic household and community ambulation, transfers, self-care, stairs, ADL and IADLs, caring for her children, housework, laundry, working, driving without difficulty. Relevant past medical history  and comorbidities include has a past medical history of pelvic floor dysfunction, disabling pelvic pain post partum, Anemia during pregnancy in third trimester (10/09/2022), Migraines, and Trimalleolar fracture (03/2023). Patient denies hx of cancer, stroke, seizures, lung problems, heart problems, diabetes, unexplained weight loss, unexplained changes in bowel or bladder problems, unexplained stumbling or dropping things, osteoporosis, and spinal surgery  SUBJECTIVE STATEMENT: Patient arrived with CAM boot on left LE with no AD. She states her pelvic pain has been bothering her today. She states it continues to occur when she does steps. This morning she had to move really carefully to turn over in bed and could not turn in bed without increased pelvic pain. She states her HEP went well. She has been doing the sidelying plank for about 20 seconds at a time and the clam shells. She has been using the  band on her ankle. She did ankle slides. She tried the breathing, but she does not thing she is doing it right. She states her ankle is not as swollen as it has been.   PAIN: Are you having pain? Yes NPRS: 1/10 left posterior ankle when she steps down on her left foot. 4/10 at lower abdomen.    PRECAUTIONS: fall, weight bearing restrictions (see below), has not been cleared to do exercise due to pelvic floor.   Dr. Tobie replied: nothing specific, just strengthening the leg and getting her to ambulate to being WBAT in boot when asked if he had a specific protocol (by instant message on 06/02/2023).   WEIGHT BEARING RESTRICTIONS: Yes wean to WBAT in cam boot    PATIENT GOALS: hoping it will be as close to normalcy as possible would love to get back into walking without boot and to slowly start to integrate exercise  NEXT MD VISIT: 07/08/2023.   OBJECTIVE  Figure 8: L ankle 58 cm (last measured 12/26/204)  TODAY'S TREATMENT:     Therapeutic exercise: to centralize symptoms and improve ROM, strength, muscular endurance, and activity tolerance required for successful completion of functional activities.  Boot doffed: - NuStep level 5 using bilateral upper and lower extremities with left CAM boot donned.  Seat/handle setting 11/12. For improved extremity mobility, muscular endurance, and activity tolerance; and to induce the analgesic effect of aerobic exercise, stimulate improved joint nutrition, and prepare body structures and systems for following interventions. x 6 minutes. Average SPM = 63. Boot doffed.  - left ankle figure 8 measurement (see above)  Boot donned:  - seated diaphragmatic breathing, 3 min plus additional reps to review it, hands on belly for feedback.  - supine diaphragmatic breathing with pelvic floor contraction/relaxation, 3 min plus additional reps to review it, hands on belly for feedback.  - quadruped diaphragmatic breathing with pelvic floor  contraction/relaxation, 1 min plus additional reps to learn activity.  - Supine Straight Leg Raise with Pelvic Floor Contraction/relaxation with hands on belly and breath, 1x10 each side.  - Supine Bridge with Knee Extension and Pelvic Floor Contraction/relaxation with hands on belly and breath, 1x10 each side (R knee flexed with performing left glute lower due to pelvic pain when knee extended). Noted to be weaker and more prone to pelvic pain with left glute load.  - sidelying L hip abduction AROM with pelvic floor contraction, 1x5 with boot on.  - knees to chest and happy baby position with breathing to relax pelvic floor and maintain good hip ROM, 1x~2 min.  - Education on HEP including handout   Pt required multimodal cuing  for proper technique and to facilitate improved neuromuscular control, strength, range of motion, and functional ability resulting in improved performance and form.   PATIENT EDUCATION:  Education details: Exercise purpose/form. Self management techniques.  Person educated: Patient Education method: Explanation, Demonstration, Tactile cues, Verbal cues, and Handouts Education comprehension: verbalized understanding, returned demonstration, verbal cues required, tactile cues required, and needs further education  HOME EXERCISE PROGRAM: Access Code: [email or web address] URL: [email or web address] Date: 07/01/2023 Prepared by: Camie Cleverly  Exercises - Standing Weight Shift  - 3 x daily - 2-3 sets - 10 reps - Seated Ankle Alphabet  - 3 x daily - 2-3 sets - Seated Heel Slide  - 3 x daily - 2 sets - 10 reps - 5 seconds hold - Toe Spreading  - 1-2 x daily - 1 sets - 20 reps - 4 seconds hold - Toe Yoga - Alternating Great Toe and Lesser Toe Extension  - 1-2 x daily - 1 sets - 20 reps - 4 seconds hold - Long Sitting Ankle Eversion with Resistance  - 1 x daily - 7 x weekly - 15 reps - Long Sitting Ankle Plantar Flexion with Resistance  - 1 x daily - 7 x weekly - 1 sets -  15 reps - Long Sitting Ankle Inversion with Resistance  - 1 x daily - 7 x weekly - 1 sets - 15 reps - Long Sitting Ankle Dorsiflexion with Anchored Resistance  - 1 x daily - 7 x weekly - 1 sets - 15 reps - Lateral Ankle Scar Massage  - 1 x daily - Seated Diaphragmatic Breathing  - 2 x daily - 2 sets - 20 reps - Supine Diaphragmatic Breathing  - 1-2 x daily - 2 sets - 20 reps - Supine Straight Leg Raise with Pelvic Floor Contraction  - 1-2 x daily - 1-2 sets - 10 reps - Supine Bridge with Knee Extension and Pelvic Floor Contraction  - 1-2 x daily - 2 sets - 10 reps - Sidelying Pelvic Floor Contraction with Hip Abduction  - 1-2 x daily - 2 sets - 15 reps - Happy Baby with Pelvic Floor Lengthening   ASSESSMENT:  CLINICAL IMPRESSION: Patient arrives with continued improvement in gait pattern and weight bearing/activity tolerance for the left ankle. It continues to be objectively swollen with figure 8 measurement 1 cm more than last visit, but subjectively it does not feel as swollen. Continued working on improving tolerance to ankle ROM with patient tolerating nustep out of the boot today. Continued work on pelvic floor and breathing coordination to prepare her to be able to complete functional standing exercises required for ankle rehab more effectively. She was able to progress bridge to single leg bridge during eccentric phase. She was significantly weaker on the left side compared to the right and she requires continued strengthening in this region to return to PLOF. She demonstrated good carry over for breathing and abdominal/pelvic floor activation techniques. Patient would benefit from continued management of limiting condition by skilled physical therapist to address remaining impairments and functional limitations to work towards stated goals and return to PLOF or maximal functional independence.    From initial PT evaluation on 06/02/2023:  Patient is a 33 y.o. female referred to outpatient  physical therapy with a medical diagnosis of closed fracture of left ankle who presents with signs and symptoms consistent with left ankle pain, stiffness, weakness, and dysfunction s/p left open reduction internal fixation of ankle fracture with syndesmotic stabilization on 05/04/2023 2/2 left ankle  fracture from fall on 04/22/2023. Patient presents with significant pain, ROM, joint stiffness, gait, tissue integrity, muscle performance (strength/power/endurance), and activity tolerance impairments that are limiting ability to complete her usual activities including basic household and community ambulation, transfers, self-care, stairs, ADL and IADLs, caring for her children, housework, laundry, working, driving without difficulty. Patient will benefit from skilled physical therapy intervention to address current body structure impairments and activity limitations to improve function and work towards goals set in current POC in order to return to prior level of function or maximal functional improvement.   OBJECTIVE IMPAIRMENTS: Abnormal gait, decreased activity tolerance, decreased balance, decreased coordination, decreased endurance, decreased knowledge of condition, decreased knowledge of use of DME, decreased mobility, difficulty walking, decreased ROM, decreased strength, hypomobility, increased edema, increased fascial restrictions, impaired perceived functional ability, impaired flexibility, impaired sensation, improper body mechanics, obesity, and pain.   ACTIVITY LIMITATIONS: carrying, lifting, bending, sitting, standing, squatting, stairs, transfers, bed mobility, bathing, dressing, reach over head, hygiene/grooming, locomotion level, and caring for others  PARTICIPATION LIMITATIONS: meal prep, cleaning, laundry, interpersonal relationship, driving, shopping, community activity, occupation, and yard work  PERSONAL FACTORS: Past/current experiences, Transportation, and 1-2 comorbidities: migraines,  pelvic floor dysfunction/pain  are also affecting patient's functional outcome.   REHAB POTENTIAL: Good  CLINICAL DECISION MAKING: Stable/uncomplicated  EVALUATION COMPLEXITY: Low   GOALS: Goals reviewed with patient? No  SHORT TERM GOALS: Target date: 06/16/2023  Patient will be independent with initial home exercise program for self-management of symptoms. Baseline: Initial HEP provided at IE (06/02/23); Goal status: In-progress   LONG TERM GOALS: Target date: 08/25/2023  Patient will be independent with a long-term home exercise program for self-management of symptoms.  Baseline: Initial HEP provided at IE (06/02/23); Goal status: In-progress  2.  Patient will demonstrate improved FOTO to equal or greater than 68 by visit #15 to demonstrate improvement in overall condition and self-reported functional ability.  Baseline: 38 (06/02/23); Goal status: In-progress  3.  Patient will demonstrate the ability to ambulate equal or greater than 1300 feet on 6 Minute Walk Test with no gait deviations to improve her ability to complete community mobility.  Baseline: not tested - pt NWB on crutches at initial eval (06/02/23); Goal status: In-progress  4.  Patient will demonstrate L ankle PROM within 90% of right ankle PROM to improve her ability to ambulate normally and complete stairs.  Baseline: Initial AROM measured at left ankle significantly less than right  (06/02/23); Goal status: In-progress  5.  Patient will demonstrate improvement in Patient Specific Functional Scale (PSFS) of equal or greater than 3 points to reflect clinically significant improvement in patient's most valued functional activities. Baseline: to be measured visit #2 as appropriate (06/02/23); 1.33 (06/09/2023);  Goal status: In-progress  6.  Patient will demonstrate the ability to perform 20 single leg heel raises on L LE to help normalize gait pattern and improve her ability to return to her exercise routine,  complete stairs, and care for her children safely and effectively.  Baseline: unable to weight bear on L LE (06/02/23); Goal status: In-progress    PLAN:  PT FREQUENCY: 1-2x/week  PT DURATION: 12 weeks  PLANNED INTERVENTIONS: 97164- PT Re-evaluation, 97110-Therapeutic exercises, 97530- Therapeutic activity, 97112- Neuromuscular re-education, 97535- Self Care, 02859- Manual therapy, [phone]- Gait training, 97014- Electrical stimulation (unattended), Patient/Family education, Balance training, Stair training, Dry Needling, Joint mobilization, Spinal mobilization, Cryotherapy, and Moist heat  PLAN FOR NEXT SESSION: update HEP as appropriate, progressive LE/functional strengthening/ROM/balance as appropriate. Normalize weight bearing  and gait mechanics within physician's and tissue healing limitations. Education.   Camie SAUNDERS. Juli, PT, DPT 07/01/23, 2:26 PM  [facility] [address] [place], [place] [address] P: [phone] I F: [phone]

## 2023-07-02 ENCOUNTER — Telehealth: Payer: Self-pay | Admitting: Podiatry

## 2023-07-02 NOTE — Telephone Encounter (Signed)
 Patient left a message for Dr. Tobie -- requesting a letter or note stating she can return to work  --  next appt with Tobie is 07/08/2023.  Called patient back and advised that Dr. Tobie would have to release her but he is out of town until 07/08/2023 .SABRA...    She is trying to sign up for unemployment and has a form to be completed - will e-mail.  Patient also said her PT sessions have been good -- is off her crutches and just using boot.  Patient will try Tobie later next week.     J. Abbott -- 07/02/2023

## 2023-07-02 NOTE — Telephone Encounter (Signed)
 Patient came in to office today. Pt had surgery. Pt needs a note. Pt states she got fired from her job so she is trying to collect unemployment. She needs the note to say what her that she can work and what her restrictions are. Pt is post op.

## 2023-07-06 ENCOUNTER — Ambulatory Visit: Payer: Medicaid Other | Admitting: Physical Therapy

## 2023-07-08 ENCOUNTER — Ambulatory Visit (INDEPENDENT_AMBULATORY_CARE_PROVIDER_SITE_OTHER): Payer: Medicaid Other

## 2023-07-08 ENCOUNTER — Ambulatory Visit: Payer: Medicaid Other | Admitting: Physical Therapy

## 2023-07-08 ENCOUNTER — Encounter: Payer: Self-pay | Admitting: Podiatry

## 2023-07-08 ENCOUNTER — Ambulatory Visit (INDEPENDENT_AMBULATORY_CARE_PROVIDER_SITE_OTHER): Payer: Medicaid Other | Admitting: Podiatry

## 2023-07-08 DIAGNOSIS — Z9889 Other specified postprocedural states: Secondary | ICD-10-CM

## 2023-07-08 DIAGNOSIS — S82892A Other fracture of left lower leg, initial encounter for closed fracture: Secondary | ICD-10-CM

## 2023-07-08 NOTE — Progress Notes (Signed)
 Subjective:  Patient ID: Olivia Frost, female    DOB: May 05, 1991,  MRN: [id number]  Chief Complaint  Patient presents with   Routine Post Op    POV #3 DOS 05/04/2023 LT ORIF OF ANKLE W/SYNDESMOSIS It's doing good.    DOS: 05/04/2023 Procedure: Left open reduction internal fixation of ankle fracture with syndesmotic stabilization  33 y.o. female returns for post-op check.  Patient states that she is doing well.  Minimal pain.  She is she is doing well.  Physical therapy is doing good.  She is ambulating with regular shoes Review of Systems: Negative except as noted in the HPI. Denies N/V/F/Ch.  Past Medical History:  Diagnosis Date   Anemia during pregnancy in third trimester 10/09/2022   COVID 03/26/2022   Migraines    Obesity in pregnancy 09/03/2022   Supervision of other normal pregnancy, antepartum 04/22/2022              Clinical Staff    Provider      Office Location     [facility]    Dating     11/21/2022, by Patient Reported         Language     English    Anatomy US      Normal       Flu Vaccine     offer    Genetic Screen     NIPS: Negative      TDaP vaccine      09/03/22    Hgb A1C or   GTT    Early :  Third trimester : 81      Covid    Has booster         LAB RESULTS       Rhogam     B/Positiv   SVD (spontaneous vaginal delivery) 10/07/2022   Trimalleolar fracture 03/2023   left    Current Outpatient Medications:    acetaminophen  (TYLENOL ) 500 MG tablet, Take 1,000 mg by mouth every 8 (eight) hours as needed (pain.)., Disp: , Rfl:    cyclobenzaprine  (FLEXERIL ) 10 MG tablet, Take 1 tablet (10 mg total) by mouth 3 (three) times daily as needed for muscle spasms. (Patient not taking: Reported on 07/08/2023), Disp: 30 tablet, Rfl: 0   ibuprofen  (ADVIL ) 800 MG tablet, Take 800 mg by mouth every 8 (eight) hours as needed (pain.). (Patient not taking: Reported on 07/08/2023), Disp: , Rfl:    ibuprofen  (ADVIL ) 800 MG tablet, Take 1 tablet (800 mg total) by mouth every 6 (six)  hours as needed. (Patient not taking: Reported on 07/08/2023), Disp: 60 tablet, Rfl: 1   oxyCODONE  (ROXICODONE ) 5 MG immediate release tablet, Take 1 tablet (5 mg total) by mouth every 8 (eight) hours as needed. (Patient not taking: Reported on 07/08/2023), Disp: 10 tablet, Rfl: 0   oxyCODONE -acetaminophen  (PERCOCET) 5-325 MG tablet, Take 1 tablet by mouth every 4 (four) hours as needed for severe pain (pain score 7-10). (Patient not taking: Reported on 07/08/2023), Disp: 30 tablet, Rfl: 0  Social History   Tobacco Use  Smoking Status Never  Smokeless Tobacco Never    Allergies  Allergen Reactions   Latex Rash   Objective:  There were no vitals filed for this visit. There is no height or weight on file to calculate BMI. Constitutional Well developed. Well nourished.  Vascular Foot warm and well perfused. Capillary refill normal to all digits.   Neurologic Normal speech. Oriented to person, place, and time. Epicritic sensation to light  touch grossly present bilaterally.  Dermatologic Skin completely epithelialized.  No signs of dehiscence noted no complication noted.  Good range of motion noted of the ankle joint.  Orthopedic: No further tenderness to palpation noted about the surgical site.   Radiographs: 3 views of skeletally mature adult left foot: Hardware is intact no signs of backing out or loosening noted.  Ankle mortise within normal limits.  No gapping noted. Assessment:   1. Status post foot surgery   2. Closed fracture of left ankle, initial encounter    Plan:  Patient was evaluated and treated and all questions answered.  S/p foot surgery left -Clinically healed and officially discharged from my care.  She will finish out her physical therapy sessions at this time she has returned to regular shoes running without any restrictions.  If any foot and ankle issues are future she will come back and see me. No follow-ups on file.

## 2023-07-13 ENCOUNTER — Encounter: Payer: Self-pay | Admitting: Physical Therapy

## 2023-07-13 ENCOUNTER — Ambulatory Visit: Payer: Medicaid Other | Admitting: Physical Therapy

## 2023-07-13 DIAGNOSIS — M25572 Pain in left ankle and joints of left foot: Secondary | ICD-10-CM | POA: Diagnosis not present

## 2023-07-13 DIAGNOSIS — R262 Difficulty in walking, not elsewhere classified: Secondary | ICD-10-CM

## 2023-07-13 DIAGNOSIS — M6281 Muscle weakness (generalized): Secondary | ICD-10-CM

## 2023-07-13 DIAGNOSIS — M25672 Stiffness of left ankle, not elsewhere classified: Secondary | ICD-10-CM

## 2023-07-13 NOTE — Therapy (Addendum)
 OUTPATIENT PHYSICAL THERAPY TREATMENT   Patient Name: Olivia Frost MRN: [id number] DOB:Aug 19, 1990, 33 y.o., female Today's Date: 07/14/2023  END OF SESSION:  PT End of Session - 07/13/23 1718     Visit Number 8    Number of Visits 17    Date for PT Re-Evaluation 08/25/23    Authorization Type  [employer] reporting period from 06/02/2023    Authorization Time Period [employer] auth#[id number] 12/9-08/06/23 for 16 PT visits    Authorization - Visit Number 7    Authorization - Number of Visits 16    Progress Note Due on Visit 10    PT Start Time 1400    PT Stop Time 1433    PT Time Calculation (min) 33 min    Activity Tolerance Patient tolerated treatment well    Behavior During Therapy [facility] for tasks assessed/performed                  Past Medical History:  Diagnosis Date   Anemia during pregnancy in third trimester 10/09/2022   COVID 03/26/2022   Migraines    Obesity in pregnancy 09/03/2022   Supervision of other normal pregnancy, antepartum 04/22/2022              Clinical Staff    Provider      Office Location     [facility]    Dating     11/21/2022, by Patient Reported         Language     English    Anatomy US      Normal       Flu Vaccine     offer    Genetic Screen     NIPS: Negative      TDaP vaccine      09/03/22    Hgb A1C or   GTT    Early :  Third trimester : 78      Covid    Has booster         LAB RESULTS       Rhogam     B/Positiv   SVD (spontaneous vaginal delivery) 10/07/2022   Trimalleolar fracture 03/2023   left   Past Surgical History:  Procedure Laterality Date   ORIF ANKLE FRACTURE Left 05/04/2023   Procedure: OPEN REDUCTION INTERNAL FIXATION (ORIF) ANKLE FRACTURE;  Surgeon: Tobie Franky SQUIBB, DPM;  Location: [facility] ORS;  Service: Orthopedics/Podiatry;  Laterality: Left;  POPLITEAL/SAPHENOUS BLOCK   WRIST SURGERY Left    Patient Active Problem List   Diagnosis Date Noted   Intrauterine device 01/11/2023    PCP: no PCP  REFERRING  PROVIDER: Tobie Franky SQUIBB, DPM  REFERRING DIAG: closed fracture of left ankle  THERAPY DIAG:  Pain in left ankle and joints of left foot  Stiffness of left ankle, not elsewhere classified  Muscle weakness (generalized)  Difficulty in walking, not elsewhere classified  Rationale for Evaluation and Treatment: Rehabilitation  ONSET DATE: fracture on 04/22/2023 and surgery 05/04/2023  SUBJECTIVE:   PERTINENT HISTORY: Patient is a 33 y.o. female who presents to outpatient physical therapy with a referral for medical diagnosis closed fracture of left ankle. This patient's chief complaints consist of left ankle pain, stiffness, and dysfunction s/p L ORIF of left ankle fracture with syndesmotic stabilization on 05/04/2023,  leading to the following functional deficits: difficulty with usual activities including basic household and community ambulation, transfers, self-care, stairs, ADL and IADLs, caring for her children, housework, laundry, working, driving without difficulty. Relevant past medical  history and comorbidities include has a past medical history of pelvic floor dysfunction, disabling pelvic pain post partum, Anemia during pregnancy in third trimester (10/09/2022), Migraines, and Trimalleolar fracture (03/2023). Patient denies hx of cancer, stroke, seizures, lung problems, heart problems, diabetes, unexplained weight loss, unexplained changes in bowel or bladder problems, unexplained stumbling or dropping things, osteoporosis, and spinal surgery  SUBJECTIVE STATEMENT: Patient arrives to PT session wearing regular shoes. She reports she stopped wearing the CAM boot on her left LE on Thursday (07/08/2023). She states she is feeling okay today, that it feels odd wearing two shoes, but she is adjusting to being out of the boot and has not experienced any pain. She is still noticing some swelling and reports feeling tightness in her posterior ankle/heel. She states feeling more stiffness in the  morning, especially with the colder temperatures lately. She states her HEP has been going well and she is feeling improvement with less tenderness when performing exercises. She had a follow-up visit with Dr. Tobie on 07/08/2023 and was cleared to return to activities as tolerated without any restrictions.    PAIN: Are you having pain? No NPRS: 0/10 currently. Stiffness/tightness but no pain in the left posterior ankle.    PRECAUTIONS:  Precautions regarding ankle have been discharge by pediatrist with instructions to do what she can tolerate. Per patient has not been cleared to do exercise due to pelvic floor.   Dr. Tobie stated in discharge note: She will finish out her physical therapy sessions at this time she has returned to regular shoes running without any restrictions.  (07/08/2023).    PATIENT GOALS: hoping it will be as close to normalcy as possible would love to get back into walking without boot and to slowly start to integrate exercise  NEXT MD VISIT: No follow-ups on file.   OBJECTIVE  PERIPHERAL JOINT MOTION (in degrees)  PASSIVE RANGE OF MOTION (PROM) *Indicates pain 06/02/23 07/13/23 Date  Joint/Motion R/L R/L R/L  Ankle/Foot        Dorsiflexion (knee ext) / 30/18 /  Dorsiflexion (knee flex) / 32/16 /  Plantarflexion / / /  Great toe extension 90/95 / /  Great toe flexion / / /  Comments:  07/13/2023: Ankle DF AAROM Measurement in [facility] with Inclinometer:    TODAY'S TREATMENT:     Therapeutic exercise: to centralize symptoms and improve ROM, strength, muscular endurance, and activity tolerance required for successful completion of functional activities.   - NuStep level 5 using bilateral upper and lower extremities. Seat/handle setting 11/12. For improved extremity mobility, muscular endurance, and activity tolerance; and to induce the analgesic effect of aerobic exercise, stimulate improved joint nutrition, and prepare body structures and systems for following  interventions. x 5 minutes. Average SPM = 70.   - Single Leg Stance 3x30 seconds   UE support available   - Bilat Heel Raises 1x20   UE support available  - Standing Gastroc Stretch at plinth 1x30 seconds  - Standing Soleus Stretch at plinth 1x30 seconds  Therapeutic activities: dynamic activities for functional strengthening and improved functional activity tolerance.   - Goblet Squats to green chair holding 46 month old baby over right shoulder 1x5  Discontinued exercise due to improper form. Plan to review exercise and provide additional cuing for proper form next session.  Pt required multimodal cuing for proper technique and to facilitate improved neuromuscular control, strength, range of motion, and functional ability resulting in improved performance and form.   PATIENT EDUCATION:  Education  details: Exercise purpose/form. Self management techniques.  Person educated: Patient Education method: Explanation, Demonstration, Tactile cues, Verbal cues, and Handouts Education comprehension: verbalized understanding, returned demonstration, verbal cues required, tactile cues required, and needs further education  HOME EXERCISE PROGRAM: Access Code: [email or web address] URL: [email or web address] Date: 07/13/2023 Prepared by: Taitum Alms  Exercises - Single Leg Stance  - 1 x daily - 3 sets - 30 secoonds hold - Heel Raises with Counter Support  - 1 x daily - 2-3 sets - 20 reps - Standing Gastroc Stretch at Counter  - 1 x daily - 3 reps - 30 seconds hold - Standing Soleus Stretch at Counter  - 1 x daily - 3 reps - 30 hold - Long Sitting Ankle Eversion with Resistance  - 1 x daily - 15 reps - Long Sitting Ankle Inversion with Resistance  - 1 x daily - 1 sets - 15 reps - Seated Diaphragmatic Breathing  - 2 x daily - 2 sets - 20 reps - Supine Diaphragmatic Breathing  - 1-2 x daily - 2 sets - 20 reps - Supine Straight Leg Raise with Pelvic Floor Contraction  - 1-2 x daily - 1-2  sets - 10 reps - Supine Bridge with Knee Extension and Pelvic Floor Contraction  - 1-2 x daily - 2 sets - 10 reps - Sidelying Pelvic Floor Contraction with Hip Abduction  - 1-2 x daily - 2 sets - 15 reps - Happy Baby with Pelvic Floor Lengthening  - Squat with Chair Touch  - 3 x weekly - 3 sets - 10 reps   ASSESSMENT:  CLINICAL IMPRESSION: Patient arrives with continued improvement in gait pattern and weight bearing/activity tolerance for the left ankle. She was cleared by her podiatrist to return to activities as tolerated without any restrictions. The focus of today's session was to assess baseline ankle DF ROM measurements and progress interventions for functional strengthening and improving range of motion. Patient received an updated HEP including single leg stance, bilateral heel raises, standing gastroc stretch, and standing soleus stretch. She tolerated exercises well and reported feeling a stretch through her anterior talocrural joint and posterior ankle/heel but no pain. Patient was having difficulty attaining proper form during the goblet squat while holding her 49 month old child over her right shoulder. Her weight was shifted over her right side and her left knee was falling inward during squatting motion. The exercise was discontinued due to improper form and plan to review exercise and provide additional multimodal cuing for proper form and technique next session. Patient would benefit from continued management of limiting condition by skilled physical therapist to address remaining impairments and functional limitations to work towards stated goals and return to PLOF or maximal functional independence.   From initial PT evaluation on 06/02/2023:  Patient is a 33 y.o. female referred to outpatient physical therapy with a medical diagnosis of closed fracture of left ankle who presents with signs and symptoms consistent with left ankle pain, stiffness, weakness, and dysfunction s/p left open  reduction internal fixation of ankle fracture with syndesmotic stabilization on 05/04/2023 2/2 left ankle fracture from fall on 04/22/2023. Patient presents with significant pain, ROM, joint stiffness, gait, tissue integrity, muscle performance (strength/power/endurance), and activity tolerance impairments that are limiting ability to complete her usual activities including basic household and community ambulation, transfers, self-care, stairs, ADL and IADLs, caring for her children, housework, laundry, working, driving without difficulty. Patient will benefit from skilled physical therapy intervention to address current body structure impairments and activity  limitations to improve function and work towards goals set in current POC in order to return to prior level of function or maximal functional improvement.   OBJECTIVE IMPAIRMENTS: Abnormal gait, decreased activity tolerance, decreased balance, decreased coordination, decreased endurance, decreased knowledge of condition, decreased knowledge of use of DME, decreased mobility, difficulty walking, decreased ROM, decreased strength, hypomobility, increased edema, increased fascial restrictions, impaired perceived functional ability, impaired flexibility, impaired sensation, improper body mechanics, obesity, and pain.   ACTIVITY LIMITATIONS: carrying, lifting, bending, sitting, standing, squatting, stairs, transfers, bed mobility, bathing, dressing, reach over head, hygiene/grooming, locomotion level, and caring for others  PARTICIPATION LIMITATIONS: meal prep, cleaning, laundry, interpersonal relationship, driving, shopping, community activity, occupation, and yard work  PERSONAL FACTORS: Past/current experiences, Transportation, and 1-2 comorbidities: migraines, pelvic floor dysfunction/pain  are also affecting patient's functional outcome.   REHAB POTENTIAL: Good  CLINICAL DECISION MAKING: Stable/uncomplicated  EVALUATION COMPLEXITY:  Low   GOALS: Goals reviewed with patient? No  SHORT TERM GOALS: Target date: 06/16/2023  Patient will be independent with initial home exercise program for self-management of symptoms. Baseline: Initial HEP provided at IE (06/02/23); Goal status: In-progress   LONG TERM GOALS: Target date: 08/25/2023  Patient will be independent with a long-term home exercise program for self-management of symptoms.  Baseline: Initial HEP provided at IE (06/02/23); Goal status: In-progress  2.  Patient will demonstrate improved FOTO to equal or greater than 68 by visit #15 to demonstrate improvement in overall condition and self-reported functional ability.  Baseline: 38 (06/02/23); Goal status: In-progress  3.  Patient will demonstrate the ability to ambulate equal or greater than 1300 feet on 6 Minute Walk Test with no gait deviations to improve her ability to complete community mobility.  Baseline: not tested - pt NWB on crutches at initial eval (06/02/23); Goal status: In-progress  4.  Patient will demonstrate L ankle PROM within 90% of right ankle PROM to improve her ability to ambulate normally and complete stairs.  Baseline: Initial AROM measured at left ankle significantly less than right  (06/02/23); Goal status: In-progress  5.  Patient will demonstrate improvement in Patient Specific Functional Scale (PSFS) of equal or greater than 3 points to reflect clinically significant improvement in patient's most valued functional activities. Baseline: to be measured visit #2 as appropriate (06/02/23); 1.33 (06/09/2023);  Goal status: In-progress  6.  Patient will demonstrate the ability to perform 20 single leg heel raises on L LE to help normalize gait pattern and improve her ability to return to her exercise routine, complete stairs, and care for her children safely and effectively.  Baseline: unable to weight bear on L LE (06/02/23); Goal status: In-progress    PLAN:  PT FREQUENCY:  1-2x/week  PT DURATION: 12 weeks  PLANNED INTERVENTIONS: 97164- PT Re-evaluation, 97110-Therapeutic exercises, 97530- Therapeutic activity, 97112- Neuromuscular re-education, 97535- Self Care, [id number]- Manual therapy, [phone]- Gait training, 97014- Electrical stimulation (unattended), Patient/Family education, Balance training, Stair training, Dry Needling, Joint mobilization, Spinal mobilization, Cryotherapy, and Moist heat  PLAN FOR NEXT SESSION: update HEP as appropriate, progressive LE/functional strengthening/ROM/balance as appropriate. Normalize weight bearing and gait mechanics within physician's and tissue healing limitations. Education.    Rael Yo, SPT [employer] DPTE   Camie R. Juli, PT, DPT 07/14/23, 8:23 PM  [facility] [address] [place], [place] [address] P: [phone] I F: [phone]

## 2023-07-15 ENCOUNTER — Telehealth: Payer: Self-pay | Admitting: Physical Therapy

## 2023-07-15 ENCOUNTER — Encounter: Payer: Self-pay | Admitting: Physical Therapy

## 2023-07-15 ENCOUNTER — Ambulatory Visit: Payer: Medicaid Other | Admitting: Physical Therapy

## 2023-07-15 DIAGNOSIS — M6281 Muscle weakness (generalized): Secondary | ICD-10-CM

## 2023-07-15 DIAGNOSIS — M25672 Stiffness of left ankle, not elsewhere classified: Secondary | ICD-10-CM

## 2023-07-15 DIAGNOSIS — R262 Difficulty in walking, not elsewhere classified: Secondary | ICD-10-CM

## 2023-07-15 DIAGNOSIS — M25572 Pain in left ankle and joints of left foot: Secondary | ICD-10-CM

## 2023-07-15 NOTE — Therapy (Signed)
OUTPATIENT PHYSICAL THERAPY TREATMENT   Patient Name: Olivia Frost MRN: [id number] DOB:14-Nov-1990, 33 y.o., female Today's Date: 07/15/2023  END OF SESSION:  PT End of Session - 07/15/23 1848     Visit Number 9    Number of Visits 17    Date for PT Re-Evaluation 08/25/23    Authorization Type [place] [employer]ICA[employer] reporting period from 06/02/2023    Authorization Time Period [employer] auth#[id number] 12/9-08/06/23 for 16 PT visits    Authorization - Visit Number 8    Authorization - Number of Visits 16    Progress Note Due on Visit 10    PT Start Time 1729    PT Stop Time 1817    PT Time Calculation (min) 48 min    Activity Tolerance Patient tolerated treatment well    Behavior During Therapy [facility] for tasks assessed/performed                   Past Medical History:  Diagnosis Date   Anemia during pregnancy in third trimester 10/09/2022   COVID 03/26/2022   Migraines    Obesity in pregnancy 09/03/2022   Supervision of other normal pregnancy, antepartum 04/22/2022              Clinical Staff    Provider      Office Location     [facility]    Dating     11/21/2022, by Patient Reported         Language     English    Anatomy US     Normal       Flu Vaccine     offer    Genetic Screen     NIPS: Negative      TDaP vaccine      09/03/22    Hgb A1C or   GTT    Early :  Third trimester : 40      Covid    Has booster         LAB RESULTS       Rhogam     B/Positiv   SVD (spontaneous vaginal delivery) 10/07/2022   Trimalleolar fracture 03/2023   left   Past Surgical History:  Procedure Laterality Date   ORIF ANKLE FRACTURE Left 05/04/2023   Procedure: OPEN REDUCTION INTERNAL FIXATION (ORIF) ANKLE FRACTURE;  Surgeon: Candelaria Stagers, DPM;  Location: [facility] ORS;  Service: Orthopedics/Podiatry;  Laterality: Left;  POPLITEAL/SAPHENOUS BLOCK   WRIST SURGERY Left    Patient Active Problem List   Diagnosis Date Noted   Intrauterine device 01/11/2023    PCP: no PCP  REFERRING  PROVIDER: Candelaria Stagers, DPM  REFERRING DIAG: closed fracture of left ankle  THERAPY DIAG:  Pain in left ankle and joints of left foot  Stiffness of left ankle, not elsewhere classified  Muscle weakness (generalized)  Difficulty in walking, not elsewhere classified  Rationale for Evaluation and Treatment: Rehabilitation  ONSET DATE: fracture on 04/22/2023 and surgery 05/04/2023  SUBJECTIVE:   PERTINENT HISTORY: Patient is a 33 y.o. female who presents to outpatient physical therapy with a referral for medical diagnosis closed fracture of left ankle. This patient's chief complaints consist of left ankle pain, stiffness, and dysfunction s/p L ORIF of left ankle fracture with syndesmotic stabilization on 05/04/2023,  leading to the following functional deficits: difficulty with usual activities including basic household and community ambulation, transfers, self-care, stairs, ADL and IADLs, caring for her children, housework, laundry, working, driving without difficulty. Relevant past  medical history and comorbidities include has a past medical history of pelvic floor dysfunction, disabling pelvic pain post partum, Anemia during pregnancy in third trimester (10/09/2022), Migraines, and Trimalleolar fracture (03/2023). Patient denies hx of cancer, stroke, seizures, lung problems, heart problems, diabetes, unexplained weight loss, unexplained changes in bowel or bladder problems, unexplained stumbling or dropping things, osteoporosis, and spinal surgery  SUBJECTIVE STATEMENT: Patient arrives to session after having a busy few days with birthday yesterday, running errands, and planning a birthday trip to [place] this weekend. She reports her ankle feeling stiff/sore today from a significant amount of walking today, but is not experiencing any pain. She states she did fewer exercises yesterday, but has been doing her exercises at home since her last session and they have been feeling good. Patient  reports her left ankle still feels swollen on her posterior ankle (close to her heel).    PAIN: Are you having pain? No NPRS: 0/10 currently. Stiffness/soreness but no pain in the left posterior ankle.    PRECAUTIONS:  Precautions regarding ankle have been discharge by pediatrist with instructions to do what she can tolerate. Per patient has not been cleared to do exercise due to pelvic floor.   Dr. Allena Katz stated in discharge note: "She will finish out her physical therapy sessions at this time she has returned to regular shoes running without any restrictions."  (07/08/2023).    PATIENT GOALS: "hoping it will be as close to normalcy as possible" "would love to get back into walking without boot and to slowly start to integrate exercise"  NEXT MD VISIT: No follow-ups on file.   OBJECTIVE   TODAY'S TREATMENT:     Therapeutic exercise: to centralize symptoms and improve ROM, strength, muscular endurance, and activity tolerance required for successful completion of functional activities.   - NuStep level 5 using bilateral upper and lower extremities. Seat/handle setting 11/12. For improved extremity mobility, muscular endurance, and activity tolerance; and to induce the analgesic effect of aerobic exercise, stimulate improved joint nutrition, and prepare body structures and systems for following interventions. x 6 minutes. Average SPM = 71.   - Single Leg Stance on airex pad 3x30 seconds   UE support available, moderate use    - Bilat Heel Raises with forefoot on Step 2x20   UE support available  - Lateral Step Down on 6 inch step, 1x20   UE support available   Felt more of a stretch and soreness, but no pain   - Standing Gastroc Stretch at treadmill 1x30 seconds  - Standing Soleus Stretch at treadmill 1x30 seconds  Therapeutic activities: dynamic activities for functional strengthening and improved functional activity tolerance.   - Squats with heels elevated on 2x4 plank,  2x20 Feels stretch in anterior talocrural joint and posterior ankle/heel but no pain  Pt required multimodal cuing for proper technique and to facilitate improved neuromuscular control, strength, range of motion, and functional ability resulting in improved performance and form.  PATIENT EDUCATION:  Education details: Exercise purpose/form. Self management techniques.  Person educated: Patient Education method: Explanation, Demonstration, Tactile cues, Verbal cues, and Handouts Education comprehension: verbalized understanding, returned demonstration, verbal cues required, tactile cues required, and needs further education  HOME EXERCISE PROGRAM: Access Code: [email or web address] URL: [email or web address] Date: 07/15/2023 Prepared by: Shail Urbas [place]  Exercises - Single Leg Stance  - 1 x daily - 3 sets - 30 secoonds hold - Heel Raises with Counter Support  - 1 x daily - 2-3 sets - 20 reps - Standing Gastroc  Stretch at Counter  - 1 x daily - 3 reps - 30 seconds hold - Standing Soleus Stretch at Counter  - 1 x daily - 3 reps - 30 hold - Long Sitting Ankle Eversion with Resistance  - 1 x daily - 15 reps - Long Sitting Ankle Inversion with Resistance  - 1 x daily - 1 sets - 15 reps - Seated Diaphragmatic Breathing  - 2 x daily - 2 sets - 20 reps - Supine Diaphragmatic Breathing  - 1-2 x daily - 2 sets - 20 reps - Supine Straight Leg Raise with Pelvic Floor Contraction  - 1-2 x daily - 1-2 sets - 10 reps - Supine Bridge with Knee Extension and Pelvic Floor Contraction  - 1-2 x daily - 2 sets - 10 reps - Sidelying Pelvic Floor Contraction with Hip Abduction  - 1-2 x daily - 2 sets - 15 reps - Happy Baby with Pelvic Floor Lengthening  - Squat with Chair Touch  - 3 x weekly - 3 sets - 10 reps - Heel Raise on Step  - 3 x weekly - 2 sets - 20 reps - Lateral Step Down  - 3 x weekly - 2 sets - 20 reps  ASSESSMENT:  CLINICAL IMPRESSION: Patient arrives to session with soreness and stiffness  after a full day of activity, but no pain. Patient has continued to have good participation in her HEP and has show improvement in weight bearing activity tolerance for her left ankle. The focus of today's session was to continue progressing interventions as tolerated for functional strengthening of the lower extremities, balance, and improving ankle range of motion. The single leg stance exercise was progressed to standing on the airex pad. Bilateral heel raises were progressed to be performed with her forefoot on a 6 inch step to allow for greater ankle range of motion. Squatting was attempted again this session with her heels elevated on a 2x4 plank. Education was provided to patient on how to perform this exercise at home with a towel roll under heels. Lateral step downs on the 6 inch step were added to improve functional strengthening of the hip/ankle stabilizers and to promote ankle dorsiflexion and weight bearing on the left lower extremity. The form of her squat had significantly improved since last session with her weight being more evenly distributed between both lower extremities and the left knee was no longer falling inward. Patient tolerated new exercises well, reported a stretching sensation in her anterior talocrural joint and posterior ankle, but did not experience an increase in pain. She reports muscle soreness at the end of session and 0/10 pain. Patient received an updated HEP including new exercises. Patient would benefit from continued management of limiting condition by skilled physical therapist to address remaining impairments and functional limitations to work towards stated goals and return to PLOF or maximal functional independence.     From initial PT evaluation on 06/02/2023:  Patient is a 33 y.o. female referred to outpatient physical therapy with a medical diagnosis of closed fracture of left ankle who presents with signs and symptoms consistent with left ankle pain, stiffness,  weakness, and dysfunction s/p left open reduction internal fixation of ankle fracture with syndesmotic stabilization on 05/04/2023 2/2 left ankle fracture from fall on 04/22/2023. Patient presents with significant pain, ROM, joint stiffness, gait, tissue integrity, muscle performance (strength/power/endurance), and activity tolerance impairments that are limiting ability to complete her usual activities including basic household and community ambulation, transfers, self-care, stairs, ADL and  IADLs, caring for her children, housework, laundry, working, driving without difficulty. Patient will benefit from skilled physical therapy intervention to address current body structure impairments and activity limitations to improve function and work towards goals set in current POC in order to return to prior level of function or maximal functional improvement.   OBJECTIVE IMPAIRMENTS: Abnormal gait, decreased activity tolerance, decreased balance, decreased coordination, decreased endurance, decreased knowledge of condition, decreased knowledge of use of DME, decreased mobility, difficulty walking, decreased ROM, decreased strength, hypomobility, increased edema, increased fascial restrictions, impaired perceived functional ability, impaired flexibility, impaired sensation, improper body mechanics, obesity, and pain.   ACTIVITY LIMITATIONS: carrying, lifting, bending, sitting, standing, squatting, stairs, transfers, bed mobility, bathing, dressing, reach over head, hygiene/grooming, locomotion level, and caring for others  PARTICIPATION LIMITATIONS: meal prep, cleaning, laundry, interpersonal relationship, driving, shopping, community activity, occupation, and yard work  PERSONAL FACTORS: Past/current experiences, Transportation, and 1-2 comorbidities: migraines, pelvic floor dysfunction/pain  are also affecting patient's functional outcome.   REHAB POTENTIAL: Good  CLINICAL DECISION MAKING:  Stable/uncomplicated  EVALUATION COMPLEXITY: Low   GOALS: Goals reviewed with patient? No  SHORT TERM GOALS: Target date: 06/16/2023  Patient will be independent with initial home exercise program for self-management of symptoms. Baseline: Initial HEP provided at IE (06/02/23); Goal status: In-progress   LONG TERM GOALS: Target date: 08/25/2023  Patient will be independent with a long-term home exercise program for self-management of symptoms.  Baseline: Initial HEP provided at IE (06/02/23); Goal status: In-progress  2.  Patient will demonstrate improved FOTO to equal or greater than 68 by visit #15 to demonstrate improvement in overall condition and self-reported functional ability.  Baseline: 38 (06/02/23); Goal status: In-progress  3.  Patient will demonstrate the ability to ambulate equal or greater than 1300 feet on 6 Minute Walk Test with no gait deviations to improve her ability to complete community mobility.  Baseline: not tested - pt NWB on crutches at initial eval (06/02/23); Goal status: In-progress  4.  Patient will demonstrate L ankle PROM within 90% of right ankle PROM to improve her ability to ambulate normally and complete stairs.  Baseline: Initial AROM measured at left ankle significantly less than right  (06/02/23); Goal status: In-progress  5.  Patient will demonstrate improvement in Patient Specific Functional Scale (PSFS) of equal or greater than 3 points to reflect clinically significant improvement in patient's most valued functional activities. Baseline: to be measured visit #2 as appropriate (06/02/23); 1.33 (06/09/2023);  Goal status: In-progress  6.  Patient will demonstrate the ability to perform 20 single leg heel raises on L LE to help normalize gait pattern and improve her ability to return to her exercise routine, complete stairs, and care for her children safely and effectively.  Baseline: unable to weight bear on L LE (06/02/23); Goal status:  In-progress    PLAN:  PT FREQUENCY: 1-2x/week  PT DURATION: 12 weeks  PLANNED INTERVENTIONS: 97164- PT Re-evaluation, 97110-Therapeutic exercises, 97530- Therapeutic activity, 97112- Neuromuscular re-education, 97535- Self Care, 78295- Manual therapy, [phone]- Gait training, 97014- Electrical stimulation (unattended), Patient/Family education, Balance training, Stair training, Dry Needling, Joint mobilization, Spinal mobilization, Cryotherapy, and Moist heat  PLAN FOR NEXT SESSION: update HEP as appropriate, progressive LE/functional strengthening/ROM/balance as appropriate. Normalize weight bearing and gait mechanics within physician's and tissue healing limitations. Education.    Haider Hornaday [place], SPT [employer] DPTE   Huntley Dec R. Ilsa Iha, PT, DPT 07/15/23, 7:56 PM  [facility] [address] [place], [place]  [address] P: [phone] I F: [phone]

## 2023-07-15 NOTE — Telephone Encounter (Signed)
Spoke with patient notifying her of missed PT visit scheduled at 1:45 pm today. She said she picked her older daughter up for school, ran errands, and forgot about her appointment. A later time was offered at 5:30pm and she asked to take that time slot. She said she would call if she could not make the appointment.   Let patient know that with any no-show I am required to review our cancellation policy that after 2 no-shows we cannot schedule more than 1 week at a time and/or we may remove a patient from the schedule.   Adaline Trejos [place], SPT [facility] DPTE   [facility] [address] [place], [place] [address] P: [phone] I F: [phone]

## 2023-07-19 ENCOUNTER — Telehealth: Payer: Self-pay | Admitting: Podiatry

## 2023-07-19 NOTE — Telephone Encounter (Signed)
Completed STD paperwork from [employer] for the patient ....   Faxed paperwork and doctors letter to [phone] ....   Called patient and advised the same.  Doctors (07/08/23) letter releases patient to return to work full duty with no restrictions.      J. Abbott -- 07/19/2023

## 2023-07-20 ENCOUNTER — Encounter: Payer: Self-pay | Admitting: Physical Therapy

## 2023-07-20 ENCOUNTER — Ambulatory Visit: Payer: Medicaid Other | Admitting: Physical Therapy

## 2023-07-20 DIAGNOSIS — M25572 Pain in left ankle and joints of left foot: Secondary | ICD-10-CM

## 2023-07-20 DIAGNOSIS — R262 Difficulty in walking, not elsewhere classified: Secondary | ICD-10-CM

## 2023-07-20 DIAGNOSIS — M25672 Stiffness of left ankle, not elsewhere classified: Secondary | ICD-10-CM

## 2023-07-20 DIAGNOSIS — M6281 Muscle weakness (generalized): Secondary | ICD-10-CM

## 2023-07-20 NOTE — Therapy (Signed)
OUTPATIENT PHYSICAL THERAPY TREATMENT / PROGRESS NOTE Dates of Reporting from 06/01/2024 to 07/20/2023   Patient Name: Olivia Frost MRN: [id number] DOB:1990/09/02, 33 y.o., female Today's Date: 07/20/2023  END OF SESSION:  PT End of Session - 07/20/23 1713     Visit Number 10    Number of Visits 17    Date for PT Re-Evaluation 08/25/23    Authorization Type [place] [employer] reporting period from 06/02/2023    Authorization Time Period [employer] auth#[id number] 12/9-08/06/23 for 16 PT visits    Authorization - Visit Number 9    Authorization - Number of Visits 16    Progress Note Due on Visit 10    PT Start Time 1436    PT Stop Time 1520    PT Time Calculation (min) 44 min    Activity Tolerance Patient tolerated treatment well    Behavior During Therapy [facility] for tasks assessed/performed                    Past Medical History:  Diagnosis Date   Anemia during pregnancy in third trimester 10/09/2022   COVID 03/26/2022   Migraines    Obesity in pregnancy 09/03/2022   Supervision of other normal pregnancy, antepartum 04/22/2022              Clinical Staff    Provider      Office Location     [facility]    Dating     11/21/2022, by Patient Reported         Language     English    Anatomy US     Normal       Flu Vaccine     offer    Genetic Screen     NIPS: Negative      TDaP vaccine      09/03/22    Hgb A1C or   GTT    Early :  Third trimester : 23      Covid    Has booster         LAB RESULTS       Rhogam     B/Positiv   SVD (spontaneous vaginal delivery) 10/07/2022   Trimalleolar fracture 03/2023   left   Past Surgical History:  Procedure Laterality Date   ORIF ANKLE FRACTURE Left 05/04/2023   Procedure: OPEN REDUCTION INTERNAL FIXATION (ORIF) ANKLE FRACTURE;  Surgeon: Candelaria Stagers, DPM;  Location: [facility] ORS;  Service: Orthopedics/Podiatry;  Laterality: Left;  POPLITEAL/SAPHENOUS BLOCK   WRIST SURGERY Left    Patient Active Problem List   Diagnosis Date  Noted   Intrauterine device 01/11/2023    PCP: no PCP  REFERRING PROVIDER: Candelaria Stagers, DPM  REFERRING DIAG: closed fracture of left ankle  THERAPY DIAG:  Pain in left ankle and joints of left foot  Stiffness of left ankle, not elsewhere classified  Muscle weakness (generalized)  Difficulty in walking, not elsewhere classified  Rationale for Evaluation and Treatment: Rehabilitation  ONSET DATE: fracture on 04/22/2023 and surgery 05/04/2023  SUBJECTIVE:   PERTINENT HISTORY: Patient is a 33 y.o. female who presents to outpatient physical therapy with a referral for medical diagnosis closed fracture of left ankle. This patient's chief complaints consist of left ankle pain, stiffness, and dysfunction s/p L ORIF of left ankle fracture with syndesmotic stabilization on 05/04/2023,  leading to the following functional deficits: difficulty with usual activities including basic household and community ambulation, transfers, self-care, stairs, ADL and IADLs, caring  for her children, housework, laundry, working, driving without difficulty. Relevant past medical history and comorbidities include has a past medical history of pelvic floor dysfunction, disabling pelvic pain post partum, Anemia during pregnancy in third trimester (10/09/2022), Migraines, and Trimalleolar fracture (03/2023). Patient denies hx of cancer, stroke, seizures, lung problems, heart problems, diabetes, unexplained weight loss, unexplained changes in bowel or bladder problems, unexplained stumbling or dropping things, osteoporosis, and spinal surgery  SUBJECTIVE STATEMENT: Patient arrives to session feeling good with stiffness/soreness and mild swelling in her left posterior ankle but no pain. She reports she was able to do some dancing over the weekend while on her birthday trip resulting in a slight increase in soreness, but no increase in pain. Patient states she did her HEP on Sunday after returning from her trip and all  exercises have been feeling good.   PAIN: Are you having pain? No NPRS: 0/10 currently. Stiffness/soreness but no pain in the left posterior ankle.    PRECAUTIONS:  Precautions regarding ankle have been discharge by pediatrist with instructions to do what she can tolerate. Per patient has not been cleared to do exercise due to pelvic floor.   Dr. Allena Katz stated in discharge note: "She will finish out her physical therapy sessions at this time she has returned to regular shoes running without any restrictions."  (07/08/2023).    PATIENT GOALS: "hoping it will be as close to normalcy as possible" "would love to get back into walking without boot and to slowly start to integrate exercise"  NEXT MD VISIT: No follow-ups on file.   OBJECTIVE SELF-REPORTED FUNCTION Patient Specific Functional Scale (PSFS)  Walking regularly: 8 Jog/run/exercise: 3 Dance: 7 Average: 6  FOTO score:  Baseline (06/02/2023): 38/100 (ankle questionnaire)  07/20/2023:  69/100 (ankle questionnaire)   6-MINUTE WALK TEST 1066 feet  Patient completed test while pushing a stroller.   PERIPHERAL JOINT MOTION (in degrees)  PASSIVE RANGE OF MOTION (PROM) *Indicates pain 06/02/23 07/13/23 07/20/23  Joint/Motion R/L R/L R/L  Ankle/Foot        Dorsiflexion (knee ext) / 30/18 26/16  Dorsiflexion (knee flex) / 32/16 32/12  Plantarflexion / / /  Randie Heinz toe extension 90/95 / /  Great toe flexion / / /  Comments:  Ankle DF AAROM Measurement in CKC with Inclinometer  MUSCLE PERFORMANCE (MMT):  Ankle Plantarflexion (Single Leg Heel Raise): 5 reps  Able to raise heel approximately 1 inch off the ground. Required significant amount of UE support. She reports feeling restricted in the anterior talocrural joint.     TODAY'S TREATMENT:     Therapeutic exercise: to centralize symptoms and improve ROM, strength, muscular endurance, and activity tolerance required for successful completion of functional activities.   - 6-Minute  Walk Test (see above)   - Single Leg Heel Raises (see above) - left leg only  - Lateral Step Down on 6 inch step, 1x20   UE support available   Felt more of a stretch and soreness, but no pain    - Single Leg Stance on airex pad 1x30 seconds   UE support available, moderate use    - Standing Ankle Dorsiflexion Stretch on Chair   5 second hold x 20 reps   - Standing Anterior Tibialis Stretch  5 second hold x 20 reps    - Education on HEP including handout     Pt required multimodal cuing for proper technique and to facilitate improved neuromuscular control, strength, range of motion, and functional ability resulting in improved  performance and form.  PATIENT EDUCATION:  Education details: Exercise purpose/form. Self management techniques.  Person educated: Patient Education method: Explanation, Demonstration, Tactile cues, Verbal cues, and Handouts Education comprehension: verbalized understanding, returned demonstration, verbal cues required, tactile cues required, and needs further education  HOME EXERCISE PROGRAM: Access Code: [email or web address] URL: [email or web address] Date: 07/20/2023 Prepared by: Rynell Ciotti [place]  Exercises - Single Leg Stance  - 1 x daily - 3 sets - 30 secoonds hold - Heel Raises with Counter Support  - 1 x daily - 2-3 sets - 20 reps - Standing Gastroc Stretch at Counter  - 1 x daily - 3 reps - 30 seconds hold - Standing Soleus Stretch at Counter  - 1 x daily - 3 reps - 30 hold - Long Sitting Ankle Eversion with Resistance  - 1 x daily - 15 reps - Long Sitting Ankle Inversion with Resistance  - 1 x daily - 1 sets - 15 reps - Seated Diaphragmatic Breathing  - 2 x daily - 2 sets - 20 reps - Supine Diaphragmatic Breathing  - 1-2 x daily - 2 sets - 20 reps - Supine Straight Leg Raise with Pelvic Floor Contraction  - 1-2 x daily - 1-2 sets - 10 reps - Supine Bridge with Knee Extension and Pelvic Floor Contraction  - 1-2 x daily - 2 sets - 10 reps -  Sidelying Pelvic Floor Contraction with Hip Abduction  - 1-2 x daily - 2 sets - 15 reps - Happy Baby with Pelvic Floor Lengthening  - Squat with Chair Touch  - 3 x weekly - 3 sets - 10 reps - Heel Raise on Step  - 3 x weekly - 2 sets - 20 reps - Lateral Step Down  - 3 x weekly - 2 sets - 20 reps - Standing Ankle Dorsiflexion Stretch on Chair  - 2-3 x daily - 1 sets - 20 reps - 5 hold - Standing Anterior Tibialis Stretch  - 2-3 x daily  ASSESSMENT:  CLINICAL IMPRESSION: Patient has attended 10 physical therapy sessions since starting her current episode of care on 06/02/2023. Patient referred to outpatient physical therapy following surgical repair of a closed fracture to the left ankle. The focus of current treatment sessions includes exercises for improving ankle range of motion, lower extremity strength/stability/endurance, and activity tolerance. Patient reports feeling improvement in her ability to ambulate and complete community mobility, climb stairs, and returning to valued functional activities. Patient demonstrates improvement in left ankle pain, activity tolerance, strength, ankle range of motion, and participation in HEP for self-management of symptoms. She has met two out of six long term goals and has made significant progress towards meeting the remaining goals. Patient continues to present with ankle range of motion and strength impairments that are limiting her ability to ambulate for long distances, return to her exercise routine, and complete activities such as jogging/running/dancing without discomfort. Patient would like to return to walking regularly, jogging, and dancing without difficulty or soreness in the left ankle. Patient would benefit from continued management of limiting condition by skilled physical therapist to address remaining impairments and functional limitations to work towards stated goals and return to PLOF or maximal functional independence.    From initial PT  evaluation on 06/02/2023:  Patient is a 33 y.o. female referred to outpatient physical therapy with a medical diagnosis of closed fracture of left ankle who presents with signs and symptoms consistent with left ankle pain, stiffness, weakness, and dysfunction s/p left open reduction internal fixation  of ankle fracture with syndesmotic stabilization on 05/04/2023 2/2 left ankle fracture from fall on 04/22/2023. Patient presents with significant pain, ROM, joint stiffness, gait, tissue integrity, muscle performance (strength/power/endurance), and activity tolerance impairments that are limiting ability to complete her usual activities including basic household and community ambulation, transfers, self-care, stairs, ADL and IADLs, caring for her children, housework, laundry, working, driving without difficulty. Patient will benefit from skilled physical therapy intervention to address current body structure impairments and activity limitations to improve function and work towards goals set in current POC in order to return to prior level of function or maximal functional improvement.   OBJECTIVE IMPAIRMENTS: Abnormal gait, decreased activity tolerance, decreased balance, decreased coordination, decreased endurance, decreased knowledge of condition, decreased knowledge of use of DME, decreased mobility, difficulty walking, decreased ROM, decreased strength, hypomobility, increased edema, increased fascial restrictions, impaired perceived functional ability, impaired flexibility, impaired sensation, improper body mechanics, obesity, and pain.   ACTIVITY LIMITATIONS: carrying, lifting, bending, sitting, standing, squatting, stairs, transfers, bed mobility, bathing, dressing, reach over head, hygiene/grooming, locomotion level, and caring for others  PARTICIPATION LIMITATIONS: meal prep, cleaning, laundry, interpersonal relationship, driving, shopping, community activity, occupation, and yard work  PERSONAL FACTORS:  Past/current experiences, Transportation, and 1-2 comorbidities: migraines, pelvic floor dysfunction/pain  are also affecting patient's functional outcome.   REHAB POTENTIAL: Good  CLINICAL DECISION MAKING: Stable/uncomplicated  EVALUATION COMPLEXITY: Low   GOALS: Goals reviewed with patient? No  SHORT TERM GOALS: Target date: 06/16/2023  Patient will be independent with initial home exercise program for self-management of symptoms. Baseline: Initial HEP provided at IE (06/02/23); Currently participating well in HEP (07/20/23); Goal status: In-progress   LONG TERM GOALS: Target date: 08/25/2023  Patient will be independent with a long-term home exercise program for self-management of symptoms.  Baseline: Initial HEP provided at IE (06/02/23); Currently participating well in HEP (07/20/23); Goal status: In-progress  2.  Patient will demonstrate improved FOTO to equal or greater than 68 by visit #15 to demonstrate improvement in overall condition and self-reported functional ability.  Baseline: 38 (06/02/23); 69 (07/20/23); Goal status: Met  3.  Patient will demonstrate the ability to ambulate equal or greater than 1300 feet on 6 Minute Walk Test with no gait deviations to improve her ability to complete community mobility.  Baseline: not tested - pt NWB on crutches at initial eval (06/02/23); 1066 feet while pushing stroller to comfort baby (07/20/23); Goal status: In-progress  4.  Patient will demonstrate L ankle PROM within 90% of right ankle PROM to improve her ability to ambulate normally and complete stairs.  Baseline: Initial AROM measured at left ankle significantly less than right  (06/02/23); limited compared to R  - see objective (07/20/2023);  Goal status: In-progress  5.  Patient will demonstrate improvement in Patient Specific Functional Scale (PSFS) of equal or greater than 3 points to reflect clinically significant improvement in patient's most valued functional  activities. Baseline: to be measured visit #2 as appropriate (06/02/23); 1.33 (06/09/2023); 6 (07/20/23); Goal status: Met  6.  Patient will demonstrate the ability to perform 20 single leg heel raises on L LE to help normalize gait pattern and improve her ability to return to her exercise routine, complete stairs, and care for her children safely and effectively.  Baseline: unable to weight bear on L LE (06/02/23); able to perform 5 single leg heel raises on L LE (07/20/23); Goal status: In-progress    PLAN:  PT FREQUENCY: 1-2x/week  PT DURATION: 12 weeks  PLANNED INTERVENTIONS:  16109- PT Re-evaluation, 97110-Therapeutic exercises, 97530- Therapeutic activity, [id number]- Neuromuscular re-education, 97535- Self Care, 60454- Manual therapy, [phone]- Gait training, [phone]- Electrical stimulation (unattended), Patient/Family education, Balance training, Stair training, Dry Needling, Joint mobilization, Spinal mobilization, Cryotherapy, and Moist heat  PLAN FOR NEXT SESSION: update HEP as appropriate, progressive LE/functional strengthening/ROM/balance as appropriate. Normalize weight bearing and gait mechanics within physician's and tissue healing limitations. Education.    Parrish Bonn [place], SPT [employer] DPTE   Huntley Dec R. Ilsa Iha, PT, DPT 07/20/23, 5:16 PM  [facility] [address] [place], [place] [address] P: [phone] I F: [phone]

## 2023-07-22 ENCOUNTER — Encounter: Payer: Self-pay | Admitting: Physical Therapy

## 2023-07-22 ENCOUNTER — Ambulatory Visit: Payer: Medicaid Other | Admitting: Physical Therapy

## 2023-07-22 DIAGNOSIS — M25672 Stiffness of left ankle, not elsewhere classified: Secondary | ICD-10-CM

## 2023-07-22 DIAGNOSIS — M25572 Pain in left ankle and joints of left foot: Secondary | ICD-10-CM

## 2023-07-22 DIAGNOSIS — M6281 Muscle weakness (generalized): Secondary | ICD-10-CM

## 2023-07-22 DIAGNOSIS — R262 Difficulty in walking, not elsewhere classified: Secondary | ICD-10-CM

## 2023-07-22 NOTE — Therapy (Signed)
OUTPATIENT PHYSICAL THERAPY TREATMENT   Patient Name: Olivia Frost MRN: [id number] DOB:02/12/1991, 33 y.o., female Today's Date: 07/23/2023  END OF SESSION:  PT End of Session - 07/22/23 1724     Visit Number 11    Number of Visits 17    Date for PT Re-Evaluation 08/25/23    Authorization Type [place] [employer]ICA[employer] reporting period from 06/02/2023    Authorization Time Period [employer] auth#[id number] 12/9-08/06/23 for 16 PT visits    Authorization - Visit Number 10    Authorization - Number of Visits 16    Progress Note Due on Visit 10    PT Start Time 1524    PT Stop Time 1601    PT Time Calculation (min) 37 min    Activity Tolerance Patient tolerated treatment well    Behavior During Therapy [facility] for tasks assessed/performed                     Past Medical History:  Diagnosis Date   Anemia during pregnancy in third trimester 10/09/2022   COVID 03/26/2022   Migraines    Obesity in pregnancy 09/03/2022   Supervision of other normal pregnancy, antepartum 04/22/2022              Clinical Staff    Provider      Office Location     [facility]    Dating     11/21/2022, by Patient Reported         Language     English    Anatomy US     Normal       Flu Vaccine     offer    Genetic Screen     NIPS: Negative      TDaP vaccine      09/03/22    Hgb A1C or   GTT    Early :  Third trimester : 54      Covid    Has booster         LAB RESULTS       Rhogam     B/Positiv   SVD (spontaneous vaginal delivery) 10/07/2022   Trimalleolar fracture 03/2023   left   Past Surgical History:  Procedure Laterality Date   ORIF ANKLE FRACTURE Left 05/04/2023   Procedure: OPEN REDUCTION INTERNAL FIXATION (ORIF) ANKLE FRACTURE;  Surgeon: Candelaria Stagers, DPM;  Location: [facility] ORS;  Service: Orthopedics/Podiatry;  Laterality: Left;  POPLITEAL/SAPHENOUS BLOCK   WRIST SURGERY Left    Patient Active Problem List   Diagnosis Date Noted   Intrauterine device 01/11/2023    PCP: no  PCP  REFERRING PROVIDER: Candelaria Stagers, DPM  REFERRING DIAG: closed fracture of left ankle  THERAPY DIAG:  Pain in left ankle and joints of left foot  Stiffness of left ankle, not elsewhere classified  Muscle weakness (generalized)  Difficulty in walking, not elsewhere classified  Rationale for Evaluation and Treatment: Rehabilitation  ONSET DATE: fracture on 04/22/2023 and surgery 05/04/2023  SUBJECTIVE:   PERTINENT HISTORY: Patient is a 33 y.o. female who presents to outpatient physical therapy with a referral for medical diagnosis closed fracture of left ankle. This patient's chief complaints consist of left ankle pain, stiffness, and dysfunction s/p L ORIF of left ankle fracture with syndesmotic stabilization on 05/04/2023,  leading to the following functional deficits: difficulty with usual activities including basic household and community ambulation, transfers, self-care, stairs, ADL and IADLs, caring for her children, housework, laundry, working, driving without difficulty.  Relevant past medical history and comorbidities include has a past medical history of pelvic floor dysfunction, disabling pelvic pain post partum, Anemia during pregnancy in third trimester (10/09/2022), Migraines, and Trimalleolar fracture (03/2023). Patient denies hx of cancer, stroke, seizures, lung problems, heart problems, diabetes, unexplained weight loss, unexplained changes in bowel or bladder problems, unexplained stumbling or dropping things, osteoporosis, and spinal surgery  SUBJECTIVE STATEMENT: Patient arrives to session with both daughters present after school being cancelled today. She reports feeling continued soreness in posterior ankle and tightness in anterior ankle, but is feeling good overall. Patient states she has been doing her new stretches at home and has still been feeling tightness but there has been improvement since last session.   PAIN: Are you having pain? No NPRS: 0/10  currently. Stiffness/soreness but no pain in the left posterior ankle.    PRECAUTIONS:  Precautions regarding ankle have been discharge by pediatrist with instructions to do what she can tolerate. Per patient has not been cleared to do exercise due to pelvic floor.   Dr. Allena Katz stated in discharge note: "She will finish out her physical therapy sessions at this time she has returned to regular shoes running without any restrictions."  (07/08/2023).    PATIENT GOALS: "hoping it will be as close to normalcy as possible" "would love to get back into walking without boot and to slowly start to integrate exercise"  NEXT MD VISIT: No follow-ups on file.    OBJECTIVE    TODAY'S TREATMENT:     Therapeutic exercise: to centralize symptoms and improve ROM, strength, muscular endurance, and activity tolerance required for successful completion of functional activities.   - NuStep level 6 using bilateral upper and lower extremities. Seat/handle setting 11/12. For improved extremity mobility, muscular endurance, and activity tolerance; and to induce the analgesic effect of aerobic exercise, stimulate improved joint nutrition, and prepare body structures and systems for following interventions. x 6 minutes. Average SPM = 71.   - Runners step up on 12 inch step   UE support available (moderate use)  1x20 each leg   - L Single leg heel raise with right leg on step  1x5  Discontinued due to improper form and discomfort in left ankle  - Eccentric heel raises (lowering on left leg)   1x15  Modified to have minimal weight through right LE instead of being fully weight bearing on Left LE  - Education on HEP including handout   Neuromuscular Re-education: to improve, balance, postural strength, muscle activation patterns, and stabilization strength required for functional activities:     - Squats on BOSU (ball side down)     1x15    - Side stepping on airex beam (forefoot on beam)     1x10   Pt  required multimodal cuing for proper technique and to facilitate improved neuromuscular control, strength, range of motion, and functional ability resulting in improved performance and form.    PATIENT EDUCATION:  Education details: Exercise purpose/form. Self management techniques.  Person educated: Patient Education method: Explanation, Demonstration, Tactile cues, Verbal cues, and Handouts Education comprehension: verbalized understanding, returned demonstration, verbal cues required, tactile cues required, and needs further education  HOME EXERCISE PROGRAM: Access Code: [email or web address] URL: [email or web address] Date: 07/22/2023 Prepared by: Levonia Wolfley [place]  Exercises - Single Leg Stance  - 1 x daily - 3 sets - 30 secoonds hold - Heel Raises with Counter Support  - 1 x daily - 2-3 sets - 20 reps - Standing Gastroc Stretch at [employer]  -  1 x daily - 3 reps - 30 seconds hold - Standing Soleus Stretch at Counter  - 1 x daily - 3 reps - 30 hold - Long Sitting Ankle Eversion with Resistance  - 1 x daily - 15 reps - Long Sitting Ankle Inversion with Resistance  - 1 x daily - 1 sets - 15 reps - Seated Diaphragmatic Breathing  - 2 x daily - 2 sets - 20 reps - Supine Diaphragmatic Breathing  - 1-2 x daily - 2 sets - 20 reps - Supine Straight Leg Raise with Pelvic Floor Contraction  - 1-2 x daily - 1-2 sets - 10 reps - Supine Bridge with Knee Extension and Pelvic Floor Contraction  - 1-2 x daily - 2 sets - 10 reps - Sidelying Pelvic Floor Contraction with Hip Abduction  - 1-2 x daily - 2 sets - 15 reps - Happy Baby with Pelvic Floor Lengthening  - Squat with Chair Touch  - 3 x weekly - 3 sets - 10 reps - Heel Raise on Step  - 3 x weekly - 2 sets - 20 reps - Lateral Step Down  - 3 x weekly - 2 sets - 20 reps - Standing Ankle Dorsiflexion Stretch on Chair  - 2-3 x daily - 1 sets - 20 reps - 5 hold - Standing Anterior Tibialis Stretch  - 2-3 x daily - Standing Eccentric Heel Raise  - 1 x  daily - 1 sets - 20 reps - Runner's Step Up/Down  - 1 x daily - 2 sets - 20 reps  ASSESSMENT:  CLINICAL IMPRESSION: Patient arrives to session feeling slight improvement in ankle stiffness since last session and continued good participation in HEP. The focus of today's session was to continue progressing exercises to improve ankle range of motion, lower extremity strength/endurance, and challenge balance to improve functional activity tolerance and weight bearing on the left lower extremity. Single leg heel raises with right foot elevated on step was discontinued due to patient apprehension/discomfort during exercise and improper form. Patient was able to tolerate eccentric heel raises better once modified to maintain minimal weight bearing through her right lower extremity instead of being fully weight bearing on only left lower extremity. Patient tolerated interventions well and was able to complete all other exercises with minimal to no lasting increase in discomfort. Patient required multimodal cuing for proper technique and to facilitate neuromuscular control, strength, range of motion, and functional ability resulting in improved performance and form. Patient reports feeling good at end of session with less stiffness in left ankle and no increase in pain. Patient was provided a handout with update HEP including well tolerated exercises. Patient would benefit from continued management of limiting condition by skilled physical therapist to address remaining impairments and functional limitations to work towards stated goals and return to PLOF or maximal functional independence.    From initial PT evaluation on 06/02/2023:  Patient is a 33 y.o. female referred to outpatient physical therapy with a medical diagnosis of closed fracture of left ankle who presents with signs and symptoms consistent with left ankle pain, stiffness, weakness, and dysfunction s/p left open reduction internal fixation of ankle  fracture with syndesmotic stabilization on 05/04/2023 2/2 left ankle fracture from fall on 04/22/2023. Patient presents with significant pain, ROM, joint stiffness, gait, tissue integrity, muscle performance (strength/power/endurance), and activity tolerance impairments that are limiting ability to complete her usual activities including basic household and community ambulation, transfers, self-care, stairs, ADL and IADLs, caring for her  children, housework, laundry, working, driving without difficulty. Patient will benefit from skilled physical therapy intervention to address current body structure impairments and activity limitations to improve function and work towards goals set in current POC in order to return to prior level of function or maximal functional improvement.   OBJECTIVE IMPAIRMENTS: Abnormal gait, decreased activity tolerance, decreased balance, decreased coordination, decreased endurance, decreased knowledge of condition, decreased knowledge of use of DME, decreased mobility, difficulty walking, decreased ROM, decreased strength, hypomobility, increased edema, increased fascial restrictions, impaired perceived functional ability, impaired flexibility, impaired sensation, improper body mechanics, obesity, and pain.   ACTIVITY LIMITATIONS: carrying, lifting, bending, sitting, standing, squatting, stairs, transfers, bed mobility, bathing, dressing, reach over head, hygiene/grooming, locomotion level, and caring for others  PARTICIPATION LIMITATIONS: meal prep, cleaning, laundry, interpersonal relationship, driving, shopping, community activity, occupation, and yard work  PERSONAL FACTORS: Past/current experiences, Transportation, and 1-2 comorbidities: migraines, pelvic floor dysfunction/pain  are also affecting patient's functional outcome.   REHAB POTENTIAL: Good  CLINICAL DECISION MAKING: Stable/uncomplicated  EVALUATION COMPLEXITY: Low   GOALS: Goals reviewed with patient?  No  SHORT TERM GOALS: Target date: 06/16/2023  Patient will be independent with initial home exercise program for self-management of symptoms. Baseline: Initial HEP provided at IE (06/02/23); Currently participating well in HEP (07/20/23); Goal status: In-progress   LONG TERM GOALS: Target date: 08/25/2023  Patient will be independent with a long-term home exercise program for self-management of symptoms.  Baseline: Initial HEP provided at IE (06/02/23); Currently participating well in HEP (07/20/23); Goal status: In-progress  2.  Patient will demonstrate improved FOTO to equal or greater than 68 by visit #15 to demonstrate improvement in overall condition and self-reported functional ability.  Baseline: 38 (06/02/23); 69 (07/20/23); Goal status: Met  3.  Patient will demonstrate the ability to ambulate equal or greater than 1300 feet on 6 Minute Walk Test with no gait deviations to improve her ability to complete community mobility.  Baseline: not tested - pt NWB on crutches at initial eval (06/02/23); 1066 feet while pushing stroller to comfort baby (07/20/23); Goal status: In-progress  4.  Patient will demonstrate L ankle PROM within 90% of right ankle PROM to improve her ability to ambulate normally and complete stairs.  Baseline: Initial AROM measured at left ankle significantly less than right  (06/02/23); limited compared to R  - see objective (07/20/2023);  Goal status: In-progress  5.  Patient will demonstrate improvement in Patient Specific Functional Scale (PSFS) of equal or greater than 3 points to reflect clinically significant improvement in patient's most valued functional activities. Baseline: to be measured visit #2 as appropriate (06/02/23); 1.33 (06/09/2023); 6 (07/20/23); Goal status: Met  6.  Patient will demonstrate the ability to perform 20 single leg heel raises on L LE to help normalize gait pattern and improve her ability to return to her exercise routine, complete  stairs, and care for her children safely and effectively.  Baseline: unable to weight bear on L LE (06/02/23); able to perform 5 single leg heel raises on L LE (07/20/23); Goal status: In-progress    PLAN:  PT FREQUENCY: 1-2x/week  PT DURATION: 12 weeks  PLANNED INTERVENTIONS: 97164- PT Re-evaluation, 97110-Therapeutic exercises, 97530- Therapeutic activity, 97112- Neuromuscular re-education, 97535- Self Care, [id number]- Manual therapy, [phone]- Gait training, 97014- Electrical stimulation (unattended), Patient/Family education, Balance training, Stair training, Dry Needling, Joint mobilization, Spinal mobilization, Cryotherapy, and Moist heat  PLAN FOR NEXT SESSION: update HEP as appropriate, progressive LE/functional strengthening/ROM/balance as appropriate. Normalize weight bearing and  gait mechanics within physician's and tissue healing limitations. Education.    Godric Lavell [place], SPT [employer] DPTE   Huntley Dec R. Ilsa Iha, PT, DPT 07/23/23, 12:18 PM  [facility] [address] [place], [place] [address] P: [phone] I F: [phone]

## 2023-07-27 ENCOUNTER — Ambulatory Visit: Payer: Medicaid Other | Admitting: Physical Therapy

## 2023-07-27 NOTE — Therapy (Deleted)
OUTPATIENT PHYSICAL THERAPY TREATMENT   Patient Name: Olivia Frost MRN: [id number] DOB:Aug 12, 1990, 33 y.o., female Today's Date: 07/27/2023  END OF SESSION:            Past Medical History:  Diagnosis Date   Anemia during pregnancy in third trimester 10/09/2022   COVID 03/26/2022   Migraines    Obesity in pregnancy 09/03/2022   Supervision of other normal pregnancy, antepartum 04/22/2022              Clinical Staff    Provider      Office Location     [facility]    Dating     11/21/2022, by Patient Reported         Language     English    Anatomy US     Normal       Flu Vaccine     offer    Genetic Screen     NIPS: Negative      TDaP vaccine      09/03/22    Hgb A1C or   GTT    Early :  Third trimester : 29      Covid    Has booster         LAB RESULTS       Rhogam     B/Positiv   SVD (spontaneous vaginal delivery) 10/07/2022   Trimalleolar fracture 03/2023   left   Past Surgical History:  Procedure Laterality Date   ORIF ANKLE FRACTURE Left 05/04/2023   Procedure: OPEN REDUCTION INTERNAL FIXATION (ORIF) ANKLE FRACTURE;  Surgeon: Candelaria Stagers, DPM;  Location: [facility];  Service: Orthopedics/Podiatry;  Laterality: Left;  POPLITEAL/SAPHENOUS BLOCK   WRIST SURGERY Left    Patient Active Problem List   Diagnosis Date Noted   Intrauterine device 01/11/2023    PCP: no PCP  REFERRING PROVIDER: Candelaria Stagers, DPM  REFERRING DIAG: closed fracture of left ankle  THERAPY DIAG:  No diagnosis found.  Rationale for Evaluation and Treatment: Rehabilitation  ONSET DATE: fracture on 04/22/2023 and surgery 05/04/2023  SUBJECTIVE:   PERTINENT HISTORY: Patient is a 33 y.o. female who presents to outpatient physical therapy with a referral for medical diagnosis closed fracture of left ankle. This patient's chief complaints consist of left ankle pain, stiffness, and dysfunction s/p L ORIF of left ankle fracture with syndesmotic stabilization on 05/04/2023,  leading to the  following functional deficits: difficulty with usual activities including basic household and community ambulation, transfers, self-care, stairs, ADL and IADLs, caring for her children, housework, laundry, working, driving without difficulty. Relevant past medical history and comorbidities include has a past medical history of pelvic floor dysfunction, disabling pelvic pain post partum, Anemia during pregnancy in third trimester (10/09/2022), Migraines, and Trimalleolar fracture (03/2023). Patient denies hx of cancer, stroke, seizures, lung problems, heart problems, diabetes, unexplained weight loss, unexplained changes in bowel or bladder problems, unexplained stumbling or dropping things, osteoporosis, and spinal surgery  SUBJECTIVE STATEMENT:   Patient arrives to session with both daughters present after school being cancelled today. She reports feeling continued soreness in posterior ankle and tightness in anterior ankle, but is feeling good overall. Patient states she has been doing her new stretches at home and has still been feeling tightness but there has been improvement since last session.   PAIN: Are you having pain? No NPRS: 0/10 currently. Stiffness/soreness but no pain in the left posterior ankle.    PRECAUTIONS:  Precautions regarding ankle have been discharge by  pediatrist with instructions to do what she can tolerate. Per patient has not been cleared to do exercise due to pelvic floor.   Dr. Allena Katz stated in discharge note: "She will finish out her physical therapy sessions at this time she has returned to regular shoes running without any restrictions."  (07/08/2023).    PATIENT GOALS: "hoping it will be as close to normalcy as possible" "would love to get back into walking without boot and to slowly start to integrate exercise"  NEXT MD VISIT: No follow-ups on file.    OBJECTIVE    TODAY'S TREATMENT:     Therapeutic exercise: to centralize symptoms and improve ROM,  strength, muscular endurance, and activity tolerance required for successful completion of functional activities.   - NuStep level 6 using bilateral upper and lower extremities. Seat/handle setting 11/12. For improved extremity mobility, muscular endurance, and activity tolerance; and to induce the analgesic effect of aerobic exercise, stimulate improved joint nutrition, and prepare body structures and systems for following interventions. x 6 minutes. Average SPM = 71.   - Runners step up on 12 inch step   UE support available (moderate use)  1x20 each leg   - L Single leg heel raise with right leg on step  1x5  Discontinued due to improper form and discomfort in left ankle  - Eccentric heel raises (lowering on left leg)   1x15  Modified to have minimal weight through right LE instead of being fully weight bearing on Left LE  - Education on HEP including handout   Neuromuscular Re-education: to improve, balance, postural strength, muscle activation patterns, and stabilization strength required for functional activities:     - Squats on BOSU (ball side down)     1x15    - Side stepping on airex beam (forefoot on beam)     1x10   Pt required multimodal cuing for proper technique and to facilitate improved neuromuscular control, strength, range of motion, and functional ability resulting in improved performance and form.    PATIENT EDUCATION:  Education details: Exercise purpose/form. Self management techniques.  Person educated: Patient Education method: Explanation, Demonstration, Tactile cues, Verbal cues, and Handouts Education comprehension: verbalized understanding, returned demonstration, verbal cues required, tactile cues required, and needs further education  HOME EXERCISE PROGRAM: Access Code: [email or web address] URL: [email or web address] Date: 07/22/2023 Prepared by: Jaymere Alen [place]  Exercises - Single Leg Stance  - 1 x daily - 3 sets - 30 secoonds hold - Heel  Raises with Counter Support  - 1 x daily - 2-3 sets - 20 reps - Standing Gastroc Stretch at Counter  - 1 x daily - 3 reps - 30 seconds hold - Standing Soleus Stretch at Counter  - 1 x daily - 3 reps - 30 hold - Long Sitting Ankle Eversion with Resistance  - 1 x daily - 15 reps - Long Sitting Ankle Inversion with Resistance  - 1 x daily - 1 sets - 15 reps - Seated Diaphragmatic Breathing  - 2 x daily - 2 sets - 20 reps - Supine Diaphragmatic Breathing  - 1-2 x daily - 2 sets - 20 reps - Supine Straight Leg Raise with Pelvic Floor Contraction  - 1-2 x daily - 1-2 sets - 10 reps - Supine Bridge with Knee Extension and Pelvic Floor Contraction  - 1-2 x daily - 2 sets - 10 reps - Sidelying Pelvic Floor Contraction with Hip Abduction  - 1-2 x daily - 2 sets - 15 reps - Happy Baby with  Pelvic Floor Lengthening  - Squat with Chair Touch  - 3 x weekly - 3 sets - 10 reps - Heel Raise on Step  - 3 x weekly - 2 sets - 20 reps - Lateral Step Down  - 3 x weekly - 2 sets - 20 reps - Standing Ankle Dorsiflexion Stretch on Chair  - 2-3 x daily - 1 sets - 20 reps - 5 hold - Standing Anterior Tibialis Stretch  - 2-3 x daily - Standing Eccentric Heel Raise  - 1 x daily - 1 sets - 20 reps - Runner's Step Up/Down  - 1 x daily - 2 sets - 20 reps  ASSESSMENT:  CLINICAL IMPRESSION:   Patient arrives to session feeling slight improvement in ankle stiffness since last session and continued good participation in HEP. The focus of today's session was to continue progressing exercises to improve ankle range of motion, lower extremity strength/endurance, and challenge balance to improve functional activity tolerance and weight bearing on the left lower extremity. Single leg heel raises with right foot elevated on step was discontinued due to patient apprehension/discomfort during exercise and improper form. Patient was able to tolerate eccentric heel raises better once modified to maintain minimal weight bearing through  her right lower extremity instead of being fully weight bearing on only left lower extremity. Patient tolerated interventions well and was able to complete all other exercises with minimal to no lasting increase in discomfort. Patient required multimodal cuing for proper technique and to facilitate neuromuscular control, strength, range of motion, and functional ability resulting in improved performance and form. Patient reports feeling good at end of session with less stiffness in left ankle and no increase in pain. Patient was provided a handout with update HEP including well tolerated exercises. Patient would benefit from continued management of limiting condition by skilled physical therapist to address remaining impairments and functional limitations to work towards stated goals and return to PLOF or maximal functional independence.    From initial PT evaluation on 06/02/2023:  Patient is a 33 y.o. female referred to outpatient physical therapy with a medical diagnosis of closed fracture of left ankle who presents with signs and symptoms consistent with left ankle pain, stiffness, weakness, and dysfunction s/p left open reduction internal fixation of ankle fracture with syndesmotic stabilization on 05/04/2023 2/2 left ankle fracture from fall on 04/22/2023. Patient presents with significant pain, ROM, joint stiffness, gait, tissue integrity, muscle performance (strength/power/endurance), and activity tolerance impairments that are limiting ability to complete her usual activities including basic household and community ambulation, transfers, self-care, stairs, ADL and IADLs, caring for her children, housework, laundry, working, driving without difficulty. Patient will benefit from skilled physical therapy intervention to address current body structure impairments and activity limitations to improve function and work towards goals set in current POC in order to return to prior level of function or maximal  functional improvement.   OBJECTIVE IMPAIRMENTS: Abnormal gait, decreased activity tolerance, decreased balance, decreased coordination, decreased endurance, decreased knowledge of condition, decreased knowledge of use of DME, decreased mobility, difficulty walking, decreased ROM, decreased strength, hypomobility, increased edema, increased fascial restrictions, impaired perceived functional ability, impaired flexibility, impaired sensation, improper body mechanics, obesity, and pain.   ACTIVITY LIMITATIONS: carrying, lifting, bending, sitting, standing, squatting, stairs, transfers, bed mobility, bathing, dressing, reach over head, hygiene/grooming, locomotion level, and caring for others  PARTICIPATION LIMITATIONS: meal prep, cleaning, laundry, interpersonal relationship, driving, shopping, community activity, occupation, and yard work  PERSONAL FACTORS: Past/current experiences, Transportation, and 1-2  comorbidities: migraines, pelvic floor dysfunction/pain  are also affecting patient's functional outcome.   REHAB POTENTIAL: Good  CLINICAL DECISION MAKING: Stable/uncomplicated  EVALUATION COMPLEXITY: Low   GOALS: Goals reviewed with patient? No  SHORT TERM GOALS: Target date: 06/16/2023  Patient will be independent with initial home exercise program for self-management of symptoms. Baseline: Initial HEP provided at IE (06/02/23); Currently participating well in HEP (07/20/23); Goal status: In-progress   LONG TERM GOALS: Target date: 08/25/2023  Patient will be independent with a long-term home exercise program for self-management of symptoms.  Baseline: Initial HEP provided at IE (06/02/23); Currently participating well in HEP (07/20/23); Goal status: In-progress  2.  Patient will demonstrate improved FOTO to equal or greater than 68 by visit #15 to demonstrate improvement in overall condition and self-reported functional ability.  Baseline: 38 (06/02/23); 69 (07/20/23); Goal  status: Met  3.  Patient will demonstrate the ability to ambulate equal or greater than 1300 feet on 6 Minute Walk Test with no gait deviations to improve her ability to complete community mobility.  Baseline: not tested - pt NWB on crutches at initial eval (06/02/23); 1066 feet while pushing stroller to comfort baby (07/20/23); Goal status: In-progress  4.  Patient will demonstrate L ankle PROM within 90% of right ankle PROM to improve her ability to ambulate normally and complete stairs.  Baseline: Initial AROM measured at left ankle significantly less than right  (06/02/23); limited compared to R  - see objective (07/20/2023);  Goal status: In-progress  5.  Patient will demonstrate improvement in Patient Specific Functional Scale (PSFS) of equal or greater than 3 points to reflect clinically significant improvement in patient's most valued functional activities. Baseline: to be measured visit #2 as appropriate (06/02/23); 1.33 (06/09/2023); 6 (07/20/23); Goal status: Met  6.  Patient will demonstrate the ability to perform 20 single leg heel raises on L LE to help normalize gait pattern and improve her ability to return to her exercise routine, complete stairs, and care for her children safely and effectively.  Baseline: unable to weight bear on L LE (06/02/23); able to perform 5 single leg heel raises on L LE (07/20/23); Goal status: In-progress    PLAN:  PT FREQUENCY: 1-2x/week  PT DURATION: 12 weeks  PLANNED INTERVENTIONS: 97164- PT Re-evaluation, 97110-Therapeutic exercises, 97530- Therapeutic activity, 97112- Neuromuscular re-education, 97535- Self Care, 16109- Manual therapy, [phone]- Gait training, 97014- Electrical stimulation (unattended), Patient/Family education, Balance training, Stair training, Dry Needling, Joint mobilization, Spinal mobilization, Cryotherapy, and Moist heat  PLAN FOR NEXT SESSION: update HEP as appropriate, progressive LE/functional strengthening/ROM/balance  as appropriate. Normalize weight bearing and gait mechanics within physician's and tissue healing limitations. Education.    Kaelee Pfeffer [place], SPT [employer] DPTE   Huntley Dec R. Ilsa Iha, PT, DPT 07/27/23, 10:31 AM  [facility] [address] [place], [place] [address] P: [phone] I F: [phone]

## 2023-07-28 ENCOUNTER — Encounter: Payer: Medicaid Other | Admitting: Physical Therapy

## 2023-07-29 ENCOUNTER — Encounter: Payer: Self-pay | Admitting: Physical Therapy

## 2023-07-29 ENCOUNTER — Ambulatory Visit: Payer: Medicaid Other | Admitting: Physical Therapy

## 2023-07-29 DIAGNOSIS — M25572 Pain in left ankle and joints of left foot: Secondary | ICD-10-CM | POA: Diagnosis not present

## 2023-07-29 DIAGNOSIS — M6281 Muscle weakness (generalized): Secondary | ICD-10-CM

## 2023-07-29 DIAGNOSIS — R262 Difficulty in walking, not elsewhere classified: Secondary | ICD-10-CM

## 2023-07-29 DIAGNOSIS — M25672 Stiffness of left ankle, not elsewhere classified: Secondary | ICD-10-CM

## 2023-07-29 NOTE — Therapy (Signed)
OUTPATIENT PHYSICAL THERAPY TREATMENT   Patient Name: Olivia Frost MRN: [id number] DOB:Jul 02, 1990, 33 y.o., female Today's Date: 07/29/2023  END OF SESSION:  PT End of Session - 07/29/23 1545     Visit Number 12    Number of Visits 17    Date for PT Re-Evaluation 08/25/23    Authorization Type [place] [employer] reporting period from 07/20/2023    Authorization Time Period [employer] auth#[id number] 12/9-08/06/23 for 16 PT visits    Authorization - Visit Number 11    Authorization - Number of Visits 16    Progress Note Due on Visit 10    PT Start Time 1353    PT Stop Time 1435    PT Time Calculation (min) 42 min    Activity Tolerance Patient tolerated treatment well    Behavior During Therapy [facility] for tasks assessed/performed                      Past Medical History:  Diagnosis Date   Anemia during pregnancy in third trimester 10/09/2022   COVID 03/26/2022   Migraines    Obesity in pregnancy 09/03/2022   Supervision of other normal pregnancy, antepartum 04/22/2022              Clinical Staff    Provider      Office Location     [facility]    Dating     11/21/2022, by Patient Reported         Language     English    Anatomy US     Normal       Flu Vaccine     offer    Genetic Screen     NIPS: Negative      TDaP vaccine      09/03/22    Hgb A1C or   GTT    Early :  Third trimester : 10      Covid    Has booster         LAB RESULTS       Rhogam     B/Positiv   SVD (spontaneous vaginal delivery) 10/07/2022   Trimalleolar fracture 03/2023   left   Past Surgical History:  Procedure Laterality Date   ORIF ANKLE FRACTURE Left 05/04/2023   Procedure: OPEN REDUCTION INTERNAL FIXATION (ORIF) ANKLE FRACTURE;  Surgeon: Candelaria Stagers, DPM;  Location: [facility] ORS;  Service: Orthopedics/Podiatry;  Laterality: Left;  POPLITEAL/SAPHENOUS BLOCK   WRIST SURGERY Left    Patient Active Problem List   Diagnosis Date Noted   Intrauterine device 01/11/2023    PCP: no  PCP  REFERRING PROVIDER: Candelaria Stagers, DPM  REFERRING DIAG: closed fracture of left ankle  THERAPY DIAG:  Pain in left ankle and joints of left foot  Stiffness of left ankle, not elsewhere classified  Muscle weakness (generalized)  Difficulty in walking, not elsewhere classified  Rationale for Evaluation and Treatment: Rehabilitation  ONSET DATE: fracture on 04/22/2023 and surgery 05/04/2023  SUBJECTIVE:   PERTINENT HISTORY: Patient is a 33 y.o. female who presents to outpatient physical therapy with a referral for medical diagnosis closed fracture of left ankle. This patient's chief complaints consist of left ankle pain, stiffness, and dysfunction s/p L ORIF of left ankle fracture with syndesmotic stabilization on 05/04/2023,  leading to the following functional deficits: difficulty with usual activities including basic household and community ambulation, transfers, self-care, stairs, ADL and IADLs, caring for her children, housework, laundry, working, driving without  difficulty. Relevant past medical history and comorbidities include has a past medical history of pelvic floor dysfunction, disabling pelvic pain post partum, Anemia during pregnancy in third trimester (10/09/2022), Migraines, and Trimalleolar fracture (03/2023). Patient denies hx of cancer, stroke, seizures, lung problems, heart problems, diabetes, unexplained weight loss, unexplained changes in bowel or bladder problems, unexplained stumbling or dropping things, osteoporosis, and spinal surgery  SUBJECTIVE STATEMENT: Patient arrives to session reporting increased soreness/stiffness since last PT session. Patient reports feeling increased stiffness in the morning and soreness that takes a couple hours to improve to a point where it feels okay. Patient states she feels more swelling in front side of ankle and if feels like it needs to pop when putting all weight on left foot. She states her exercises have been going well at  home but she is still nervous about eccentric heel raises with putting more weight on her left lower extremity. Patient reports she feels like she is limping more when she walks.   PAIN: Are you having pain? No NPRS: 0/10 currently. Stiffness/soreness but no pain in the left posterior ankle.    PRECAUTIONS:  Precautions regarding ankle have been discharge by pediatrist with instructions to do what she can tolerate. Per patient has not been cleared to do exercise due to pelvic floor.   Dr. Allena Katz stated in discharge note: "She will finish out her physical therapy sessions at this time she has returned to regular shoes running without any restrictions."  (07/08/2023).    PATIENT GOALS: "hoping it will be as close to normalcy as possible" "would love to get back into walking without boot and to slowly start to integrate exercise"  NEXT MD VISIT: No follow-ups on file.    OBJECTIVE    TODAY'S TREATMENT:     Therapeutic exercise: to centralize symptoms and improve ROM, strength, muscular endurance, and activity tolerance required for successful completion of functional activities.   - NuStep level 6 using bilateral upper and lower extremities. Seat/handle setting 11/12. For improved extremity mobility, muscular endurance, and activity tolerance; and to induce the analgesic effect of aerobic exercise, stimulate improved joint nutrition, and prepare body structures and systems for following interventions. x 6 minutes. Average SPM = 69.   - Runners step up on 12 inch step   UE support available (moderate use)  1x20 each leg  Cuing to control decent with right LE to put less pressure thorough left LE; improved tolerance with modification   - Eccentric heel raises (lowering on left leg)   1x15  Modified to have moderate weight through right LE instead of being fully weight bearing on Left LE  -  Standing Ankle Dorsiflexion Stretch on Chair  10 x 5 second hold   - Seated Anterior Tibialis  stretch   2 x 20-30 seconds   - Education on HEP including handout    Manual therapy: to reduce pain and tissue tension, improve range of motion, neuromodulation, in order to promote improved ability to complete functional activities.     - Posterior glides to left talocrural joint    4x30 seconds, Grade III   Pt required multimodal cuing for proper technique and to facilitate improved neuromuscular control, strength, range of motion, and functional ability resulting in improved performance and form.   PATIENT EDUCATION:  Education details: Exercise purpose/form. Self management techniques.  Person educated: Patient Education method: Explanation, Demonstration, Tactile cues, Verbal cues, and Handouts Education comprehension: verbalized understanding, returned demonstration, verbal cues required, tactile cues required, and  needs further education  HOME EXERCISE PROGRAM: Access Code: [email or web address] URL: [email or web address] Date: 07/22/2023 Prepared by: Sriram Febles [place]  Exercises - Single Leg Stance  - 1 x daily - 3 sets - 30 secoonds hold - Heel Raises with Counter Support  - 1 x daily - 2-3 sets - 20 reps - Standing Gastroc Stretch at Counter  - 1 x daily - 3 reps - 30 seconds hold - Standing Soleus Stretch at Counter  - 1 x daily - 3 reps - 30 hold - Long Sitting Ankle Eversion with Resistance  - 1 x daily - 15 reps - Long Sitting Ankle Inversion with Resistance  - 1 x daily - 1 sets - 15 reps - Seated Diaphragmatic Breathing  - 2 x daily - 2 sets - 20 reps - Supine Diaphragmatic Breathing  - 1-2 x daily - 2 sets - 20 reps - Supine Straight Leg Raise with Pelvic Floor Contraction  - 1-2 x daily - 1-2 sets - 10 reps - Supine Bridge with Knee Extension and Pelvic Floor Contraction  - 1-2 x daily - 2 sets - 10 reps - Sidelying Pelvic Floor Contraction with Hip Abduction  - 1-2 x daily - 2 sets - 15 reps - Happy Baby with Pelvic Floor Lengthening  - Squat with Chair Touch   - 3 x weekly - 3 sets - 10 reps - Heel Raise on Step  - 3 x weekly - 2 sets - 20 reps - Lateral Step Down  - 3 x weekly - 2 sets - 20 reps - Standing Ankle Dorsiflexion Stretch on Chair  - 2-3 x daily - 1 sets - 20 reps - 5 hold - Standing Anterior Tibialis Stretch  - 2-3 x daily - Standing Eccentric Heel Raise  - 1 x daily - 1 sets - 20 reps - Runner's Step Up/Down  - 1 x daily - 2 sets - 20 reps  ASSESSMENT:  CLINICAL IMPRESSION: Patient arrives to session reporting increased left ankle stiffness/soreness since last session. The focus of today's session was to continue progressing exercises to improve ankle range of motion, lower extremity strength, and balance to improve activity tolerance and weight bearing on the left lower extremity. Patient was able to tolerate eccentric heel raises better while keeping more weight through her right lower extremity instead of being fully weight bearing on left lower extremity. Tolerance to runners step ups improved when cued to control decent with right lower extremity to produce less force on left lower extremity. Patient tolerated all other interventions well and was able to complete exercises with minimal increase in discomfort. Patient required multimodal cuing for proper technique and to facilitate neuromuscular control, strength, range of motion, and functional ability resulting in improved performance and form. Posterior glides to the left talocrural joint done to improve ankle range of motion and reduce tissue tension. Patient advised to keep HEP the same and continue working on modified exercises at home. Patient reports feeling some left ankle soreness but improved range of motion and no increase in pain at end of session. Patient would benefit from continued management of limiting condition by skilled physical therapist to address remaining impairments and functional limitations to work towards stated goals and return to PLOF or maximal functional  independence.    From initial PT evaluation on 06/02/2023:  Patient is a 33 y.o. female referred to outpatient physical therapy with a medical diagnosis of closed fracture of left ankle who presents with signs and symptoms consistent with left  ankle pain, stiffness, weakness, and dysfunction s/p left open reduction internal fixation of ankle fracture with syndesmotic stabilization on 05/04/2023 2/2 left ankle fracture from fall on 04/22/2023. Patient presents with significant pain, ROM, joint stiffness, gait, tissue integrity, muscle performance (strength/power/endurance), and activity tolerance impairments that are limiting ability to complete her usual activities including basic household and community ambulation, transfers, self-care, stairs, ADL and IADLs, caring for her children, housework, laundry, working, driving without difficulty. Patient will benefit from skilled physical therapy intervention to address current body structure impairments and activity limitations to improve function and work towards goals set in current POC in order to return to prior level of function or maximal functional improvement.   OBJECTIVE IMPAIRMENTS: Abnormal gait, decreased activity tolerance, decreased balance, decreased coordination, decreased endurance, decreased knowledge of condition, decreased knowledge of use of DME, decreased mobility, difficulty walking, decreased ROM, decreased strength, hypomobility, increased edema, increased fascial restrictions, impaired perceived functional ability, impaired flexibility, impaired sensation, improper body mechanics, obesity, and pain.   ACTIVITY LIMITATIONS: carrying, lifting, bending, sitting, standing, squatting, stairs, transfers, bed mobility, bathing, dressing, reach over head, hygiene/grooming, locomotion level, and caring for others  PARTICIPATION LIMITATIONS: meal prep, cleaning, laundry, interpersonal relationship, driving, shopping, community activity,  occupation, and yard work  PERSONAL FACTORS: Past/current experiences, Transportation, and 1-2 comorbidities: migraines, pelvic floor dysfunction/pain  are also affecting patient's functional outcome.   REHAB POTENTIAL: Good  CLINICAL DECISION MAKING: Stable/uncomplicated  EVALUATION COMPLEXITY: Low   GOALS: Goals reviewed with patient? No  SHORT TERM GOALS: Target date: 06/16/2023  Patient will be independent with initial home exercise program for self-management of symptoms. Baseline: Initial HEP provided at IE (06/02/23); Currently participating well in HEP (07/20/23); Goal status: In-progress   LONG TERM GOALS: Target date: 08/25/2023  Patient will be independent with a long-term home exercise program for self-management of symptoms.  Baseline: Initial HEP provided at IE (06/02/23); Currently participating well in HEP (07/20/23); Goal status: In-progress  2.  Patient will demonstrate improved FOTO to equal or greater than 68 by visit #15 to demonstrate improvement in overall condition and self-reported functional ability.  Baseline: 38 (06/02/23); 69 (07/20/23); Goal status: Met  3.  Patient will demonstrate the ability to ambulate equal or greater than 1300 feet on 6 Minute Walk Test with no gait deviations to improve her ability to complete community mobility.  Baseline: not tested - pt NWB on crutches at initial eval (06/02/23); 1066 feet while pushing stroller to comfort baby (07/20/23); Goal status: In-progress  4.  Patient will demonstrate L ankle PROM within 90% of right ankle PROM to improve her ability to ambulate normally and complete stairs.  Baseline: Initial AROM measured at left ankle significantly less than right  (06/02/23); limited compared to R  - see objective (07/20/2023);  Goal status: In-progress  5.  Patient will demonstrate improvement in Patient Specific Functional Scale (PSFS) of equal or greater than 3 points to reflect clinically significant  improvement in patient's most valued functional activities. Baseline: to be measured visit #2 as appropriate (06/02/23); 1.33 (06/09/2023); 6 (07/20/23); Goal status: Met  6.  Patient will demonstrate the ability to perform 20 single leg heel raises on L LE to help normalize gait pattern and improve her ability to return to her exercise routine, complete stairs, and care for her children safely and effectively.  Baseline: unable to weight bear on L LE (06/02/23); able to perform 5 single leg heel raises on L LE (07/20/23); Goal status: In-progress    PLAN:  PT FREQUENCY: 1-2x/week  PT DURATION: 12 weeks  PLANNED INTERVENTIONS: 97164- PT Re-evaluation, 97110-Therapeutic exercises, 97530- Therapeutic activity, 97112- Neuromuscular re-education, 97535- Self Care, 78469- Manual therapy, [phone]- Gait training, 97014- Electrical stimulation (unattended), Patient/Family education, Balance training, Stair training, Dry Needling, Joint mobilization, Spinal mobilization, Cryotherapy, and Moist heat  PLAN FOR NEXT SESSION: update HEP as appropriate, progressive LE/functional strengthening/ROM/balance as appropriate. Normalize weight bearing and gait mechanics within physician's and tissue healing limitations. Education.    Patti Shorb [place], SPT [employer] DPTE   Huntley Dec R. Ilsa Iha, PT, DPT 07/29/23, 3:46 PM  [facility] [address] [place], [place] [address] P: [phone] I F: [phone]

## 2023-08-02 ENCOUNTER — Telehealth: Payer: Self-pay | Admitting: Physical Therapy

## 2023-08-02 ENCOUNTER — Ambulatory Visit: Payer: Medicaid Other | Attending: Podiatry | Admitting: Physical Therapy

## 2023-08-02 NOTE — Therapy (Incomplete)
OUTPATIENT PHYSICAL THERAPY TREATMENT   Patient Name: Olivia Frost MRN: [id number] DOB:[birth year], 33 y.o., female Today's Date: 08/02/2023  END OF SESSION:             Past Medical History:  Diagnosis Date   Anemia during pregnancy in third trimester 10/09/2022   COVID 03/26/2022   Migraines    Obesity in pregnancy 09/03/2022   Supervision of other normal pregnancy, antepartum 04/22/2022              Clinical Staff    Provider      Office Location     [facility]    Dating     11/21/2022, by Patient Reported         Language     English    Anatomy US     Normal       Flu Vaccine     offer    Genetic Screen     NIPS: Negative      TDaP vaccine      09/03/22    Hgb A1C or   GTT    Early :  Third trimester : 74      Covid    Has booster         LAB RESULTS       Rhogam     B/Positiv   SVD (spontaneous vaginal delivery) 10/07/2022   Trimalleolar fracture 03/2023   left   Past Surgical History:  Procedure Laterality Date   ORIF ANKLE FRACTURE Left 05/04/2023   Procedure: OPEN REDUCTION INTERNAL FIXATION (ORIF) ANKLE FRACTURE;  Surgeon: Candelaria Stagers, DPM;  Location: [facility] ORS;  Service: Orthopedics/Podiatry;  Laterality: Left;  POPLITEAL/SAPHENOUS BLOCK   WRIST SURGERY Left    Patient Active Problem List   Diagnosis Date Noted   Intrauterine device 01/11/2023    PCP: no PCP  REFERRING PROVIDER: Candelaria Stagers, DPM  REFERRING DIAG: closed fracture of left ankle  THERAPY DIAG:  No diagnosis found.  Rationale for Evaluation and Treatment: Rehabilitation  ONSET DATE: fracture on 04/22/2023 and surgery 05/04/2023  SUBJECTIVE:   PERTINENT HISTORY: Patient is a 33 y.o. female who presents to outpatient physical therapy with a referral for medical diagnosis closed fracture of left ankle. This patient's chief complaints consist of left ankle pain, stiffness, and dysfunction s/p L ORIF of left ankle fracture with syndesmotic stabilization on 05/04/2023,  leading to the  following functional deficits: difficulty with usual activities including basic household and community ambulation, transfers, self-care, stairs, ADL and IADLs, caring for her children, housework, laundry, working, driving without difficulty. Relevant past medical history and comorbidities include has a past medical history of pelvic floor dysfunction, disabling pelvic pain post partum, Anemia during pregnancy in third trimester (10/09/2022), Migraines, and Trimalleolar fracture (03/2023). Patient denies hx of cancer, stroke, seizures, lung problems, heart problems, diabetes, unexplained weight loss, unexplained changes in bowel or bladder problems, unexplained stumbling or dropping things, osteoporosis, and spinal surgery  SUBJECTIVE STATEMENT: Patient arrives to session reporting increased soreness/stiffness since last PT session. Patient reports feeling increased stiffness in the morning and soreness that takes a couple hours to improve to a point where it feels okay. Patient states she feels more swelling in front side of ankle and if feels like it needs to pop when putting all weight on left foot. She states her exercises have been going well at home but she is still nervous about eccentric heel raises with putting more weight on her left lower extremity.  Patient reports she feels like she is limping more when she walks.   PAIN: Are you having pain? No NPRS: 0/10 currently. Stiffness/soreness but no pain in the left posterior ankle.    PRECAUTIONS:  Precautions regarding ankle have been discharge by pediatrist with instructions to do what she can tolerate. Per patient has not been cleared to do exercise due to pelvic floor.   Dr. Allena Katz stated in discharge note: "She will finish out her physical therapy sessions at this time she has returned to regular shoes running without any restrictions."  (07/08/2023).    PATIENT GOALS: "hoping it will be as close to normalcy as possible" "would love to get  back into walking without boot and to slowly start to integrate exercise"  NEXT MD VISIT: No follow-ups on file.    OBJECTIVE    TODAY'S TREATMENT:     Therapeutic exercise: to centralize symptoms and improve ROM, strength, muscular endurance, and activity tolerance required for successful completion of functional activities.   - NuStep level 6 using bilateral upper and lower extremities. Seat/handle setting 11/12. For improved extremity mobility, muscular endurance, and activity tolerance; and to induce the analgesic effect of aerobic exercise, stimulate improved joint nutrition, and prepare body structures and systems for following interventions. x 6 minutes. Average SPM = 69.   - Runners step up on 12 inch step   UE support available (moderate use)  1x20 each leg  Cuing to control decent with right LE to put less pressure thorough left LE; improved tolerance with modification   - Eccentric heel raises (lowering on left leg)   1x15  Modified to have moderate weight through right LE instead of being fully weight bearing on Left LE  -  Standing Ankle Dorsiflexion Stretch on Chair  10 x 5 second hold   - Seated Anterior Tibialis stretch   2 x 20-30 seconds   - Education on HEP including handout    Manual therapy: to reduce pain and tissue tension, improve range of motion, neuromodulation, in order to promote improved ability to complete functional activities.     - Posterior glides to left talocrural joint    4x30 seconds, Grade III   Pt required multimodal cuing for proper technique and to facilitate improved neuromuscular control, strength, range of motion, and functional ability resulting in improved performance and form.   PATIENT EDUCATION:  Education details: Exercise purpose/form. Self management techniques.  Person educated: Patient Education method: Explanation, Demonstration, Tactile cues, Verbal cues, and Handouts Education comprehension: verbalized  understanding, returned demonstration, verbal cues required, tactile cues required, and needs further education  HOME EXERCISE PROGRAM: Access Code: [email or web address] URL: [email or web address] Date: 07/22/2023 Prepared by: Seairra Otani [place]  Exercises - Single Leg Stance  - 1 x daily - 3 sets - 30 secoonds hold - Heel Raises with Counter Support  - 1 x daily - 2-3 sets - 20 reps - Standing Gastroc Stretch at Counter  - 1 x daily - 3 reps - 30 seconds hold - Standing Soleus Stretch at Counter  - 1 x daily - 3 reps - 30 hold - Long Sitting Ankle Eversion with Resistance  - 1 x daily - 15 reps - Long Sitting Ankle Inversion with Resistance  - 1 x daily - 1 sets - 15 reps - Seated Diaphragmatic Breathing  - 2 x daily - 2 sets - 20 reps - Supine Diaphragmatic Breathing  - 1-2 x daily - 2 sets - 20 reps - Supine Straight Leg Raise with  Pelvic Floor Contraction  - 1-2 x daily - 1-2 sets - 10 reps - Supine Bridge with Knee Extension and Pelvic Floor Contraction  - 1-2 x daily - 2 sets - 10 reps - Sidelying Pelvic Floor Contraction with Hip Abduction  - 1-2 x daily - 2 sets - 15 reps - Happy Baby with Pelvic Floor Lengthening  - Squat with Chair Touch  - 3 x weekly - 3 sets - 10 reps - Heel Raise on Step  - 3 x weekly - 2 sets - 20 reps - Lateral Step Down  - 3 x weekly - 2 sets - 20 reps - Standing Ankle Dorsiflexion Stretch on Chair  - 2-3 x daily - 1 sets - 20 reps - 5 hold - Standing Anterior Tibialis Stretch  - 2-3 x daily - Standing Eccentric Heel Raise  - 1 x daily - 1 sets - 20 reps - Runner's Step Up/Down  - 1 x daily - 2 sets - 20 reps  ASSESSMENT:  CLINICAL IMPRESSION: Patient arrives to session reporting increased left ankle stiffness/soreness since last session. The focus of today's session was to continue progressing exercises to improve ankle range of motion, lower extremity strength, and balance to improve activity tolerance and weight bearing on the left lower extremity.  Patient was able to tolerate eccentric heel raises better while keeping more weight through her right lower extremity instead of being fully weight bearing on left lower extremity. Tolerance to runners step ups improved when cued to control decent with right lower extremity to produce less force on left lower extremity. Patient tolerated all other interventions well and was able to complete exercises with minimal increase in discomfort. Patient required multimodal cuing for proper technique and to facilitate neuromuscular control, strength, range of motion, and functional ability resulting in improved performance and form. Posterior glides to the left talocrural joint done to improve ankle range of motion and reduce tissue tension. Patient advised to keep HEP the same and continue working on modified exercises at home. Patient reports feeling some left ankle soreness but improved range of motion and no increase in pain at end of session. Patient would benefit from continued management of limiting condition by skilled physical therapist to address remaining impairments and functional limitations to work towards stated goals and return to PLOF or maximal functional independence.    From initial PT evaluation on 06/02/2023:  Patient is a 33 y.o. female referred to outpatient physical therapy with a medical diagnosis of closed fracture of left ankle who presents with signs and symptoms consistent with left ankle pain, stiffness, weakness, and dysfunction s/p left open reduction internal fixation of ankle fracture with syndesmotic stabilization on 05/04/2023 2/2 left ankle fracture from fall on 04/22/2023. Patient presents with significant pain, ROM, joint stiffness, gait, tissue integrity, muscle performance (strength/power/endurance), and activity tolerance impairments that are limiting ability to complete her usual activities including basic household and community ambulation, transfers, self-care, stairs, ADL and  IADLs, caring for her children, housework, laundry, working, driving without difficulty. Patient will benefit from skilled physical therapy intervention to address current body structure impairments and activity limitations to improve function and work towards goals set in current POC in order to return to prior level of function or maximal functional improvement.   OBJECTIVE IMPAIRMENTS: Abnormal gait, decreased activity tolerance, decreased balance, decreased coordination, decreased endurance, decreased knowledge of condition, decreased knowledge of use of DME, decreased mobility, difficulty walking, decreased ROM, decreased strength, hypomobility, increased edema, increased fascial restrictions,  impaired perceived functional ability, impaired flexibility, impaired sensation, improper body mechanics, obesity, and pain.   ACTIVITY LIMITATIONS: carrying, lifting, bending, sitting, standing, squatting, stairs, transfers, bed mobility, bathing, dressing, reach over head, hygiene/grooming, locomotion level, and caring for others  PARTICIPATION LIMITATIONS: meal prep, cleaning, laundry, interpersonal relationship, driving, shopping, community activity, occupation, and yard work  PERSONAL FACTORS: Past/current experiences, Transportation, and 1-2 comorbidities: migraines, pelvic floor dysfunction/pain  are also affecting patient's functional outcome.   REHAB POTENTIAL: Good  CLINICAL DECISION MAKING: Stable/uncomplicated  EVALUATION COMPLEXITY: Low   GOALS: Goals reviewed with patient? No  SHORT TERM GOALS: Target date: 06/16/2023  Patient will be independent with initial home exercise program for self-management of symptoms. Baseline: Initial HEP provided at IE (06/02/23); Currently participating well in HEP (07/20/23); Goal status: In-progress   LONG TERM GOALS: Target date: 08/25/2023  Patient will be independent with a long-term home exercise program for self-management of symptoms.   Baseline: Initial HEP provided at IE (06/02/23); Currently participating well in HEP (07/20/23); Goal status: In-progress  2.  Patient will demonstrate improved FOTO to equal or greater than 68 by visit #15 to demonstrate improvement in overall condition and self-reported functional ability.  Baseline: 38 (06/02/23); 69 (07/20/23); Goal status: Met  3.  Patient will demonstrate the ability to ambulate equal or greater than 1300 feet on 6 Minute Walk Test with no gait deviations to improve her ability to complete community mobility.  Baseline: not tested - pt NWB on crutches at initial eval (06/02/23); 1066 feet while pushing stroller to comfort baby (07/20/23); Goal status: In-progress  4.  Patient will demonstrate L ankle PROM within 90% of right ankle PROM to improve her ability to ambulate normally and complete stairs.  Baseline: Initial AROM measured at left ankle significantly less than right  (06/02/23); limited compared to R  - see objective (07/20/2023);  Goal status: In-progress  5.  Patient will demonstrate improvement in Patient Specific Functional Scale (PSFS) of equal or greater than 3 points to reflect clinically significant improvement in patient's most valued functional activities. Baseline: to be measured visit #2 as appropriate (06/02/23); 1.33 (06/09/2023); 6 (07/20/23); Goal status: Met  6.  Patient will demonstrate the ability to perform 20 single leg heel raises on L LE to help normalize gait pattern and improve her ability to return to her exercise routine, complete stairs, and care for her children safely and effectively.  Baseline: unable to weight bear on L LE (06/02/23); able to perform 5 single leg heel raises on L LE (07/20/23); Goal status: In-progress    PLAN:  PT FREQUENCY: 1-2x/week  PT DURATION: 12 weeks  PLANNED INTERVENTIONS: 97164- PT Re-evaluation, 97110-Therapeutic exercises, 97530- Therapeutic activity, 97112- Neuromuscular re-education, 97535- Self  Care, [address]- Manual therapy, [phone]- Gait training, 97014- Electrical stimulation (unattended), Patient/Family education, Balance training, Stair training, Dry Needling, Joint mobilization, Spinal mobilization, Cryotherapy, and Moist heat  PLAN FOR NEXT SESSION: update HEP as appropriate, progressive LE/functional strengthening/ROM/balance as appropriate. Normalize weight bearing and gait mechanics within physician's and tissue healing limitations. Education.    Neiman Roots [place], SPT [employer] DPTE   Huntley Dec R. Ilsa Iha, PT, DPT 08/02/23, 1:49 PM  [facility] [address] [place], [place] [address] P: [phone] I F: [phone]

## 2023-08-02 NOTE — Telephone Encounter (Signed)
Called patient when she did not show up for her 5:30pm appointment schedule today. Patient answered and said she has been taking care of a sick baby all day and forgot her appointment.   She verified when her next appointment is (Thursday 08/05/2023 at 4:45pm) and said she would put in a reminder in her phone to call on Wednesday if she is unable to come. She understands we need to see her for a progress note to request more auth (for best results), but that she should not come in if she is sick (or bring baby if baby is sick).   Luretha Murphy. Ilsa Iha, PT, DPT 08/02/23, 5:52 PM  [facility] [address] [place], [place] [address] P: [phone] I F: [phone]

## 2023-08-05 ENCOUNTER — Ambulatory Visit: Payer: Medicaid Other | Admitting: Physical Therapy

## 2023-08-05 ENCOUNTER — Ambulatory Visit: Payer: Self-pay | Admitting: Family Medicine

## 2023-08-11 ENCOUNTER — Ambulatory Visit: Payer: Medicaid Other | Admitting: Physical Therapy

## 2023-08-11 ENCOUNTER — Telehealth: Payer: Self-pay | Admitting: Physical Therapy

## 2023-08-11 NOTE — Telephone Encounter (Signed)
Attempted to call patient after she did not show up for her PT appointment scheduled at 4pm today. Phone rang and rang without VM or pick up.   Olivia Frost. Ilsa Iha, PT, DPT 08/11/23, 4:48 PM  [facility] [address] [place], [place] [address] P: [phone] I F: [phone]

## 2023-08-16 ENCOUNTER — Ambulatory Visit: Payer: Medicaid Other | Admitting: Physical Therapy

## 2023-08-16 ENCOUNTER — Telehealth: Payer: Self-pay | Admitting: Physical Therapy

## 2023-08-16 NOTE — Telephone Encounter (Signed)
Attempted to call patient after she did not show up for her PT appointment scheduled at 1pm today. Phone rang and rang and no one answered. Unable to leave VM.   Per cancellation policy, requested support staff to take patient off schedule after 3rd consecutive no-show and 2nd consecutive no-show with unsuccessful attempt to contact patient.   Luretha Murphy. Ilsa Iha, PT, DPT 08/16/23, 1:53 PM  [facility] [address] [place], [place] [address] P: [phone] I F: [phone]

## 2023-08-16 NOTE — Therapy (Deleted)
 OUTPATIENT PHYSICAL THERAPY TREATMENT   Patient Name: Olivia Frost MRN: [id number] DOB:1991/03/06, 33 y.o., female Today's Date: 08/16/2023  END OF SESSION:             Past Medical History:  Diagnosis Date   Anemia during pregnancy in third trimester 10/09/2022   COVID 03/26/2022   Migraines    Obesity in pregnancy 09/03/2022   Supervision of other normal pregnancy, antepartum 04/22/2022              Clinical Staff    Provider      Office Location     [facility]    Dating     11/21/2022, by Patient Reported         Language     English    Anatomy US     Normal       Flu Vaccine     offer    Genetic Screen     NIPS: Negative      TDaP vaccine      09/03/22    Hgb A1C or   GTT    Early :  Third trimester : 75      Covid    Has booster         LAB RESULTS       Rhogam     B/Positiv   SVD (spontaneous vaginal delivery) 10/07/2022   Trimalleolar fracture 03/2023   left   Past Surgical History:  Procedure Laterality Date   ORIF ANKLE FRACTURE Left 05/04/2023   Procedure: OPEN REDUCTION INTERNAL FIXATION (ORIF) ANKLE FRACTURE;  Surgeon: Candelaria Stagers, DPM;  Location: [facility];  Service: Orthopedics/Podiatry;  Laterality: Left;  POPLITEAL/SAPHENOUS BLOCK   WRIST SURGERY Left    Patient Active Problem List   Diagnosis Date Noted   Intrauterine device 01/11/2023    PCP: no PCP  REFERRING PROVIDER: Candelaria Stagers, DPM  REFERRING DIAG: closed fracture of left ankle  THERAPY DIAG:  No diagnosis found.  Rationale for Evaluation and Treatment: Rehabilitation  ONSET DATE: fracture on 04/22/2023 and surgery 05/04/2023  SUBJECTIVE:   PERTINENT HISTORY: Patient is a 33 y.o. female who presents to outpatient physical therapy with a referral for medical diagnosis closed fracture of left ankle. This patient's chief complaints consist of left ankle pain, stiffness, and dysfunction s/p L ORIF of left ankle fracture with syndesmotic stabilization on 05/04/2023,  leading to  the following functional deficits: difficulty with usual activities including basic household and community ambulation, transfers, self-care, stairs, ADL and IADLs, caring for her children, housework, laundry, working, driving without difficulty. Relevant past medical history and comorbidities include has a past medical history of pelvic floor dysfunction, disabling pelvic pain post partum, Anemia during pregnancy in third trimester (10/09/2022), Migraines, and Trimalleolar fracture (03/2023). Patient denies hx of cancer, stroke, seizures, lung problems, heart problems, diabetes, unexplained weight loss, unexplained changes in bowel or bladder problems, unexplained stumbling or dropping things, osteoporosis, and spinal surgery  SUBJECTIVE STATEMENT:   Patient arrives to session reporting increased soreness/stiffness since last PT session. Patient reports feeling increased stiffness in the morning and soreness that takes a couple hours to improve to a point where it feels okay. Patient states she feels more swelling in front side of ankle and if feels like it needs to pop when putting all weight on left foot. She states her exercises have been going well at home but she is still nervous about eccentric heel raises with putting more weight on her left  lower extremity. Patient reports she feels like she is limping more when she walks.   PAIN: Are you having pain? No NPRS: 0/10 currently. Stiffness/soreness but no pain in the left posterior ankle.    PRECAUTIONS:  Precautions regarding ankle have been discharge by pediatrist with instructions to do what she can tolerate. Per patient has not been cleared to do exercise due to pelvic floor.   Dr. Allena Katz stated in discharge note: "She will finish out her physical therapy sessions at this time she has returned to regular shoes running without any restrictions."  (07/08/2023).    PATIENT GOALS: "hoping it will be as close to normalcy as possible" "would love to  get back into walking without boot and to slowly start to integrate exercise"  NEXT MD VISIT: No follow-ups on file.    OBJECTIVE    TODAY'S TREATMENT:     Therapeutic exercise: to centralize symptoms and improve ROM, strength, muscular endurance, and activity tolerance required for successful completion of functional activities.   - NuStep level 6 using bilateral upper and lower extremities. Seat/handle setting 11/12. For improved extremity mobility, muscular endurance, and activity tolerance; and to induce the analgesic effect of aerobic exercise, stimulate improved joint nutrition, and prepare body structures and systems for following interventions. x 6 minutes. Average SPM = 69.   - Runners step up on 12 inch step   UE support available (moderate use)  1x20 each leg  Cuing to control decent with right LE to put less pressure thorough left LE; improved tolerance with modification   - Eccentric heel raises (lowering on left leg)   1x15  Modified to have moderate weight through right LE instead of being fully weight bearing on Left LE  -  Standing Ankle Dorsiflexion Stretch on Chair  10 x 5 second hold   - Seated Anterior Tibialis stretch   2 x 20-30 seconds   - Education on HEP including handout    Manual therapy: to reduce pain and tissue tension, improve range of motion, neuromodulation, in order to promote improved ability to complete functional activities.     - Posterior glides to left talocrural joint    4x30 seconds, Grade III   Pt required multimodal cuing for proper technique and to facilitate improved neuromuscular control, strength, range of motion, and functional ability resulting in improved performance and form.   PATIENT EDUCATION:  Education details: Exercise purpose/form. Self management techniques.  Person educated: Patient Education method: Explanation, Demonstration, Tactile cues, Verbal cues, and Handouts Education comprehension: verbalized  understanding, returned demonstration, verbal cues required, tactile cues required, and needs further education  HOME EXERCISE PROGRAM: Access Code: [email or web address] URL: [email or web address] Date: 07/22/2023 Prepared by: Chrisa Hassan [place]  Exercises - Single Leg Stance  - 1 x daily - 3 sets - 30 secoonds hold - Heel Raises with Counter Support  - 1 x daily - 2-3 sets - 20 reps - Standing Gastroc Stretch at Counter  - 1 x daily - 3 reps - 30 seconds hold - Standing Soleus Stretch at Counter  - 1 x daily - 3 reps - 30 hold - Long Sitting Ankle Eversion with Resistance  - 1 x daily - 15 reps - Long Sitting Ankle Inversion with Resistance  - 1 x daily - 1 sets - 15 reps - Seated Diaphragmatic Breathing  - 2 x daily - 2 sets - 20 reps - Supine Diaphragmatic Breathing  - 1-2 x daily - 2 sets - 20 reps - Supine Straight Leg  Raise with Pelvic Floor Contraction  - 1-2 x daily - 1-2 sets - 10 reps - Supine Bridge with Knee Extension and Pelvic Floor Contraction  - 1-2 x daily - 2 sets - 10 reps - Sidelying Pelvic Floor Contraction with Hip Abduction  - 1-2 x daily - 2 sets - 15 reps - Happy Baby with Pelvic Floor Lengthening  - Squat with Chair Touch  - 3 x weekly - 3 sets - 10 reps - Heel Raise on Step  - 3 x weekly - 2 sets - 20 reps - Lateral Step Down  - 3 x weekly - 2 sets - 20 reps - Standing Ankle Dorsiflexion Stretch on Chair  - 2-3 x daily - 1 sets - 20 reps - 5 hold - Standing Anterior Tibialis Stretch  - 2-3 x daily - Standing Eccentric Heel Raise  - 1 x daily - 1 sets - 20 reps - Runner's Step Up/Down  - 1 x daily - 2 sets - 20 reps  ASSESSMENT:  CLINICAL IMPRESSION:   Patient arrives to session reporting increased left ankle stiffness/soreness since last session. The focus of today's session was to continue progressing exercises to improve ankle range of motion, lower extremity strength, and balance to improve activity tolerance and weight bearing on the left lower extremity.  Patient was able to tolerate eccentric heel raises better while keeping more weight through her right lower extremity instead of being fully weight bearing on left lower extremity. Tolerance to runners step ups improved when cued to control decent with right lower extremity to produce less force on left lower extremity. Patient tolerated all other interventions well and was able to complete exercises with minimal increase in discomfort. Patient required multimodal cuing for proper technique and to facilitate neuromuscular control, strength, range of motion, and functional ability resulting in improved performance and form. Posterior glides to the left talocrural joint done to improve ankle range of motion and reduce tissue tension. Patient advised to keep HEP the same and continue working on modified exercises at home. Patient reports feeling some left ankle soreness but improved range of motion and no increase in pain at end of session. Patient would benefit from continued management of limiting condition by skilled physical therapist to address remaining impairments and functional limitations to work towards stated goals and return to PLOF or maximal functional independence.    From initial PT evaluation on 06/02/2023:  Patient is a 33 y.o. female referred to outpatient physical therapy with a medical diagnosis of closed fracture of left ankle who presents with signs and symptoms consistent with left ankle pain, stiffness, weakness, and dysfunction s/p left open reduction internal fixation of ankle fracture with syndesmotic stabilization on 05/04/2023 2/2 left ankle fracture from fall on 04/22/2023. Patient presents with significant pain, ROM, joint stiffness, gait, tissue integrity, muscle performance (strength/power/endurance), and activity tolerance impairments that are limiting ability to complete her usual activities including basic household and community ambulation, transfers, self-care, stairs, ADL and  IADLs, caring for her children, housework, laundry, working, driving without difficulty. Patient will benefit from skilled physical therapy intervention to address current body structure impairments and activity limitations to improve function and work towards goals set in current POC in order to return to prior level of function or maximal functional improvement.   OBJECTIVE IMPAIRMENTS: Abnormal gait, decreased activity tolerance, decreased balance, decreased coordination, decreased endurance, decreased knowledge of condition, decreased knowledge of use of DME, decreased mobility, difficulty walking, decreased ROM, decreased strength, hypomobility, increased  edema, increased fascial restrictions, impaired perceived functional ability, impaired flexibility, impaired sensation, improper body mechanics, obesity, and pain.   ACTIVITY LIMITATIONS: carrying, lifting, bending, sitting, standing, squatting, stairs, transfers, bed mobility, bathing, dressing, reach over head, hygiene/grooming, locomotion level, and caring for others  PARTICIPATION LIMITATIONS: meal prep, cleaning, laundry, interpersonal relationship, driving, shopping, community activity, occupation, and yard work  PERSONAL FACTORS: Past/current experiences, Transportation, and 1-2 comorbidities: migraines, pelvic floor dysfunction/pain  are also affecting patient's functional outcome.   REHAB POTENTIAL: Good  CLINICAL DECISION MAKING: Stable/uncomplicated  EVALUATION COMPLEXITY: Low   GOALS: Goals reviewed with patient? No  SHORT TERM GOALS: Target date: 06/16/2023  Patient will be independent with initial home exercise program for self-management of symptoms. Baseline: Initial HEP provided at IE (06/02/23); Currently participating well in HEP (07/20/23); Goal status: In-progress   LONG TERM GOALS: Target date: 08/25/2023  Patient will be independent with a long-term home exercise program for self-management of symptoms.   Baseline: Initial HEP provided at IE (06/02/23); Currently participating well in HEP (07/20/23); Goal status: In-progress  2.  Patient will demonstrate improved FOTO to equal or greater than 68 by visit #15 to demonstrate improvement in overall condition and self-reported functional ability.  Baseline: 38 (06/02/23); 69 (07/20/23); Goal status: Met  3.  Patient will demonstrate the ability to ambulate equal or greater than 1300 feet on 6 Minute Walk Test with no gait deviations to improve her ability to complete community mobility.  Baseline: not tested - pt NWB on crutches at initial eval (06/02/23); 1066 feet while pushing stroller to comfort baby (07/20/23); Goal status: In-progress  4.  Patient will demonstrate L ankle PROM within 90% of right ankle PROM to improve her ability to ambulate normally and complete stairs.  Baseline: Initial AROM measured at left ankle significantly less than right  (06/02/23); limited compared to R  - see objective (07/20/2023);  Goal status: In-progress  5.  Patient will demonstrate improvement in Patient Specific Functional Scale (PSFS) of equal or greater than 3 points to reflect clinically significant improvement in patient's most valued functional activities. Baseline: to be measured visit #2 as appropriate (06/02/23); 1.33 (06/09/2023); 6 (07/20/23); Goal status: Met  6.  Patient will demonstrate the ability to perform 20 single leg heel raises on L LE to help normalize gait pattern and improve her ability to return to her exercise routine, complete stairs, and care for her children safely and effectively.  Baseline: unable to weight bear on L LE (06/02/23); able to perform 5 single leg heel raises on L LE (07/20/23); Goal status: In-progress    PLAN:  PT FREQUENCY: 1-2x/week  PT DURATION: 12 weeks  PLANNED INTERVENTIONS: 97164- PT Re-evaluation, 97110-Therapeutic exercises, 97530- Therapeutic activity, 97112- Neuromuscular re-education, 97535- Self  Care, 13086- Manual therapy, [phone]- Gait training, 97014- Electrical stimulation (unattended), Patient/Family education, Balance training, Stair training, Dry Needling, Joint mobilization, Spinal mobilization, Cryotherapy, and Moist heat  PLAN FOR NEXT SESSION: update HEP as appropriate, progressive LE/functional strengthening/ROM/balance as appropriate. Normalize weight bearing and gait mechanics within physician's and tissue healing limitations. Education.    Nimrit Kehres [place], SPT [employer] DPTE   Huntley Dec R. Ilsa Iha, PT, DPT 08/16/23, 1:03 PM  [facility] [address] [place], [place] [address] P: [phone] I F: [phone]

## 2023-08-18 ENCOUNTER — Ambulatory Visit: Payer: Medicaid Other | Admitting: Physical Therapy

## 2023-08-24 ENCOUNTER — Encounter: Payer: Medicaid Other | Admitting: Physical Therapy

## 2023-08-25 ENCOUNTER — Telehealth: Payer: Medicaid Other | Admitting: Physician Assistant

## 2023-08-25 DIAGNOSIS — R3989 Other symptoms and signs involving the genitourinary system: Secondary | ICD-10-CM

## 2023-08-25 MED ORDER — CEPHALEXIN 500 MG PO CAPS: 500.0000 mg | ORAL_CAPSULE | Freq: Two times a day (BID) | ORAL | 0 refills | Status: AC

## 2023-08-25 NOTE — Progress Notes (Signed)

## 2023-08-25 NOTE — Progress Notes (Signed)
 I have spent 5 minutes in review of e-visit questionnaire, review and updating patient chart, medical decision making and response to patient.   Piedad Climes, PA-C

## 2023-08-26 ENCOUNTER — Encounter: Payer: Medicaid Other | Admitting: Physical Therapy

## 2023-12-14 ENCOUNTER — Encounter: Payer: Self-pay | Admitting: Podiatry

## 2023-12-14 ENCOUNTER — Ambulatory Visit (INDEPENDENT_AMBULATORY_CARE_PROVIDER_SITE_OTHER)

## 2023-12-14 ENCOUNTER — Ambulatory Visit: Admitting: Podiatry

## 2023-12-14 ENCOUNTER — Other Ambulatory Visit: Payer: Self-pay | Admitting: Podiatry

## 2023-12-14 DIAGNOSIS — M7752 Other enthesopathy of left foot: Secondary | ICD-10-CM

## 2023-12-14 DIAGNOSIS — S82892A Other fracture of left lower leg, initial encounter for closed fracture: Secondary | ICD-10-CM

## 2023-12-14 NOTE — Progress Notes (Signed)
 Subjective:  Patient ID: Olivia Frost, female    DOB: 1990-08-28,  MRN: [id number]  Chief Complaint  Patient presents with   Foot Pain    Left ankle pain Pt stated that she has been having a lot of swelling and tenderness with her ankle     33 y.o. female presents with the above complaint.  Patient presents with left ankle pain.  She states she was doing better but she states that recently she has been getting a lot of swelling and tenderness with her ankle.  Wanted to get it evaluated she does good amount of stuff on her feet while taking care of her baby.  She denies any other acute complaints wanted discuss treatment options for this.  She has a history of ankle fracture surgery that was done by me in November of last year   Review of Systems: Negative except as noted in the HPI. Denies N/V/F/Ch.  Past Medical History:  Diagnosis Date   Anemia during pregnancy in third trimester 10/09/2022   COVID 03/26/2022   Migraines    Obesity in pregnancy 09/03/2022   Supervision of other normal pregnancy, antepartum 04/22/2022              Clinical Staff    Provider      Office Location     [facility]    Dating     11/21/2022, by Patient Reported         Language     English    Anatomy US      Normal       Flu Vaccine     offer    Genetic Screen     NIPS: Negative      TDaP vaccine      09/03/22    Hgb A1C or   GTT    Early :  Third trimester : 54      Covid    Has booster         LAB RESULTS       Rhogam     B/Positiv   SVD (spontaneous vaginal delivery) 10/07/2022   Trimalleolar fracture 03/2023   left    Current Outpatient Medications:    acetaminophen  (TYLENOL ) 500 MG tablet, Take 1,000 mg by mouth every 8 (eight) hours as needed (pain.)., Disp: , Rfl:    cyclobenzaprine  (FLEXERIL ) 10 MG tablet, Take 1 tablet (10 mg total) by mouth 3 (three) times daily as needed for muscle spasms. (Patient not taking: Reported on 07/08/2023), Disp: 30 tablet, Rfl: 0   ibuprofen  (ADVIL ) 800 MG  tablet, Take 800 mg by mouth every 8 (eight) hours as needed (pain.). (Patient not taking: Reported on 07/08/2023), Disp: , Rfl:    ibuprofen  (ADVIL ) 800 MG tablet, Take 1 tablet (800 mg total) by mouth every 6 (six) hours as needed. (Patient not taking: Reported on 07/08/2023), Disp: 60 tablet, Rfl: 1   oxyCODONE  (ROXICODONE ) 5 MG immediate release tablet, Take 1 tablet (5 mg total) by mouth every 8 (eight) hours as needed. (Patient not taking: Reported on 07/08/2023), Disp: 10 tablet, Rfl: 0   oxyCODONE -acetaminophen  (PERCOCET) 5-325 MG tablet, Take 1 tablet by mouth every 4 (four) hours as needed for severe pain (pain score 7-10). (Patient not taking: Reported on 07/08/2023), Disp: 30 tablet, Rfl: 0  Social History   Tobacco Use  Smoking Status Never  Smokeless Tobacco Never    Allergies  Allergen Reactions   Latex Rash   Objective:  There were no  vitals filed for this visit. There is no height or weight on file to calculate BMI. Constitutional Well developed. Well nourished.  Vascular Dorsalis pedis pulses palpable bilaterally. Posterior tibial pulses palpable bilaterally. Capillary refill normal to all digits.  No cyanosis or clubbing noted. Pedal hair growth normal.  Neurologic Normal speech. Oriented to person, place, and time. Epicritic sensation to light touch grossly present bilaterally.  Dermatologic Nails well groomed and normal in appearance. No open wounds. No skin lesions.  Orthopedic: Pain on palpation to the left medial ankle gutter pain with range of motion of the ankle joint with mild deep intra-articular pain noted no crepitus noted.  No pain at the Achilles tendon posterior tibial tendon peroneal tendon   Radiographs: 3 views of skeletally mature adult left ankle: Good correction alignment noted no signs of varus osteoarthritis noted no hardware failure noted. Assessment:   1. Closed fracture of left ankle, initial encounter   2. Capsulitis of left ankle    Plan:   Patient was evaluated and treated and all questions answered.  Left ankle capsulitis with history of ankle fracture - All questions and concerns were discussed with the patient in extensive detail given the amount of pain that she is experiencing she would benefit from steroid injection to help decrease inflammatory component associated pain.  Patient agrees with plan like to proceed with steroid injection -A steroid injection was performed at left ankle capsulitis  using 1% plain Lidocaine  and 10 mg of Kenalog. This was well tolerated.   No follow-ups on file.

## 2023-12-20 ENCOUNTER — Telehealth: Payer: Self-pay | Admitting: Podiatry

## 2023-12-20 NOTE — Telephone Encounter (Signed)
 [employer] lft mess. S/w Alex and advised [facility] 12/14/23-01/11/24 and will reaccess then. They needed confirmation of her being out of work.

## 2023-12-21 ENCOUNTER — Telehealth: Payer: Self-pay | Admitting: Podiatry

## 2023-12-21 ENCOUNTER — Telehealth: Admitting: Physician Assistant

## 2023-12-21 DIAGNOSIS — R3989 Other symptoms and signs involving the genitourinary system: Secondary | ICD-10-CM

## 2023-12-21 DIAGNOSIS — Z0271 Encounter for disability determination: Secondary | ICD-10-CM

## 2023-12-21 MED ORDER — CEPHALEXIN 500 MG PO CAPS: 500.0000 mg | ORAL_CAPSULE | Freq: Two times a day (BID) | ORAL | 0 refills | Status: AC

## 2023-12-21 NOTE — Telephone Encounter (Signed)
 Recd form from [employer]

## 2023-12-21 NOTE — Progress Notes (Signed)
 I have spent 5 minutes in review of e-visit questionnaire, review and updating patient chart, medical decision making and response to patient.   Piedad Climes, PA-C

## 2023-12-21 NOTE — Progress Notes (Signed)

## 2024-01-11 ENCOUNTER — Ambulatory Visit: Admitting: Podiatry

## 2024-01-11 DIAGNOSIS — S82892A Other fracture of left lower leg, initial encounter for closed fracture: Secondary | ICD-10-CM | POA: Diagnosis not present

## 2024-01-11 MED ORDER — OXYCODONE-ACETAMINOPHEN 5-325 MG PO TABS: 1.0000 | ORAL_TABLET | ORAL | 0 refills | Status: DC | PRN

## 2024-01-11 NOTE — Progress Notes (Signed)
 Subjective:  Patient ID: Olivia Frost, female    DOB: Jul 06, 1990,  MRN: [id number]  Chief Complaint  Patient presents with   Foot Pain    Pt stated that she is still having pain with her ankle she stated that the injection only helped a few days     33 y.o. female presents with the above complaint.  Patient presents with left ankle pain.  She states the injection helped a little bit.  She wants to discuss next treatment plan still bothers her.  She is able to manage it.  Review of Systems: Negative except as noted in the HPI. Denies N/V/F/Ch.  Past Medical History:  Diagnosis Date   Anemia during pregnancy in third trimester 10/09/2022   COVID 03/26/2022   Migraines    Obesity in pregnancy 09/03/2022   Supervision of other normal pregnancy, antepartum 04/22/2022              Clinical Staff    Provider      Office Location     [facility]    Dating     11/21/2022, by Patient Reported         Language     English    Anatomy US      Normal       Flu Vaccine     offer    Genetic Screen     NIPS: Negative      TDaP vaccine      09/03/22    Hgb A1C or   GTT    Early :  Third trimester : 79      Covid    Has booster         LAB RESULTS       Rhogam     B/Positiv   SVD (spontaneous vaginal delivery) 10/07/2022   Trimalleolar fracture 03/2023   left    Current Outpatient Medications:    oxyCODONE -acetaminophen  (PERCOCET) 5-325 MG tablet, Take 1 tablet by mouth every 4 (four) hours as needed for severe pain (pain score 7-10)., Disp: 30 tablet, Rfl: 0   acetaminophen  (TYLENOL ) 500 MG tablet, Take 1,000 mg by mouth every 8 (eight) hours as needed (pain.)., Disp: , Rfl:    cyclobenzaprine  (FLEXERIL ) 10 MG tablet, Take 1 tablet (10 mg total) by mouth 3 (three) times daily as needed for muscle spasms. (Patient not taking: Reported on 07/08/2023), Disp: 30 tablet, Rfl: 0   ibuprofen  (ADVIL ) 800 MG tablet, Take 800 mg by mouth every 8 (eight) hours as needed (pain.). (Patient not taking: Reported  on 07/08/2023), Disp: , Rfl:    ibuprofen  (ADVIL ) 800 MG tablet, Take 1 tablet (800 mg total) by mouth every 6 (six) hours as needed. (Patient not taking: Reported on 07/08/2023), Disp: 60 tablet, Rfl: 1   oxyCODONE  (ROXICODONE ) 5 MG immediate release tablet, Take 1 tablet (5 mg total) by mouth every 8 (eight) hours as needed. (Patient not taking: Reported on 07/08/2023), Disp: 10 tablet, Rfl: 0   oxyCODONE -acetaminophen  (PERCOCET) 5-325 MG tablet, Take 1 tablet by mouth every 4 (four) hours as needed for severe pain (pain score 7-10). (Patient not taking: Reported on 07/08/2023), Disp: 30 tablet, Rfl: 0  Social History   Tobacco Use  Smoking Status Never  Smokeless Tobacco Never    Allergies  Allergen Reactions   Latex Rash   Objective:  There were no vitals filed for this visit. There is no height or weight on file to calculate BMI. Constitutional Well developed. Well nourished.  Vascular Dorsalis pedis pulses palpable bilaterally. Posterior tibial pulses palpable bilaterally. Capillary refill normal to all digits.  No cyanosis or clubbing noted. Pedal hair growth normal.  Neurologic Normal speech. Oriented to person, place, and time. Epicritic sensation to light touch grossly present bilaterally.  Dermatologic Nails well groomed and normal in appearance. No open wounds. No skin lesions.  Orthopedic: Pain on palpation to the left medial ankle gutter pain with range of motion of the ankle joint with mild deep intra-articular pain noted no crepitus noted.  No pain at the Achilles tendon posterior tibial tendon peroneal tendon   Radiographs: 3 views of skeletally mature adult left ankle: Good correction alignment noted no signs of varus osteoarthritis noted no hardware failure noted. Assessment:   1. Closed fracture of left ankle, initial encounter    Plan:  Patient was evaluated and treated and all questions answered.  Left ankle capsulitis with history of ankle fracture - All  questions and concerns were discussed with the patient in extensive detail clinically steroid injection did not give her as much relief only for a couple of days.  For now patient will benefit from physical therapy physical therapy was ordered. -Percocet was dispensed for pain control No follow-ups on file.

## 2024-01-17 ENCOUNTER — Encounter: Payer: Self-pay | Admitting: Physical Therapy

## 2024-01-17 ENCOUNTER — Ambulatory Visit: Attending: Podiatry

## 2024-01-17 DIAGNOSIS — M6281 Muscle weakness (generalized): Secondary | ICD-10-CM | POA: Diagnosis present

## 2024-01-17 DIAGNOSIS — R262 Difficulty in walking, not elsewhere classified: Secondary | ICD-10-CM | POA: Insufficient documentation

## 2024-01-17 DIAGNOSIS — S82892A Other fracture of left lower leg, initial encounter for closed fracture: Secondary | ICD-10-CM | POA: Insufficient documentation

## 2024-01-17 DIAGNOSIS — M25672 Stiffness of left ankle, not elsewhere classified: Secondary | ICD-10-CM | POA: Insufficient documentation

## 2024-01-17 DIAGNOSIS — M25572 Pain in left ankle and joints of left foot: Secondary | ICD-10-CM | POA: Diagnosis present

## 2024-01-17 NOTE — Therapy (Signed)
 OUTPATIENT PHYSICAL THERAPY LOWER EXTREMITY EVALUATION   Patient Name: Olivia Frost MRN: [id number] DOB:1990/09/10, 33 y.o., female Today's Date: 01/17/2024  END OF SESSION:  PT End of Session - 01/17/24 2044     Visit Number 1    Number of Visits 13    Date for PT Re-Evaluation 02/28/24    PT Start Time 1350    PT Stop Time 1430    PT Time Calculation (min) 40 min    Activity Tolerance Patient tolerated treatment well    Behavior During Therapy [facility] for tasks assessed/performed          Past Medical History:  Diagnosis Date   Anemia during pregnancy in third trimester 10/09/2022   COVID 03/26/2022   Migraines    Obesity in pregnancy 09/03/2022   Supervision of other normal pregnancy, antepartum 04/22/2022              Clinical Staff    Provider      Office Location      Ob/Gyn    Dating     11/21/2022, by Patient Reported         Language     English    Anatomy US      Normal       Flu Vaccine     offer    Genetic Screen     NIPS: Negative      TDaP vaccine      09/03/22    Hgb A1C or   GTT    Early :  Third trimester : 74      Covid    Has booster         LAB RESULTS       Rhogam     B/Positiv   SVD (spontaneous vaginal delivery) 10/07/2022   Trimalleolar fracture 03/2023   left   Past Surgical History:  Procedure Laterality Date   ORIF ANKLE FRACTURE Left 05/04/2023   Procedure: OPEN REDUCTION INTERNAL FIXATION (ORIF) ANKLE FRACTURE;  Surgeon: Tobie Franky SQUIBB, DPM;  Location: [facility] ORS;  Service: Orthopedics/Podiatry;  Laterality: Left;  POPLITEAL/SAPHENOUS BLOCK   WRIST SURGERY Left    Patient Active Problem List   Diagnosis Date Noted   Intrauterine device 01/11/2023    PCP: None listed  REFERRING PROVIDER: Tobie Franky SQUIBB, DPM  REFERRING DIAG: [phone] (ICD-10-CM) - Closed fracture of left ankle, initial encounter  THERAPY DIAG:  Pain in left ankle and joints of left foot  Stiffness of left ankle, not elsewhere classified  Muscle weakness  (generalized)  Difficulty in walking, not elsewhere classified  Rationale for Evaluation and Treatment: Rehabilitation  ONSET DATE: 05/04/23  SUBJECTIVE:   SUBJECTIVE STATEMENT: Pt returning to PT after worsening L ankle pain from ankle surgery in November 2024.   PERTINENT HISTORY: Pt reports continued L ankle pain since being done with PT last time from L ankle fx. Pain has gradually worsened. Saw MD and had X-ray with no changes to her hardware. Has tried some of her old HEP and has helped some with her ROM. Reports L ankle pronation in stance phase due to weakness she thinks. Most of her pain is medial at malleolus and superior at [facility] joint. Mild pain laterally but not as much. Average pain 8/10 NPS. Good day is 6/10 NPS. Currently 8/10 NPS. Pain quality described as an ache. Pain worsens with standing, walking. Has better symptom management in morning after sleeping due to being off her feet. Pain has been so bad she  has needed help to get up off the couch. Difficulty navigating stairs.   PAIN:  Are you having pain? Yes: NPRS scale: 8/10 NPS Pain location: medial malleolus/ superior TC joint Pain description: dull ache Aggravating factors: standing, walking, stairs Relieving factors: rest  PRECAUTIONS: None  RED FLAGS: None   WEIGHT BEARING RESTRICTIONS: No  FALLS:  Has patient fallen in last 6 months? 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  DIAGNOSTIC FINDINGS: X-ray. Reports negative findings  PATIENT SURVEYS:  FADI: 25/50 = 50%   COGNITION: Overall cognitive status: Within functional  limits for tasks assessed     SENSATION: WFL  EDEMA:  Figure 8: R/L: 61.5 cm/ 62 cm   POSTURE: No Significant postural limitations  PALPATION: TTP L medial malleolus and TC joint superiorly  LOWER EXTREMITY ROM:  Active ROM Right eval Left eval  Hip flexion    Hip extension    Hip abduction    Hip adduction    Hip internal rotation    Hip external rotation    Knee flexion    Knee extension    Ankle dorsiflexion 3 0  Ankle plantarflexion 50 57  Ankle inversion 25 10*  Ankle eversion 16 20   (Blank rows = not tested)  LOWER EXTREMITY MMT:  MMT Right eval Left eval  Hip flexion 5 4  Hip extension 4 3+  Hip abduction 4 3+  Hip adduction    Hip internal rotation 4 4  Hip external rotation 5 5  Knee flexion 5 5  Knee extension 5 5  Ankle dorsiflexion 5 4  Ankle plantarflexion 15 reps 10 reps*  Ankle inversion  3+*  Ankle eversion  4*   (Blank rows = not tested)  LOWER EXTREMITY SPECIAL TESTS:  Not Indicated  JOINT MOBILITY: Ankle AP TC joint: R normal mobility/ L hypomobile and painful Subtalar: Normal and painless bilat  FUNCTIONAL TESTS:  5 times sit to stand: deferred to next session 6 minute walk test: deferred to next session  GAIT: Distance walked: 10 meters Assistive device utilized: None Level of assistance: Complete Independence Comments: L ankle pronation in stance phase. Antalgic on LLE. Decreased terminal stance on LLE.                                                                                                                                TREATMENT DATE: 01/17/24 Eval only    PATIENT EDUCATION:  Education details: HEP, POC Person educated: Patient Education method: Chief Technology Officer Education comprehension: verbalized understanding  HOME EXERCISE PROGRAM: Access Code: [email or web address] URL: [email or web address]  Date: 01/17/2024 Prepared by: Dorina Kingfisher  Exercises - Prone Hip Extension  - 1 x daily - 7 x  weekly - 3 sets - 8 reps - Sidelying Hip Abduction  - 1 x daily - 7 x weekly - 3 sets - 8 reps - Supine Ankle Circles  - 1 x daily - 7 x weekly - 2 sets - 15 reps - Long Sitting Calf Stretch with Strap  - 1 x daily - 7 x weekly - 1 sets - 3 reps - 30 hold  ASSESSMENT:  CLINICAL IMPRESSION: Patient is a 33 y.o. female who was seen today for physical therapy evaluation and treatment for L ankle pain. Pt known to clinic returning with continued deficits in L ankle ROM, joint hypomobility, pain, weakness, and abnormal gait mechanics along with edema. Pt also with significant proximal LLE hip weakness compared to RLE likely from continued difficulties in recovery since her surgery and prior rehab. These deficits are leading to significant pain in standing, walking, and completing stairs and inability to recreationally return to gym. Pt will benefit from skilled PT services to address these deficits and maximize return to PLOF.   OBJECTIVE IMPAIRMENTS: Abnormal gait, decreased activity tolerance, decreased mobility, difficulty walking, decreased ROM, decreased strength, hypomobility, increased edema, and pain.   ACTIVITY LIMITATIONS: lifting, standing, squatting, stairs, transfers, and locomotion level  PARTICIPATION LIMITATIONS: cleaning, laundry, and community activity  PERSONAL FACTORS: Age, Behavior pattern, Fitness, Past/current experiences, and Time since onset of injury/illness/exacerbation are also affecting patient's functional outcome.   REHAB POTENTIAL: Fair chronic symptoms  CLINICAL DECISION MAKING: Evolving/moderate complexity  EVALUATION COMPLEXITY: Moderate   GOALS: Goals reviewed with patient? No  SHORT TERM GOALS: Target date: 02/07/24 Pt will be independent with HEP to improve L ankle ROM, pain, and strength.  Baseline: 01/17/24: Provided HEP Goal status: INITIAL Goal status: INITIAL  LONG TERM GOALS: Target date: 02/28/24  Pt will improve FADI by at least 5 points to  demonstrate clinically significant improvement in disability with ADL's.  Baseline: 01/17/24: 25/50 = 50% Goal status: INITIAL  2.  Pt will improve 6 MWT by at least 150' to demonstrate clinically significant improved tolerance for community ambulation distances.  Baseline: 01/17/24: deferred to next session Goal status: INITIAL  3.  Pt's worst pain will be 6/10 NPS or less to demo clinically significant reduction in standing, and walking ADL's. Baseline: 01/17/24: 8/10 NPS Goal status: INITIAL  PLAN:  PT FREQUENCY: 1-2x/week  PT DURATION: 6 weeks  PLANNED INTERVENTIONS: 97164- PT Re-evaluation, 97110-Therapeutic exercises, 97530- Therapeutic activity, 97112- Neuromuscular re-education, 97535- Self Care, 02859- Manual therapy, [id number]- Gait training, [phone]- Orthotic Initial, [phone]- Orthotic/Prosthetic subsequent, H9716- Electrical stimulation (unattended), [phone]- Electrical stimulation (manual), [phone] (1-2 muscles), [id number] (3+ muscles)- Dry Needling, Patient/Family education, Balance training, Stair training, Taping, Joint mobilization, Joint manipulation, Spinal manipulation, Spinal mobilization, Cryotherapy, and Moist heat  PLAN FOR NEXT SESSION: 5xSTS, 6 MWT, review HEP. L ankle ROM and strengthening of LLE   Dorina HERO. Fairly IV, PT, DPT Physical Therapist- [place]  [facility] 01/17/2024, 8:45 PM

## 2024-01-18 ENCOUNTER — Telehealth: Payer: Self-pay | Admitting: Podiatry

## 2024-01-18 ENCOUNTER — Encounter: Payer: Self-pay | Admitting: Podiatry

## 2024-01-18 NOTE — Telephone Encounter (Signed)
 pt lft mess on my vmail. I called her bck and needs letter Dr.Patel did faxed to [employer] [phone] and emailed to her  RTW 04/11/24

## 2024-01-19 ENCOUNTER — Other Ambulatory Visit: Payer: Self-pay | Admitting: Medical Genetics

## 2024-01-19 ENCOUNTER — Encounter: Admitting: Physical Therapy

## 2024-01-31 NOTE — Therapy (Deleted)
 OUTPATIENT PHYSICAL THERAPY TREATMENT   Patient Name: Olivia Frost MRN: [id number] DOB:1990-08-12, 33 y.o., female Today's Date: 01/31/2024  END OF SESSION:    Past Medical History:  Diagnosis Date   Anemia during pregnancy in third trimester 10/09/2022   COVID 03/26/2022   Migraines    Obesity in pregnancy 09/03/2022   Supervision of other normal pregnancy, antepartum 04/22/2022              Clinical Staff    Provider      Office Location     [facility]    Dating     11/21/2022, by Patient Reported         Language     English    Anatomy US      Normal       Flu Vaccine     offer    Genetic Screen     NIPS: Negative      TDaP vaccine      09/03/22    Hgb A1C or   GTT    Early :  Third trimester : 8      Covid    Has booster         LAB RESULTS       Rhogam     B/Positiv   SVD (spontaneous vaginal delivery) 10/07/2022   Trimalleolar fracture 03/2023   left   Past Surgical History:  Procedure Laterality Date   ORIF ANKLE FRACTURE Left 05/04/2023   Procedure: OPEN REDUCTION INTERNAL FIXATION (ORIF) ANKLE FRACTURE;  Surgeon: Tobie Franky SQUIBB, DPM;  Location: [facility] ORS;  Service: Orthopedics/Podiatry;  Laterality: Left;  POPLITEAL/SAPHENOUS BLOCK   WRIST SURGERY Left    Patient Active Problem List   Diagnosis Date Noted   Intrauterine device 01/11/2023    PCP: None listed  REFERRING PROVIDER: Tobie Franky SQUIBB, DPM  REFERRING DIAG: [phone] (ICD-10-CM) - Closed fracture of left ankle, initial encounter  THERAPY DIAG:  No diagnosis found.  Rationale for Evaluation and Treatment: Rehabilitation  ONSET DATE: 05/04/23  SUBJECTIVE:    PERTINENT HISTORY: Pt reports continued L ankle pain since being done with PT last time from L ankle fx. Pain has gradually worsened. Saw MD and had X-ray with no changes to her hardware. Has tried some of her old HEP and has helped some with her ROM. Reports L ankle pronation in stance phase due to weakness she thinks. Most of her pain is medial  at malleolus and superior at [facility] joint. Mild pain laterally but not as much. Average pain 8/10 NPS. Good day is 6/10 NPS. Currently 8/10 NPS. Pain quality described as an ache. Pain worsens with standing, walking. Has better symptom management in morning after sleeping due to being off her feet. Pain has been so bad she has needed help to get up off the couch. Difficulty navigating stairs.   SUBJECTIVE STATEMENT: Pt returning to PT after worsening L ankle pain from ankle surgery in November 2024.  PAIN:  Are you having pain? Yes:  NPRS scale: 8/10 NPS  From initial eval:  Pain location: medial malleolus/ superior TC joint Pain description: dull ache Aggravating factors: standing, walking, stairs Relieving factors: rest  PRECAUTIONS: None  RED FLAGS: None   WEIGHT BEARING RESTRICTIONS: No  PATIENT GOALS: Improve pain; return to working out  NEXT MD VISIT: N/A  OBJECTIVE:   PASSIVE RANGE OF MOTION (PROM) *Indicates pain 02/01/24 Date Date  Joint/Motion R/L R/L R/L  Hip     Flexion  / / /  Extension  / / /  Abduction / / /  Adduction / / /  External rotation / / /  Internal rotation  / / /  Knee     Extension / / /  Flexion / / /  Ankle/Foot     Dorsiflexion (knee ext) / / /  Dorsiflexion (knee flex) / / /  Plantarflexion / / /  [place]ison / / /  Inversion / / /  Great toe extension / / /  Great toe flexion / / /  Comments:   FUNCTIONAL/BALANCE TESTS  6-Minute Walk Test ( ): Purpose: to assess function with community mobility Results: *** feet with ***.  *** Analysis: patient ambulated *** their long term goal of *** feet, which is the age matched norm for a healthy community dwelling adult ***.                                                                                                                               TREATMENT Physical Performance Test or Measurement: a physical performance test or measurement with written report.  PATIENT EDUCATION:   Education details: HEP, POC Person educated: Patient Education method: Chief Technology Officer Education comprehension: verbalized understanding  HOME EXERCISE PROGRAM: Access Code: [email or web address] URL: [email or web address] Date: 01/17/2024 Prepared by: Dorina Kingfisher  Exercises - Prone Hip Extension  - 1 x daily - 7 x weekly - 3 sets - 8 reps - Sidelying Hip Abduction  - 1 x daily - 7 x weekly - 3 sets - 8 reps - Supine Ankle Circles  - 1 x daily - 7 x weekly - 2 sets - 15 reps - Long Sitting Calf Stretch with Strap  - 1 x daily - 7 x weekly - 1 sets - 3 reps - 30 hold  ASSESSMENT:  CLINICAL IMPRESSION: ***  From initial PT evaluation 01/17/2024:  Patient is a 33 y.o. female who was seen today for physical therapy evaluation and treatment for L ankle pain. Pt known to clinic returning with continued deficits in L ankle ROM, joint hypomobility, pain, weakness, and abnormal gait mechanics along with edema. Pt also with significant proximal LLE hip weakness compared to RLE likely from continued difficulties in recovery since her surgery and prior rehab. These deficits are leading to significant pain in standing, walking, and completing stairs and inability to recreationally return to gym. Pt will benefit from skilled PT services to address these deficits and maximize return to PLOF.   OBJECTIVE IMPAIRMENTS: Abnormal gait, decreased activity tolerance, decreased mobility, difficulty walking, decreased ROM, decreased strength, hypomobility, increased edema, and pain.   ACTIVITY LIMITATIONS: lifting, standing, squatting, stairs, transfers, and locomotion level  PARTICIPATION LIMITATIONS: cleaning, laundry, and community activity  PERSONAL FACTORS: Age, Behavior pattern, Fitness, Past/current experiences, and Time since onset of injury/illness/exacerbation are also affecting patient's functional outcome.   REHAB POTENTIAL: Fair chronic symptoms  CLINICAL DECISION MAKING:  Evolving/moderate complexity  EVALUATION COMPLEXITY: Moderate  GOALS: Goals reviewed with patient? No  SHORT TERM GOALS: Target date: 02/07/24 Pt will be independent with HEP to improve L ankle ROM, pain, and strength.  Baseline: 01/17/24: Provided HEP Goal status: INITIAL Goal status: INITIAL  LONG TERM GOALS: Target date: 02/28/24  Pt will improve FADI by at least 5 points to demonstrate clinically significant improvement in disability with ADL's.  Baseline: 01/17/24: 25/50 = 50% Goal status: INITIAL  2.  Pt will improve 6 MWT by at least 150' to demonstrate clinically significant improved tolerance for community ambulation distances.  Baseline: 01/17/24: deferred to next session Goal status: INITIAL  3.  Pt's worst pain will be 6/10 NPS or less to demo clinically significant reduction in standing, and walking ADL's. Baseline: 01/17/24: 8/10 NPS Goal status: INITIAL  PLAN:  PT FREQUENCY: 1-2x/week  PT DURATION: 6 weeks  PLANNED INTERVENTIONS: 97164- PT Re-evaluation, 97110-Therapeutic exercises, 97530- Therapeutic activity, 97112- Neuromuscular re-education, 97535- Self Care, 02859- Manual therapy, [id number]- Gait training, [phone]- Orthotic Initial, [phone]- Orthotic/Prosthetic subsequent, H9716- Electrical stimulation (unattended), [phone]- Electrical stimulation (manual), [phone] (1-2 muscles), 20561 (3+ muscles)- Dry Needling, Patient/Family education, Balance training, Stair training, Taping, Joint mobilization, Joint manipulation, Spinal manipulation, Spinal mobilization, Cryotherapy, and Moist heat  PLAN FOR NEXT SESSION: 5xSTS, 6 MWT, review HEP. L ankle ROM and strengthening of LLE   Camie SAUNDERS. Juli, PT, DPT, Cert. MDT 01/31/24, 1:09 PM  [facility] [address] [place], [place] [address] P: [phone] I F: [phone]

## 2024-02-01 ENCOUNTER — Ambulatory Visit: Attending: Podiatry | Admitting: Physical Therapy

## 2024-02-01 ENCOUNTER — Telehealth: Payer: Self-pay | Admitting: Physical Therapy

## 2024-02-01 DIAGNOSIS — M6281 Muscle weakness (generalized): Secondary | ICD-10-CM | POA: Insufficient documentation

## 2024-02-01 DIAGNOSIS — R262 Difficulty in walking, not elsewhere classified: Secondary | ICD-10-CM | POA: Insufficient documentation

## 2024-02-01 DIAGNOSIS — M25672 Stiffness of left ankle, not elsewhere classified: Secondary | ICD-10-CM | POA: Insufficient documentation

## 2024-02-01 DIAGNOSIS — M25572 Pain in left ankle and joints of left foot: Secondary | ICD-10-CM | POA: Insufficient documentation

## 2024-02-01 NOTE — Telephone Encounter (Signed)
 Attempted to contact patient after she did not come to her PT visit scheduled today at 9:45pm. Number not valid.   Olivia Frost. Juli, PT, DPT 02/01/24, 9:23 AM  [facility] [address] [place], [place] [address] P: [phone] I F: [phone]

## 2024-02-03 ENCOUNTER — Ambulatory Visit: Admitting: Physical Therapy

## 2024-02-03 ENCOUNTER — Encounter: Payer: Self-pay | Admitting: Physical Therapy

## 2024-02-03 VITALS — BP 96/76 | HR 72

## 2024-02-03 DIAGNOSIS — M25572 Pain in left ankle and joints of left foot: Secondary | ICD-10-CM | POA: Diagnosis present

## 2024-02-03 DIAGNOSIS — M6281 Muscle weakness (generalized): Secondary | ICD-10-CM | POA: Diagnosis present

## 2024-02-03 DIAGNOSIS — M25672 Stiffness of left ankle, not elsewhere classified: Secondary | ICD-10-CM

## 2024-02-03 DIAGNOSIS — R262 Difficulty in walking, not elsewhere classified: Secondary | ICD-10-CM | POA: Diagnosis present

## 2024-02-03 NOTE — Therapy (Signed)
 OUTPATIENT PHYSICAL THERAPY TREATMENT   Patient Name: Olivia Frost MRN: [id number] DOB:11-08-1990, 33 y.o., female Today's Date: 02/03/2024  END OF SESSION:  PT End of Session - 02/03/24 1846     Visit Number 2    Number of Visits 13    Date for PT Re-Evaluation 02/28/24    Authorization Type [place] MEDICAID [employer] reporting period from 02/03/2024    Authorization Time Period [employer] auth#[id number] for 12 PT vsts from 8/5-10/4    Authorization - Visit Number 1    Authorization - Number of Visits 13    Progress Note Due on Visit 10    PT Start Time 0904    PT Stop Time 0950    PT Time Calculation (min) 46 min    Activity Tolerance Patient tolerated treatment well    Behavior During Therapy [facility] for tasks assessed/performed           Past Medical History:  Diagnosis Date   Anemia during pregnancy in third trimester 10/09/2022   COVID 03/26/2022   Migraines    Obesity in pregnancy 09/03/2022   Supervision of other normal pregnancy, antepartum 04/22/2022              Clinical Staff    Provider      Office Location     [facility]    Dating     11/21/2022, by Patient Reported         Language     English    Anatomy US      Normal       Flu Vaccine     offer    Genetic Screen     NIPS: Negative      TDaP vaccine      09/03/22    Hgb A1C or   GTT    Early :  Third trimester : 24      Covid    Has booster         LAB RESULTS       Rhogam     B/Positiv   SVD (spontaneous vaginal delivery) 10/07/2022   Trimalleolar fracture 03/2023   left   Past Surgical History:  Procedure Laterality Date   ORIF ANKLE FRACTURE Left 05/04/2023   Procedure: OPEN REDUCTION INTERNAL FIXATION (ORIF) ANKLE FRACTURE;  Surgeon: Tobie Franky SQUIBB, DPM;  Location: [facility] ORS;  Service: Orthopedics/Podiatry;  Laterality: Left;  POPLITEAL/SAPHENOUS BLOCK   WRIST SURGERY Left    Patient Active Problem List   Diagnosis Date Noted   Intrauterine device 01/11/2023    PCP: None listed  REFERRING PROVIDER:  Tobie Franky SQUIBB, DPM  REFERRING DIAG: [phone] (ICD-10-CM) - Closed fracture of left ankle, initial encounter  THERAPY DIAG:  Pain in left ankle and joints of left foot  Stiffness of left ankle, not elsewhere classified  Muscle weakness (generalized)  Difficulty in walking, not elsewhere classified  Rationale for Evaluation and Treatment: Rehabilitation  ONSET DATE: 05/04/23  SUBJECTIVE:    PERTINENT HISTORY: Pt reports continued L ankle pain since being done with PT last time from L ankle fx. Pain has gradually worsened. Saw MD and had X-ray with no changes to her hardware. Has tried some of her old HEP and has helped some with her ROM. Reports L ankle pronation in stance phase due to weakness she thinks. Most of her pain is medial at malleolus and superior at [facility] joint. Mild pain laterally but not as much. Average pain 8/10 NPS. Good day is 6/10 NPS. Currently  8/10 NPS. Pain quality described as an ache. Pain worsens with standing, walking. Has better symptom management in morning after sleeping due to being off her feet. Pain has been so bad she has needed help to get up off the couch. Difficulty navigating stairs.   SUBJECTIVE STATEMENT:   Patient states she does have a history of back problems. She mostly feels it when she is switching positions in bed and late at night when getting up. When she first stands up she can feel a twinge in her back, but when she is walking it wears off. She is still breastfeeding her daughter. Back pain is right in the middle of her low back without known radiation. She is still having pelvic pain that mainly comes when she is having sexual intercourse (this is negatively affecting her sex life). She does notice a connection to her L hip pain with this. She feels it if her legs are raised too high. The position seems to provoke pain more than the penetration. Pain is around the opening, not deep inside. She had slight tearing with birth.   Worse: moving in  bed, mornings, getting up from sitting.  Better: getting up and walk it off  PAIN:  Are you having pain? Yes:  NPRS scale: 8/10 NPS  From initial eval:  Pain location: medial malleolus/ superior TC joint Pain description: dull ache Aggravating factors: standing, walking, stairs Relieving factors: rest  PRECAUTIONS: None  RED FLAGS: None   WEIGHT BEARING RESTRICTIONS: No  PATIENT GOALS: Improve pain; return to working out  NEXT MD VISIT: N/A  OBJECTIVE:   Vitals:   02/03/24 0912  BP: 96/76  Pulse: 72  SpO2: 100%    NEUROLOGIC EXAM:  Slump Flexed:  AROM:  R end range tension in back released by PF L end range (earlier) tension in back and L hip released by neck extension, L hip released by ankle PF. Ankle pain unchanged by any position.  PROM:  same as AROM except L ankle pain decreased by neck extension.   Extended:  AROM:  R pain towards right shoulder, better with ankle PF and neck Ext L burning in left hip, better with PF, no change with neck extension  PROM:  R hip pain; no change with neck extension, better with PF L hip burning, no change with ankle or neck position  SLR from supine:  R had hip pain responsive to foot position.  L ankle better with knee flexion, and adduction. Hip was burning the whole time leg was moved from table. No change in ankle pain when moving from table into SLR.  Hip pain earlier than R side.   SLR from 90/90: R hip pain with knee extension, no change with foot L hip pain the whole time, ankle pain better with knee extension.   DTR: Medial hamstring: slightly more brisk on L compared to R but both 2+ Achilles: 2+ bilaterally Quads: maybe a little more brisk on L compared to right but both 2+  Distraction alleviation in seated with DF: positive (abolished pain)  TREATMENT  Therapeutic  exercise: therapeutic exercises that incorporate ONE parameter at one or more areas of the body to centralize symptoms, develop strength and endurance, range of motion, and flexibility required for successful completion of functional activities.  Vitals for systems review (see above)  Neuromuscular Re-education: a technique or exercise performed with the goal of improving the level of communication between the body and the brain, such as for balance, motor control, muscle activation patterns, coordination, desensitization, quality of muscle contraction, proprioception, and/or kinesthetic sense needed for successful and safe completion of functional activities.   Deeper exam to check for nerve irritation or lumbar radiculopathy contributing to pain presentation at L ankle (see above)  PATIENT EDUCATION:  Education details: Education on diagnosis, prognosis, POC, anatomy and physiology of current condition.  Person educated: Patient Education method: Chief Technology Officer Education comprehension: verbalized understanding  HOME EXERCISE PROGRAM: Access Code: [email or web address] URL: [email or web address] Date: 01/17/2024 Prepared by: Dorina Kingfisher  Exercises - Prone Hip Extension  - 1 x daily - 7 x weekly - 3 sets - 8 reps - Sidelying Hip Abduction  - 1 x daily - 7 x weekly - 3 sets - 8 reps - Supine Ankle Circles  - 1 x daily - 7 x weekly - 2 sets - 15 reps - Long Sitting Calf Stretch with Strap  - 1 x daily - 7 x weekly - 1 sets - 3 reps - 30 hold  ASSESSMENT:  CLINICAL IMPRESSION: Patient examined for lumbar source of ankle pain since she has unexplained lingering of ankle pain. She had positive traction alleviation test (lumbar traction alleviated concordant ankle pain), positive flexed slump for concordant ankle pain, but also paridoxical improved L ankle pain with knee extension in SLR tested from 90/90 position. Pateint's ankle pain appears to have a lumbar contribution and  plan to work at addressing load sensitivity and neural tension sensitivity at the lumbar spine to help relieve her symptoms and restore function. Patient would benefit from continued management of limiting condition by skilled physical therapist to address remaining impairments and functional limitations to work towards stated goals and return to PLOF or maximal functional independence.    From initial PT evaluation 01/17/2024:  Patient is a 33 y.o. female who was seen today for physical therapy evaluation and treatment for L ankle pain. Pt known to clinic returning with continued deficits in L ankle ROM, joint hypomobility, pain, weakness, and abnormal gait mechanics along with edema. Pt also with significant proximal LLE hip weakness compared to RLE likely from continued difficulties in recovery since her surgery and prior rehab. These deficits are leading to significant pain in standing, walking, and completing stairs and inability to recreationally return to gym. Pt will benefit from skilled PT services to address these deficits and maximize return to PLOF.   OBJECTIVE IMPAIRMENTS: Abnormal gait, decreased activity tolerance, decreased mobility, difficulty walking, decreased ROM, decreased strength, hypomobility, increased edema, and pain.   ACTIVITY LIMITATIONS: lifting, standing, squatting, stairs, transfers, and locomotion level  PARTICIPATION LIMITATIONS: cleaning, laundry, and community activity  PERSONAL FACTORS: Age, Behavior pattern, Fitness, Past/current experiences, and Time since onset of injury/illness/exacerbation are also affecting patient's functional outcome.   REHAB POTENTIAL: Fair chronic symptoms  CLINICAL DECISION MAKING: Evolving/moderate complexity  EVALUATION COMPLEXITY: Moderate   GOALS: Goals reviewed with patient? No  SHORT TERM GOALS: Target date: 02/07/24 Pt will be independent with HEP to improve L ankle ROM, pain, and strength.  Baseline: 01/17/24: Provided  HEP Goal status: In progress  LONG TERM GOALS: Target date: 02/28/24  Pt will improve FADI by at least 5 points to demonstrate clinically significant improvement in disability with ADL's.  Baseline: 01/17/24: 25/50 = 50% Goal status: In progress  2.  Pt will improve 6 MWT by at least 150' to demonstrate clinically significant improved tolerance for community ambulation distances.  Baseline: 01/17/24: deferred to next session Goal status: In progress  3.  Pt's worst pain will be 6/10 NPS or less to demo clinically significant reduction in standing, and walking ADL's. Baseline: 01/17/24: 8/10 NPS Goal status: In progress  PLAN:  PT FREQUENCY: 1-2x/week  PT DURATION: 6 weeks  PLANNED INTERVENTIONS: 97164- PT Re-evaluation, 97110-Therapeutic exercises, 97530- Therapeutic activity, [id number]- Neuromuscular re-education, 97535- Self Care, [id number]- Manual therapy, [id number]- Gait training, [phone]- Orthotic Initial, [phone]- Orthotic/Prosthetic subsequent, H9716- Electrical stimulation (unattended), [phone]- Electrical stimulation (manual), [phone] (1-2 muscles), [id number] (3+ muscles)- Dry Needling, Patient/Family education, Balance training, Stair training, Taping, Joint mobilization, Joint manipulation, Spinal manipulation, Spinal mobilization, Cryotherapy, and Moist heat  PLAN FOR NEXT SESSION: 5xSTS, 6 MWT, review HEP. L ankle ROM and strengthening of LLE. Interventions for possible radicular contribution.   Camie SAUNDERS. Juli, PT, DPT, Cert. MDT 02/03/24, 6:54 PM  [facility] [address] [place], [place] [address] P: [phone] I F: [phone]

## 2024-02-08 ENCOUNTER — Ambulatory Visit: Admitting: Physical Therapy

## 2024-02-09 ENCOUNTER — Ambulatory Visit: Admitting: Physical Therapy

## 2024-02-15 ENCOUNTER — Ambulatory Visit: Admitting: Physical Therapy

## 2024-02-15 ENCOUNTER — Telehealth: Payer: Self-pay | Admitting: Physical Therapy

## 2024-02-15 NOTE — Telephone Encounter (Signed)
 Attempted to call patient when she did not show up for her PT appointment scheduled at 3:15pm today. No answer and eventually message that said number invalid or could not be completed.   Camie SAUNDERS. Juli, PT, DPT, Cert. MDT 02/15/24, 3:37 PM  [facility] [address] [place], [place] [address] P: [phone] I F: [phone]

## 2024-02-17 ENCOUNTER — Ambulatory Visit: Admitting: Physical Therapy

## 2024-02-21 ENCOUNTER — Ambulatory Visit: Admitting: Physical Therapy

## 2024-02-23 ENCOUNTER — Ambulatory Visit: Admitting: Physical Therapy

## 2024-02-28 ENCOUNTER — Telehealth: Admitting: Physician Assistant

## 2024-02-28 DIAGNOSIS — N760 Acute vaginitis: Secondary | ICD-10-CM

## 2024-02-28 MED ORDER — METRONIDAZOLE 500 MG PO TABS: 500.0000 mg | ORAL_TABLET | Freq: Two times a day (BID) | ORAL | 0 refills | Status: AC

## 2024-02-28 NOTE — Progress Notes (Signed)

## 2024-02-29 ENCOUNTER — Ambulatory Visit: Admitting: Physical Therapy

## 2024-03-02 ENCOUNTER — Encounter: Admitting: Physical Therapy

## 2024-03-07 ENCOUNTER — Encounter: Admitting: Physical Therapy

## 2024-03-09 ENCOUNTER — Encounter: Admitting: Physical Therapy

## 2024-03-14 ENCOUNTER — Encounter: Admitting: Physical Therapy

## 2024-03-17 ENCOUNTER — Encounter: Payer: Self-pay | Admitting: Family Medicine

## 2024-03-17 ENCOUNTER — Other Ambulatory Visit: Payer: Self-pay | Admitting: Family Medicine

## 2024-03-17 ENCOUNTER — Ambulatory Visit: Admitting: Family Medicine

## 2024-03-17 VITALS — BP 112/68 | HR 79 | Ht 68.0 in | Wt 206.5 lb

## 2024-03-17 DIAGNOSIS — Z7689 Persons encountering health services in other specified circumstances: Secondary | ICD-10-CM | POA: Diagnosis not present

## 2024-03-17 DIAGNOSIS — Z975 Presence of (intrauterine) contraceptive device: Secondary | ICD-10-CM

## 2024-03-17 DIAGNOSIS — Z13220 Encounter for screening for lipoid disorders: Secondary | ICD-10-CM

## 2024-03-17 DIAGNOSIS — N920 Excessive and frequent menstruation with regular cycle: Secondary | ICD-10-CM

## 2024-03-17 DIAGNOSIS — D509 Iron deficiency anemia, unspecified: Secondary | ICD-10-CM

## 2024-03-17 DIAGNOSIS — M25572 Pain in left ankle and joints of left foot: Secondary | ICD-10-CM | POA: Diagnosis not present

## 2024-03-17 DIAGNOSIS — Z1329 Encounter for screening for other suspected endocrine disorder: Secondary | ICD-10-CM

## 2024-03-17 DIAGNOSIS — G8929 Other chronic pain: Secondary | ICD-10-CM

## 2024-03-17 DIAGNOSIS — E538 Deficiency of other specified B group vitamins: Secondary | ICD-10-CM

## 2024-03-17 DIAGNOSIS — Z8781 Personal history of (healed) traumatic fracture: Secondary | ICD-10-CM | POA: Diagnosis not present

## 2024-03-17 DIAGNOSIS — E559 Vitamin D deficiency, unspecified: Secondary | ICD-10-CM

## 2024-03-17 DIAGNOSIS — Z0000 Encounter for general adult medical examination without abnormal findings: Secondary | ICD-10-CM

## 2024-03-17 DIAGNOSIS — R7309 Other abnormal glucose: Secondary | ICD-10-CM

## 2024-03-17 NOTE — Progress Notes (Signed)
 Subjective:    Patient ID: Unknown M Olivia Frost, female    DOB: [birth year], 33 y.o.   MRN: [id number]  Olivia Frost is a 33 y.o. female presenting on 03/17/2024 for Establish Care   HPI  Discussed the use of AI scribe software for clinical note transcription with the patient, who gave verbal consent to proceed.  History of Present Illness   [name] is a 33 year old female who presents with ongoing ankle pain and fatigue.  Fatigue and low energy - Chronic fatigue and low energy - Symptoms impact ability to engage in physical activities and work - Fatigue exacerbated by sleep disturbances and anemia  Iron deficiency anemia and menstrual irregularities - History of iron deficiency anemia since college - Inconsistent use of iron supplements (tablets and gummies) - Heavy menstrual cycles persist despite increased regularity since IUD placement in July 2024 - Considering IUD removal due to concerns about pH balance and continued heavy bleeding - Previously on Nexplanon  History Left Ankle Fracture, 03/2023 Followed by [facility] Dr Tobie, had surgery 04/2023 Still having issues with complications and inflammation She has retained hardware, and questioning if this needs to be replaced or removed She has completed Physical Therapy Limited activity due to the Left Ankle - Pain limits ambulation, stair climbing, jogging, and conditioning exercises  Recent pregnancy 2023  Reduced Energy Fatigued / Tired  Sleep disturbance - Approximately five hours of sleep per night - Sleep interrupted by nursing 66-month-old child and older child's bad dreams - Difficulty settling and falling asleep before midnight - Sleep disruption contributes to daytime fatigue  She is at home with 26 month old, per Podiatry orders, should limit time on her ankle and has remained out of work after her recent maternity leave, and complication with hernia, diastasis recti, pelvic floor,  back Previously office work, Secondary school teacher, sedentary       03/17/2024    9:35 AM  Depression screen PHQ 2/9  Decreased Interest 1  Down, Depressed, Hopeless 1  PHQ - 2 Score 2  Altered sleeping 0  Tired, decreased energy 1  Change in appetite 0  Feeling bad or failure about yourself  0  Trouble concentrating 0  Moving slowly or fidgety/restless 0  Suicidal thoughts 0  PHQ-9 Score 3  Difficult doing work/chores Not difficult at all       03/17/2024    9:36 AM  GAD 7 : Generalized Anxiety Score  Nervous, Anxious, on Edge 1  Control/stop worrying 0  Worry too much - different things 1  Trouble relaxing 1  Restless 0  Easily annoyed or irritable 1  Afraid - awful might happen 0  Total GAD 7 Score 4  Anxiety Difficulty Not difficult at all     Past Medical History:  Diagnosis Date   Anemia during pregnancy in third trimester 10/09/2022   COVID 03/26/2022   Migraines    Obesity in pregnancy 09/03/2022   Supervision of other normal pregnancy, antepartum 04/22/2022              Clinical Staff    Provider      Office Location     [facility]    Dating     11/21/2022, by Patient Reported         Language     English    Anatomy US      Normal       Flu Vaccine     offer    Genetic  Screen     NIPS: Negative      TDaP vaccine      09/03/22    Hgb A1C or   GTT    Early :  Third trimester : 27      Covid    Has booster         LAB RESULTS       Rhogam     B/Positiv   SVD (spontaneous vaginal delivery) 10/07/2022   Trimalleolar fracture 03/2023   left   Past Surgical History:  Procedure Laterality Date   ORIF ANKLE FRACTURE Left 05/04/2023   Procedure: OPEN REDUCTION INTERNAL FIXATION (ORIF) ANKLE FRACTURE;  Surgeon: Tobie Franky SQUIBB, DPM;  Location: [facility] ORS;  Service: Orthopedics/Podiatry;  Laterality: Left;  POPLITEAL/SAPHENOUS BLOCK   WRIST SURGERY Left    Social History   Socioeconomic History   Marital status: Significant Other    Spouse name: Not on file    Number of children: 1   Years of education: 56   Highest education level: Not on file  Occupational History   Occupation: proposal developmental coordinator    Employer: [employer]  Tobacco Use   Smoking status: Never   Smokeless tobacco: Never  Vaping Use   Vaping status: Never Used  Substance and Sexual Activity   Alcohol use: Yes    Comment: occ   Drug use: No   Sexual activity: Yes    Partners: Male    Birth control/protection: I.U.D.  Other Topics Concern   Not on file  Social History Narrative   Not on file   Social Drivers of Health   Financial Resource Strain: Low Risk  (04/22/2022)   Overall Financial Resource Strain (CARDIA)    Difficulty of Paying Living Expenses: Not hard at all  Food Insecurity: No Food Insecurity (11/25/2022)   Hunger Vital Sign    Worried About Running Out of Food in the Last Year: Never true    Ran Out of Food in the Last Year: Never true  Transportation Needs: No Transportation Needs (11/25/2022)   PRAPARE - Administrator, Civil Service (Medical): No    Lack of Transportation (Non-Medical): No  Physical Activity: Insufficiently Active (04/22/2022)   Exercise Vital Sign    Days of Exercise per Week: 2 days    Minutes of Exercise per Session: 10 min  Stress: No Stress Concern Present (04/22/2022)   [employer] of Occupational Health - Occupational Stress Questionnaire    Feeling of Stress : Not at all  Social Connections: Moderately Isolated (04/22/2022)   Social Connection and Isolation Panel    Frequency of Communication with Friends and Family: More than three times a week    Frequency of Social Gatherings with Friends and Family: Three times a week    Attends Religious Services: Never    Active Member of Clubs or Organizations: No    Attends Banker Meetings: Never    Marital Status: Living with partner  Intimate Partner Violence: Not At Risk (11/25/2022)   Humiliation, Afraid, Rape, and Kick questionnaire     Fear of Current or Ex-Partner: No    Emotionally Abused: No    Physically Abused: No    Sexually Abused: No   Family History  Problem Relation Age of Onset   Anxiety disorder Sister    Depression Sister    Breast cancer Maternal Grandmother 79   Sickle cell anemia Paternal Grandmother    Breast cancer Paternal Great-grandmother    Lung  cancer Paternal Great-grandmother    Throat cancer Paternal Great-grandmother    Current Outpatient Medications on File Prior to Visit  Medication Sig   acetaminophen  (TYLENOL ) 500 MG tablet Take 1,000 mg by mouth every 8 (eight) hours as needed (pain.).   ferrous sulfate 324 MG TBEC Take 324 mg by mouth.   No current facility-administered medications on file prior to visit.    Review of Systems Per HPI unless specifically indicated above     Objective:    BP 112/68 (BP Location: Left Arm, Patient Position: Sitting, Cuff Size: Normal)   Pulse 79   Ht 5' 8 (1.727 m)   Wt 206 lb 8 oz (93.7 kg)   SpO2 96%   BMI 31.40 kg/m   Wt Readings from Last 3 Encounters:  03/17/24 206 lb 8 oz (93.7 kg)  05/11/23 227 lb 15.4 oz (103.4 kg)  05/04/23 227 lb 15.3 oz (103.4 kg)    Physical Exam Vitals and nursing note reviewed.  Constitutional:      General: She is not in acute distress.    Appearance: Normal appearance. She is well-developed. She is not diaphoretic.     Comments: Well-appearing, comfortable, cooperative  HENT:     Head: Normocephalic and atraumatic.  Eyes:     General:        Right eye: No discharge.        Left eye: No discharge.     Conjunctiva/sclera: Conjunctivae normal.  Cardiovascular:     Rate and Rhythm: Normal rate.  Pulmonary:     Effort: Pulmonary effort is normal.  Skin:    General: Skin is warm and dry.     Findings: No erythema or rash.  Neurological:     Mental Status: She is alert and oriented to person, place, and time.  Psychiatric:        Mood and Affect: Mood normal.        Behavior: Behavior  normal.        Thought Content: Thought content normal.     Comments: Well groomed, good eye contact, normal speech and thoughts     Results for orders placed or performed in visit on 03/17/24  CBC with Differential/Platelet   Collection Time: 03/17/24 10:12 AM  Result Value Ref Range   WBC 5.6 3.8 - 10.8 Thousand/uL   RBC 3.93 3.80 - 5.10 Million/uL   Hemoglobin 11.4 (L) 11.7 - 15.5 g/dL   HCT 65.0 (L) 64.9 - 54.9 %   MCV 88.8 80.0 - 100.0 fL   MCH 29.0 27.0 - 33.0 pg   MCHC 32.7 32.0 - 36.0 g/dL   RDW 86.6 88.9 - 84.9 %   Platelets 377 140 - 400 Thousand/uL   MPV 9.8 7.5 - 12.5 fL   Neutro Abs 3,382 1,500 - 7,800 cells/uL   Absolute Lymphocytes 1,809 850 - 3,900 cells/uL   Absolute Monocytes 342 200 - 950 cells/uL   Eosinophils Absolute 39 15 - 500 cells/uL   Basophils Absolute 28 0 - 200 cells/uL   Neutrophils Relative % 60.4 %   Total Lymphocyte 32.3 %   Monocytes Relative 6.1 %   Eosinophils Relative 0.7 %   Basophils Relative 0.5 %      Assessment & Plan:   Problem List Items Addressed This Visit     Chronic pain of left ankle   History of fracture of left ankle   Iron deficiency anemia - Primary   Relevant Medications   ferrous sulfate 324 MG TBEC  Other Relevant Orders   Iron, TIBC and Ferritin Panel   CBC with Differential/Platelet (Completed)   TSH   T4, free   Other Visit Diagnoses       Encounter to establish care with new doctor         Menorrhagia with regular cycle       Relevant Orders   TSH   T4, free     IUD (intrauterine device) in place         Vitamin D deficiency       Relevant Orders   VITAMIN D 25 Hydroxy (Vit-D Deficiency, Fractures)     Vitamin B12 deficiency       Relevant Orders   Vitamin B12     Screening for hypothyroidism       Relevant Orders   TSH   T4, free       Establish care Updated Health Maintenance information Encouraged improvement to lifestyle with diet and exercise Goal of weight loss  Iron deficiency  anemia with fatigue Chronic iron deficiency anemia with fatigue, likely due to heavy menstrual bleeding and inconsistent iron supplement intake. Potential vitamin deficiencies and sleep disturbances may contribute. - Order anemia panel including iron studies, hemoglobin, and ferritin. - Order vitamin D, B12, and thyroid function tests. - Encourage consistent use of iron supplements.  Heavy and irregular menstrual bleeding with IUD in situ Heavy menstrual bleeding with IUD, possibly contributing to anemia. Plans for IUD removal due to heavy bleeding. - Schedule IUD removal with OB GYN. - Discuss alternative contraceptive options post-IUD removal.  Sleep disturbance related to childcare and difficulty settling Sleep disturbance due to childcare responsibilities and breastfeeding, contributing to fatigue. - Discuss potential use of melatonin. - Consider anxiety management options for settling at night.  Anxiety symptoms Anxiety symptoms may contribute to difficulty settling at night. Discussed Buspar use post-breastfeeding. - Consider Buspar for anxiety management post-breastfeeding.         Orders Placed This Encounter  Procedures   Iron, TIBC and Ferritin Panel   CBC with Differential/Platelet   VITAMIN D 25 Hydroxy (Vit-D Deficiency, Fractures)   Vitamin B12   TSH   T4, free    No orders of the defined types were placed in this encounter.    Follow up plan: Return in about 4 weeks (around 04/14/2024) for 1 month fasting lab then physical.  Marsa Officer, DO [facility] 03/17/2024, 9:43 AM

## 2024-03-17 NOTE — Patient Instructions (Addendum)
 Thank you for coming to the office today.  We can discuss anxiety or sleep options further when no longer breastfeeding  Labs today for iron anemia and vitamins etc  Please schedule a Follow-up Appointment to: Return in about 4 weeks (around 04/14/2024) for 1 month Annual Physical, AM fasting labs AFTER.  If you have any other questions or concerns, please feel free to call the office or send a message through MyChart. You may also schedule an earlier appointment if necessary.  Additionally, you may be receiving a survey about your experience at our office within a few days to 1 week by e-mail or mail. We value your feedback.  Marsa Officer, DO [facility] [place]

## 2024-03-18 LAB — IRON,TIBC AND FERRITIN PANEL
%SAT: 21 % (ref 16–45)
Ferritin: 34 ng/mL (ref 16–154)
Iron: 74 ug/dL (ref 40–190)
TIBC: 355 ug/dL (ref 250–450)

## 2024-03-18 LAB — CBC WITH DIFFERENTIAL/PLATELET
Absolute Lymphocytes: 1809 {cells}/uL (ref 850–3900)
Absolute Monocytes: 342 {cells}/uL (ref 200–950)
Basophils Absolute: 28 {cells}/uL (ref 0–200)
Basophils Relative: 0.5 %
Eosinophils Absolute: 39 {cells}/uL (ref 15–500)
Eosinophils Relative: 0.7 %
HCT: 34.9 % — ABNORMAL LOW (ref 35.0–45.0)
Hemoglobin: 11.4 g/dL — ABNORMAL LOW (ref 11.7–15.5)
MCH: 29 pg (ref 27.0–33.0)
MCHC: 32.7 g/dL (ref 32.0–36.0)
MCV: 88.8 fL (ref 80.0–100.0)
MPV: 9.8 fL (ref 7.5–12.5)
Monocytes Relative: 6.1 %
Neutro Abs: 3382 {cells}/uL (ref 1500–7800)
Neutrophils Relative %: 60.4 %
Platelets: 377 Thousand/uL (ref 140–400)
RBC: 3.93 Million/uL (ref 3.80–5.10)
RDW: 13.3 % (ref 11.0–15.0)
Total Lymphocyte: 32.3 %
WBC: 5.6 Thousand/uL (ref 3.8–10.8)

## 2024-03-18 LAB — VITAMIN D 25 HYDROXY (VIT D DEFICIENCY, FRACTURES): Vit D, 25-Hydroxy: 31 ng/mL (ref 30–100)

## 2024-03-18 LAB — T4, FREE: Free T4: 1.1 ng/dL (ref 0.8–1.8)

## 2024-03-18 LAB — TSH: TSH: 1.24 m[IU]/L

## 2024-03-18 LAB — VITAMIN B12: Vitamin B-12: 560 pg/mL (ref 200–1100)

## 2024-03-20 ENCOUNTER — Ambulatory Visit: Payer: Self-pay | Admitting: Family Medicine

## 2024-03-22 NOTE — Progress Notes (Signed)
    SUBJECTIVE: Here for removal of her Paragard  IUD. Since having the IUD her cycles have become heavy, she bleed through a tampon and her clothes and now is anemic. She would like contraception, she would like lighter or no period. She does not use tobacco, denies hx of CHTN or Migraines with aura.   After discussing all options and small risk of VTE with BMI >30 and estrogen Alexsys decided she would like to try the Mirena IUD.   OBJECTIVE: BP 118/76 (BP Location: Right Arm, Patient Position: Sitting, Cuff Size: Normal)   Pulse 77   Ht 5' 8 (1.727 m)   Wt 210 lb 11.2 oz (95.6 kg)   BMI 32.04 kg/m   GEN; NAD   GYNECOLOGY OFFICE PROCEDURE NOTE  Olivia Frost is a 33 y.o. [id number] here for IUD removal. The patient currently has a Paragard  IUD placed on July 2024. No GYN concerns.  Last pap smear was on 01/2021 and was normal.  IUD Removal and Reinsertion  Patient identified, informed consent performed, consent signed.   Discussed risks of irregular bleeding, cramping, infection, malpositioning or uterine perforation of the IUD which may require further procedures. Time out was performed. Speculum placed in the vagina. The strings of the IUD were grasped and pulled using ring forceps. The IUD was successfully removed in its entirety. The cervix was cleaned with Betadine x 2 and grasped posteriorly with a single tooth tenaculum.  The uterus was  sounded to 8 cm using a uterine sound.  The IUD was then placed per manufacturer's recommendations. Strings trimmed to 2- 3 cm. Tenaculum was removed, good hemostasis noted. Patient tolerated procedure well.   Patient was given post-procedure instructions.  Patient was also asked to check IUD strings periodically and follow up in 6 weeks for IUD check.   Olivia Frost, CNM  [facility]

## 2024-03-24 ENCOUNTER — Ambulatory Visit: Admitting: Licensed Practical Nurse

## 2024-03-24 VITALS — BP 118/76 | HR 77 | Ht 68.0 in | Wt 210.7 lb

## 2024-03-24 DIAGNOSIS — Z30433 Encounter for removal and reinsertion of intrauterine contraceptive device: Secondary | ICD-10-CM

## 2024-03-25 ENCOUNTER — Encounter: Payer: Self-pay | Admitting: Licensed Practical Nurse

## 2024-03-30 ENCOUNTER — Ambulatory Visit: Admitting: Podiatry

## 2024-03-30 DIAGNOSIS — S82892A Other fracture of left lower leg, initial encounter for closed fracture: Secondary | ICD-10-CM | POA: Diagnosis not present

## 2024-03-30 DIAGNOSIS — M216X2 Other acquired deformities of left foot: Secondary | ICD-10-CM

## 2024-03-30 DIAGNOSIS — M216X1 Other acquired deformities of right foot: Secondary | ICD-10-CM

## 2024-03-30 NOTE — Progress Notes (Signed)
 Subjective:  Patient ID: Olivia Frost, female    DOB: 06-23-1991,  MRN: [id number]  Chief Complaint  Patient presents with   Foot Pain    33 y.o. female presents with the above complaint.  Patient presents with left ankle pain.  She states she is doing okay she is still feeling pain she feels pain along the hardware denies any other acute complaints.  Would like to discuss next treatment plan  Review of Systems: Negative except as noted in the HPI. Denies N/V/F/Ch.  Past Medical History:  Diagnosis Date   Anemia during pregnancy in third trimester 10/09/2022   COVID 03/26/2022   Migraines    Obesity in pregnancy 09/03/2022   Supervision of other normal pregnancy, antepartum 04/22/2022              Clinical Staff    Provider      Office Location     [facility]    Dating     11/21/2022, by Patient Reported         Language     English    Anatomy US      Normal       Flu Vaccine     offer    Genetic Screen     NIPS: Negative      TDaP vaccine      09/03/22    Hgb A1C or   GTT    Early :  Third trimester : 72      Covid    Has booster         LAB RESULTS       Rhogam     B/Positiv   SVD (spontaneous vaginal delivery) 10/07/2022   Trimalleolar fracture 03/2023   left    Current Outpatient Medications:    acetaminophen  (TYLENOL ) 500 MG tablet, Take 1,000 mg by mouth every 8 (eight) hours as needed (pain.)., Disp: , Rfl:    ferrous sulfate 324 MG TBEC, Take 324 mg by mouth., Disp: , Rfl:   Social History   Tobacco Use  Smoking Status Never  Smokeless Tobacco Never    Allergies  Allergen Reactions   Latex Rash   Objective:  There were no vitals filed for this visit. There is no height or weight on file to calculate BMI. Constitutional Well developed. Well nourished.  Vascular Dorsalis pedis pulses palpable bilaterally. Posterior tibial pulses palpable bilaterally. Capillary refill normal to all digits.  No cyanosis or clubbing noted. Pedal hair growth normal.   Neurologic Normal speech. Oriented to person, place, and time. Epicritic sensation to light touch grossly present bilaterally.  Dermatologic Nails well groomed and normal in appearance. No open wounds. No skin lesions.  Orthopedic: Pain on palpation to the left medial ankle gutter pain with range of motion of the ankle joint with mild deep intra-articular pain noted no crepitus noted.  No pain at the Achilles tendon posterior tibial tendon peroneal tendon   Radiographs: 3 views of skeletally mature adult left ankle: Good correction alignment noted no signs of varus osteoarthritis noted no hardware failure noted. Assessment:   1. Closed fracture of left ankle, initial encounter   2. Other acquired deformities of left foot   3. Other acquired deformities of right foot    Plan:  Patient was evaluated and treated and all questions answered.  Left ankle capsulitis with history of ankle fracture - All questions and concerns were discussed with the patient in extensive detail clinically she is having hardware pain.  Given the amount of pain that she is having at this time she would benefit from CT scan as has been closer to a year mark.  I discussed this with the patient she states understanding - CT scan was ordered I will see her back after for hardware removal surgical consultation  Pes planovalgus/foot deformity -I explained to patient the etiology of pes planovalgus and relationship with heel pain/arch pain and various treatment options were discussed.  Given patient foot structure in the setting of heel pain/arch pain I believe patient will benefit from custom-made orthotics to help control the hindfoot motion support the arch of the foot and take the stress away from arches.  Patient agrees with the plan like to proceed with orthotics -Patient was casted for orthotics  No follow-ups on file.

## 2024-03-31 ENCOUNTER — Other Ambulatory Visit

## 2024-04-03 ENCOUNTER — Ambulatory Visit
Admission: RE | Admit: 2024-04-03 | Discharge: 2024-04-03 | Disposition: A | Source: Ambulatory Visit | Attending: Podiatry

## 2024-04-03 ENCOUNTER — Other Ambulatory Visit: Payer: Self-pay | Admitting: Podiatry

## 2024-04-03 DIAGNOSIS — S82892A Other fracture of left lower leg, initial encounter for closed fracture: Secondary | ICD-10-CM

## 2024-04-11 ENCOUNTER — Ambulatory Visit: Admitting: Podiatry

## 2024-04-12 ENCOUNTER — Encounter: Payer: Self-pay | Admitting: Podiatry

## 2024-04-13 ENCOUNTER — Ambulatory Visit: Admitting: Podiatry

## 2024-04-13 DIAGNOSIS — Z01818 Encounter for other preprocedural examination: Secondary | ICD-10-CM | POA: Diagnosis not present

## 2024-04-13 DIAGNOSIS — T85848D Pain due to other internal prosthetic devices, implants and grafts, subsequent encounter: Secondary | ICD-10-CM | POA: Diagnosis not present

## 2024-04-13 NOTE — Progress Notes (Signed)
 Subjective:  Patient ID: Olivia Frost, female    DOB: 1991-02-14,  MRN: [id number]  Chief Complaint  Patient presents with   Fracture    Pt is here to go over CT results     33 y.o. female presents with the above complaint.  Patient presents with left ankle pain.  She is here to go over CT scan and discuss hardware removal she still continues to have the same pain pain scale 7 out of 10 dull aching nature has failed all conservative care include shoe gear modification padding protecting offloading and would like to proceed with surgery  Review of Systems: Negative except as noted in the HPI. Denies N/V/F/Ch.  Past Medical History:  Diagnosis Date   Anemia during pregnancy in third trimester 10/09/2022   COVID 03/26/2022   Migraines    Obesity in pregnancy 09/03/2022   Supervision of other normal pregnancy, antepartum 04/22/2022              Clinical Staff    Provider      Office Location     [facility]    Dating     11/21/2022, by Patient Reported         Language     English    Anatomy US      Normal       Flu Vaccine     offer    Genetic Screen     NIPS: Negative      TDaP vaccine      09/03/22    Hgb A1C or   GTT    Early :  Third trimester : 12      Covid    Has booster         LAB RESULTS       Rhogam     B/Positiv   SVD (spontaneous vaginal delivery) 10/07/2022   Trimalleolar fracture 03/2023   left    Current Outpatient Medications:    acetaminophen  (TYLENOL ) 500 MG tablet, Take 1,000 mg by mouth every 8 (eight) hours as needed (pain.)., Disp: , Rfl:    ferrous sulfate 324 MG TBEC, Take 324 mg by mouth., Disp: , Rfl:   Social History   Tobacco Use  Smoking Status Never  Smokeless Tobacco Never    Allergies  Allergen Reactions   Latex Rash   Objective:  There were no vitals filed for this visit. There is no height or weight on file to calculate BMI. Constitutional Well developed. Well nourished.  Vascular Dorsalis pedis pulses palpable  bilaterally. Posterior tibial pulses palpable bilaterally. Capillary refill normal to all digits.  No cyanosis or clubbing noted. Pedal hair growth normal.  Neurologic Normal speech. Oriented to person, place, and time. Epicritic sensation to light touch grossly present bilaterally.  Dermatologic Nails well groomed and normal in appearance. No open wounds. No skin lesions.  Orthopedic: Pain on palpation to the left medial ankle gutter pain with range of motion of the ankle joint with mild deep intra-articular pain noted no crepitus noted.  No pain at the Achilles tendon posterior tibial tendon peroneal tendon   Radiographs: 3 views of skeletally mature adult left ankle: Good correction alignment noted no signs of varus osteoarthritis noted no hardware failure noted.  IMPRESSION: 1. Partially visualized lateral plate and screw fixation of the distal fibular fracture and transsyndesmotic tight rope fixation of the distal tibiofibular syndesmosis. Visualized hardware is intact without evidence of complication. No visible distal fibular fracture. 2. Essentially healed posterior malleolar  fracture with defect along the inferior cortical margin extending posteromedially into the posterior aspect of the medial malleolus with subcortical cystic change. 3. Osseous fragments adjacent to the tip of the medial malleolus may reflect sequela of medial ankle ligamentous avulsion injury. 4. Mild tibiotalar joint space narrowing. Assessment:   1. Pain from implanted hardware, subsequent encounter   2. Encounter for preoperative examination for general surgical procedure     Plan:  Patient was evaluated and treated and all questions answered.  Left ankle capsulitis with history of ankle fracture - All questions and concerns were discussed with the patient in extensive detail clinically she is having hardware pain.  Given that she still continues to have hardware pain CT scan results were reviewed  which shows healed fracture site I believe she would benefit from removal of all orthopedic hardware I discussed my preoperative entire postop plan with the patient extensive detail she states understanding like to proceed with that -Informed surgical risk consent was reviewed and read aloud to the patient.  I reviewed the films.  I have discussed my findings with the patient in great detail.  I have discussed all risks including but not limited to infection, stiffness, scarring, limp, disability, deformity, damage to blood vessels and nerves, numbness, poor healing, need for braces, arthritis, chronic pain, amputation, death.  All benefits and realistic expectations discussed in great detail.  I have made no promises as to the outcome.  I have provided realistic expectations.  I have offered the patient a 2nd opinion, which they have declined and assured me they preferred to proceed despite the risks   Pes planovalgus/foot deformity -I explained to patient the etiology of pes planovalgus and relationship with heel pain/arch pain and various treatment options were discussed.  Given patient foot structure in the setting of heel pain/arch pain I believe patient will benefit from custom-made orthotics to help control the hindfoot motion support the arch of the foot and take the stress away from arches.  Patient agrees with the plan like to proceed with orthotics -Patient was casted for orthotics  No follow-ups on file.

## 2024-04-18 ENCOUNTER — Encounter: Payer: Self-pay | Admitting: Podiatry

## 2024-04-18 ENCOUNTER — Telehealth: Payer: Self-pay | Admitting: Podiatry

## 2024-04-18 ENCOUNTER — Telehealth: Payer: Self-pay | Admitting: Lab

## 2024-04-18 NOTE — Telephone Encounter (Signed)
 Called patient and scheduled surgery for 04/24/2024 per her request. Patient is not on any GLP!/bloodthinners. Patient pharmacy has been set as preferred in chart. [facility] [address]. Patient confirmed receipt of surgery bag and is aware that [facility] will call 24-48 hours prior to surgery date with arrival time.

## 2024-04-18 NOTE — Telephone Encounter (Signed)
 Patients last out of work not is dated until 04/11/2024 is she out of work and until when [employer] comp needs a new out of work note if patient is still out of work.

## 2024-04-18 NOTE — Telephone Encounter (Signed)
 cld pt to adv of forms recd, but wld fax note for now and email it to her. I adv if forms are needed she will have to pay 25 before I can complete and I let her know she can pay in the [facility] office DOS 04/24/24. RTW approx 07/10/24

## 2024-04-19 ENCOUNTER — Telehealth: Payer: Self-pay | Admitting: Podiatry

## 2024-04-19 NOTE — Telephone Encounter (Signed)
 DOS- 04/24/2024  REMOVAL FIXATION DEEP K-WIRE/SCREW X2 LT- 20680  [facility] EFFECTIVE DATE- 03/29/2024   PER FAX RECEIVED FROM [facility], NO PRIOR AUTH IS REQUIRED FOR CPT CODE 79319. DOCUMENTATION ATTACHED TO SURGERY CONSENT PACKET.

## 2024-04-24 ENCOUNTER — Other Ambulatory Visit: Payer: Self-pay | Admitting: Podiatry

## 2024-04-24 DIAGNOSIS — T8484XA Pain due to internal orthopedic prosthetic devices, implants and grafts, initial encounter: Secondary | ICD-10-CM | POA: Diagnosis not present

## 2024-04-24 MED ORDER — IBUPROFEN 800 MG PO TABS
800.0000 mg | ORAL_TABLET | Freq: Four times a day (QID) | ORAL | 1 refills | Status: AC | PRN
Start: 2024-04-24 — End: ?

## 2024-04-24 MED ORDER — OXYCODONE-ACETAMINOPHEN 5-325 MG PO TABS: 1.0000 | ORAL_TABLET | ORAL | 0 refills | Status: DC | PRN

## 2024-04-25 ENCOUNTER — Encounter: Admitting: Podiatry

## 2024-04-26 ENCOUNTER — Other Ambulatory Visit

## 2024-04-26 DIAGNOSIS — Z0000 Encounter for general adult medical examination without abnormal findings: Secondary | ICD-10-CM

## 2024-04-26 DIAGNOSIS — R7309 Other abnormal glucose: Secondary | ICD-10-CM

## 2024-04-26 DIAGNOSIS — D509 Iron deficiency anemia, unspecified: Secondary | ICD-10-CM

## 2024-04-26 DIAGNOSIS — Z13220 Encounter for screening for lipoid disorders: Secondary | ICD-10-CM

## 2024-04-27 ENCOUNTER — Ambulatory Visit: Admitting: Licensed Practical Nurse

## 2024-05-02 ENCOUNTER — Ambulatory Visit

## 2024-05-02 ENCOUNTER — Ambulatory Visit (INDEPENDENT_AMBULATORY_CARE_PROVIDER_SITE_OTHER): Admitting: Podiatry

## 2024-05-02 DIAGNOSIS — T85848D Pain due to other internal prosthetic devices, implants and grafts, subsequent encounter: Secondary | ICD-10-CM

## 2024-05-02 DIAGNOSIS — Z9889 Other specified postprocedural states: Secondary | ICD-10-CM

## 2024-05-02 MED ORDER — OXYCODONE-ACETAMINOPHEN 5-325 MG PO TABS: 1.0000 | ORAL_TABLET | ORAL | 0 refills | Status: AC | PRN

## 2024-05-02 NOTE — Progress Notes (Signed)
 Subjective:  Patient ID: Olivia Frost, female    DOB: 1991-02-10,  MRN: [id number]  Chief Complaint  Patient presents with   Routine Post Op    POV #1 DOS 04/24/2024 LT REMOVAL FIXATION DEEP KWIRE/SCREW X2    DOS: 04/24/2024 Procedure: Left removal of all orthopedic hardware  33 y.o. female returns for post-op check.  She states that she is doing well denies any other acute complaints mild pain.  No nausea fever chills vomiting.  Weightbearing as tolerated with surgical shoe  Review of Systems: Negative except as noted in the HPI. Denies N/V/F/Ch.  Past Medical History:  Diagnosis Date   Anemia during pregnancy in third trimester 10/09/2022   COVID 03/26/2022   Migraines    Obesity in pregnancy 09/03/2022   Supervision of other normal pregnancy, antepartum 04/22/2022              Clinical Staff    Provider      Office Location     [facility]    Dating     11/21/2022, by Patient Reported         Language     English    Anatomy US      Normal       Flu Vaccine     offer    Genetic Screen     NIPS: Negative      TDaP vaccine      09/03/22    Hgb A1C or   GTT    Early :  Third trimester : 60      Covid    Has booster         LAB RESULTS       Rhogam     B/Positiv   SVD (spontaneous vaginal delivery) 10/07/2022   Trimalleolar fracture 03/2023   left    Current Outpatient Medications:    oxyCODONE -acetaminophen  (PERCOCET) 5-325 MG tablet, Take 1 tablet by mouth every 4 (four) hours as needed for severe pain (pain score 7-10)., Disp: 30 tablet, Rfl: 0   acetaminophen  (TYLENOL ) 500 MG tablet, Take 1,000 mg by mouth every 8 (eight) hours as needed (pain.)., Disp: , Rfl:    ferrous sulfate 324 MG TBEC, Take 324 mg by mouth., Disp: , Rfl:    ibuprofen  (ADVIL ) 800 MG tablet, Take 1 tablet (800 mg total) by mouth every 6 (six) hours as needed., Disp: 60 tablet, Rfl: 1   oxyCODONE -acetaminophen  (PERCOCET) 5-325 MG tablet, Take 1 tablet by mouth every 4 (four) hours as needed for severe  pain (pain score 7-10)., Disp: 30 tablet, Rfl: 0  Social History   Tobacco Use  Smoking Status Never  Smokeless Tobacco Never    Allergies  Allergen Reactions   Latex Rash   Objective:  There were no vitals filed for this visit. There is no height or weight on file to calculate BMI. Constitutional Well developed. Well nourished.  Vascular Foot warm and well perfused. Capillary refill normal to all digits.   Neurologic Normal speech. Oriented to person, place, and time. Epicritic sensation to light touch grossly present bilaterally.  Dermatologic Skin healing well without signs of infection. Skin edges well coapted without signs of infection.  Orthopedic: Tenderness to palpation noted about the surgical site.   Radiographs: 3 views of skeletally mature adult left foot: No orthopedic hardware noted good ankle mortise notedNo reduction of deformity noted. Assessment:   1. Pain from implanted hardware, subsequent encounter   2. Status post foot surgery  Plan:  Patient was evaluated and treated and all questions answered.  S/p foot surgery left -Progressing as expected post-operatively. -XR: See above -WB Status: Weightbearing as tolerated in surgical shoe -Sutures: Intact.  No clinical signs of dehiscence noted no complication noted -Medications: None -Foot redressed.  No follow-ups on file.

## 2024-05-03 ENCOUNTER — Ambulatory Visit: Admitting: Family Medicine

## 2024-05-03 ENCOUNTER — Encounter: Payer: Self-pay | Admitting: Family Medicine

## 2024-05-03 VITALS — BP 134/82 | HR 69 | Ht 68.0 in | Wt 213.4 lb

## 2024-05-03 DIAGNOSIS — Z0000 Encounter for general adult medical examination without abnormal findings: Secondary | ICD-10-CM | POA: Diagnosis not present

## 2024-05-03 NOTE — Patient Instructions (Addendum)
 Thank you for coming to the office today.  [email or web address]  As foot/ankle heals, okay to proceed w/ exercise and activity as planned into 2026. Follow your foot specialist guidance advice when to start  Check with insurance on offering counseling or therapy through them [facility] Or can check this list and see if you prefer any of these.   These offices have both PSYCHIATRY doctors and THERAPISTS  [facility], [facility] Available) [place] [address] [place], [place] [address] Phone: [phone]  [facility] (Virtual Available) [address], [place], [place] [address] Phone: [phone]  ---------------------------------------------------------  [facility] Address: [address] [place], [place] [address] [email or web address] Phone:  [phone]  [facility]) Address: [address], [place], [place] [address] Hours: 8:30AM-5PM Phone: [phone]  [facility] (Adult, Peds, Geriatric, Counseling) [address] [place], [place] [address] Phone: [phone] Fax: [phone]  [facility] at [facility] [address] [place], [place] [address] Phone: [phone]  [facility] (All ages) [address] [facility] [place], [id number] Phone: [phone] (Option 1) [email or web address]  [facility] (including Age 80+) [address] [place], [place] [address], [place]  [place], [place] Phone: [phone] FAX: [phone]  ----------------------------------------------------------------- THERAPIST ONLY  (No Psychiatry)  [facility] [address] [place], [place] [address] [place]   [phone]  [facility],  [facility] [address] [place], [place] [address] [phone]  [facility]   Address: [address] [place], [place] [address] Hours: Open today  9AM-7PM Phone: [phone]  [facility] [address], [facility]  - [facility] Address: [address] [place], [place] [address] Phone: [phone]  [facility] [place], [place] [address] Phone: [phone]  --------------  Alcohol / Substance  [facility] [phone] [facility]  [phone] [facility] [phone]    Please schedule a Follow-up Appointment to: Return for 6 month MyChart Virtual Visit - follow-up anxiety / lifestyle / weight updates.  If you have any other questions or concerns, please feel free to call the office or send a message through MyChart. You may also schedule an earlier appointment if necessary.  Additionally, you may be receiving a survey about your experience at our office within a few days to 1 week by e-mail or mail. We value your feedback.  Marsa Officer, DO [facility], [place]

## 2024-05-03 NOTE — Progress Notes (Incomplete)
 Subjective:    Patient ID: Olivia Frost, female    DOB: [birth year], 33 y.o.   MRN: [id number]  Olivia Frost is a 33 y.o. female presenting on 05/03/2024 for Annual Exam   HPI  Discussed the use of AI scribe software for clinical note transcription with the patient, who gave verbal consent to proceed.  History of Present Illness   Olivia Frost is a 33 year old female who presents for follow-up after ankle surgery and to address anxiety symptoms.  Postoperative ankle pain and recovery Managed by Podiatry Dr Tobie - Status post ankle surgery on March 25, 2024 - Persistent pain, improved compared to the first week post-surgery - First postoperative visit occurred yesterday - Staple removal scheduled in two weeks - No fevers or new complications - Mobility remains limited  Anxiety symptoms - Episodes of anxiety, most prominent before surgery and again yesterday - Symptoms include heavy sensation in chest and occasional difficulty breathing temporary episodes only occurring with stressful situation - Not able to take medication due to breastfeeding - Interested in therapist  Anemia - Laboratory-confirmed anemia in September 2025 - Currently fasting for lab work that was previously missed due to surgery - Plans to complete laboratory testing today   Lifestyle   Weight goal down to 185 lbs Overall physical activity limited now but plan to increase when able to tolerate in future  Fatigue and low energy - Chronic fatigue and low energy - Symptoms impact ability to engage in physical activities and work - Fatigue exacerbated by sleep disturbances and anemia   Iron deficiency anemia and menstrual irregularities - History of iron deficiency anemia since college - Inconsistent use of iron supplements (tablets and gummies) - Heavy menstrual cycles persist despite increased regularity since IUD placement in July 2024 - Considering IUD removal due to concerns about pH  balance and continued heavy bleeding - Previously on Nexplanon   History Left Ankle Fracture, 03/2023 Followed by [facility] Dr Tobie, had surgery 04/2023 Still having issues with complications and inflammation She has retained hardware, and questioning if this needs to be replaced or removed She has completed Physical Therapy Limited activity due to the Left Ankle - Pain limits ambulation, stair climbing, jogging, and conditioning exercises    Reduced Energy Fatigued / Tired   Sleep disturbance - Approximately five hours of sleep per night - Sleep interrupted by nursing 88-month-old child and older child's bad dreams - Difficulty settling and falling asleep before midnight - Sleep disruption contributes to daytime fatigue   Health Maintenance:  Discussed Immunization Record history for updating Hep B  OBGYN apt 05/18/24 pap smear update      05/03/2024   10:25 AM 03/17/2024    9:35 AM  Depression screen PHQ 2/9  Decreased Interest 1 1  Down, Depressed, Hopeless 1 1  PHQ - 2 Score 2 2  Altered sleeping 0 0  Tired, decreased energy 1 1  Change in appetite 0 0  Feeling bad or failure about yourself  0 0  Trouble concentrating 0 0  Moving slowly or fidgety/restless 0 0  Suicidal thoughts 0 0  PHQ-9 Score 3 3  Difficult doing work/chores Not difficult at all Not difficult at all       05/03/2024   10:26 AM 03/17/2024    9:36 AM  GAD 7 : Generalized Anxiety Score  Nervous, Anxious, on Edge 1 1  Control/stop worrying 1 0  Worry too much - different things 0 1  Trouble relaxing 0 1  Restless 0 0  Easily annoyed or irritable 1 1  Afraid - awful might happen 0 0  Total GAD 7 Score 3 4  Anxiety Difficulty  Not difficult at all     Past Medical History:  Diagnosis Date  . Anemia during pregnancy in third trimester 10/09/2022  . COVID 03/26/2022  . Migraines   . Obesity in pregnancy 09/03/2022  . Supervision of other normal pregnancy, antepartum 04/22/2022               Clinical Staff    Provider      Office Location     [facility]    Dating     11/21/2022, by Patient Reported         Language     English    Anatomy US      Normal       Flu Vaccine     offer    Genetic Screen     NIPS: Negative      TDaP vaccine      09/03/22    Hgb A1C or   GTT    Early :  Third trimester : 53      Covid    Has booster         LAB RESULTS       Rhogam     B/Positiv  . SVD (spontaneous vaginal delivery) 10/07/2022  . Trimalleolar fracture 03/2023   left   Past Surgical History:  Procedure Laterality Date  . ORIF ANKLE FRACTURE Left 05/04/2023   Procedure: OPEN REDUCTION INTERNAL FIXATION (ORIF) ANKLE FRACTURE;  Surgeon: Tobie Franky SQUIBB, DPM;  Location: [facility] ORS;  Service: Orthopedics/Podiatry;  Laterality: Left;  POPLITEAL/SAPHENOUS BLOCK  . WRIST SURGERY Left    Social History   Socioeconomic History  . Marital status: Significant Other    Spouse name: Not on file  . Number of children: 1  . Years of education: 25  . Highest education level: Not on file  Occupational History  . Occupation: proposal Editor, Commissioning: [employer]  Tobacco Use  . Smoking status: Never  . Smokeless tobacco: Never  Vaping Use  . Vaping status: Never Used  Substance and Sexual Activity  . Alcohol use: Yes    Comment: occ  . Drug use: No  . Sexual activity: Yes    Partners: Male    Birth control/protection: I.U.D.  Other Topics Concern  . Not on file  Social History Narrative  . Not on file   Social Drivers of Health   Financial Resource Strain: Low Risk  (04/22/2022)   Overall Financial Resource Strain (CARDIA)   . Difficulty of Paying Living Expenses: Not hard at all  Food Insecurity: No Food Insecurity (11/25/2022)   Hunger Vital Sign   . Worried About Programme Researcher, Broadcasting/film/video in the Last Year: Never true   . Ran Out of Food in the Last Year: Never true  Transportation Needs: No Transportation Needs (11/25/2022)   PRAPARE - Transportation   . Lack  of Transportation (Medical): No   . Lack of Transportation (Non-Medical): No  Physical Activity: Insufficiently Active (04/22/2022)   Exercise Vital Sign   . Days of Exercise per Week: 2 days   . Minutes of Exercise per Session: 10 min  Stress: No Stress Concern Present (04/22/2022)   [employer] of Occupational Health - Occupational Stress Questionnaire   . Feeling of Stress : Not at all  Social  Connections: Moderately Isolated (04/22/2022)   Social Connection and Isolation Panel   . Frequency of Communication with Friends and Family: More than three times a week   . Frequency of Social Gatherings with Friends and Family: Three times a week   . Attends Religious Services: Never   . Active Member of Clubs or Organizations: No   . Attends Banker Meetings: Never   . Marital Status: Living with partner  Intimate Partner Violence: Not At Risk (11/25/2022)   Humiliation, Afraid, Rape, and Kick questionnaire   . Fear of Current or Ex-Partner: No   . Emotionally Abused: No   . Physically Abused: No   . Sexually Abused: No   Family History  Problem Relation Age of Onset  . Anxiety disorder Sister   . Depression Sister   . Breast cancer Maternal Grandmother 31  . Sickle cell anemia Paternal Grandmother   . Breast cancer Paternal Great-grandmother   . Lung cancer Paternal Great-grandmother   . Throat cancer Paternal Great-grandmother    Current Outpatient Medications on File Prior to Visit  Medication Sig  . acetaminophen  (TYLENOL ) 500 MG tablet Take 1,000 mg by mouth every 8 (eight) hours as needed (pain.).  SABRA ferrous sulfate 324 MG TBEC Take 324 mg by mouth.  . ibuprofen  (ADVIL ) 800 MG tablet Take 1 tablet (800 mg total) by mouth every 6 (six) hours as needed.  . oxyCODONE -acetaminophen  (PERCOCET) 5-325 MG tablet Take 1 tablet by mouth every 4 (four) hours as needed for severe pain (pain score 7-10).  . oxyCODONE -acetaminophen  (PERCOCET) 5-325 MG tablet Take 1  tablet by mouth every 4 (four) hours as needed for severe pain (pain score 7-10).   No current facility-administered medications on file prior to visit.    Review of Systems  Constitutional:  Negative for activity change, appetite change, chills, diaphoresis, fatigue and fever.  HENT:  Negative for congestion and hearing loss.   Eyes:  Negative for visual disturbance.  Respiratory:  Negative for cough, chest tightness, shortness of breath and wheezing.   Cardiovascular:  Negative for chest pain, palpitations and leg swelling.  Gastrointestinal:  Negative for abdominal pain, constipation, diarrhea, nausea and vomiting.  Genitourinary:  Negative for dysuria, frequency and hematuria.  Musculoskeletal:  Positive for arthralgias and gait problem. Negative for neck pain.  Skin:  Negative for rash.  Neurological:  Negative for dizziness, weakness, light-headedness, numbness and headaches.  Hematological:  Negative for adenopathy.  Psychiatric/Behavioral:  Negative for behavioral problems, dysphoric mood and sleep disturbance.    Per HPI unless specifically indicated above     Objective:    BP 134/82 (BP Location: Left Arm, Patient Position: Sitting, Cuff Size: Large)   Pulse 69   Ht 5' 8 (1.727 m)   Wt 213 lb 6 oz (96.8 kg)   SpO2 96%   BMI 32.44 kg/m   Wt Readings from Last 3 Encounters:  05/03/24 213 lb 6 oz (96.8 kg)  03/24/24 210 lb 11.2 oz (95.6 kg)  03/17/24 206 lb 8 oz (93.7 kg)    Physical Exam Vitals and nursing note reviewed.  Constitutional:      General: She is not in acute distress.    Appearance: She is well-developed. She is not diaphoretic.     Comments: Well-appearing, comfortable, cooperative  HENT:     Head: Normocephalic and atraumatic.  Eyes:     General:        Right eye: No discharge.  Left eye: No discharge.     Conjunctiva/sclera: Conjunctivae normal.     Pupils: Pupils are equal, round, and reactive to light.  Neck:     Thyroid: No  thyromegaly.     Vascular: No carotid bruit.  Cardiovascular:     Rate and Rhythm: Normal rate and regular rhythm.     Pulses: Normal pulses.     Heart sounds: Normal heart sounds. No murmur heard. Pulmonary:     Effort: Pulmonary effort is normal. No respiratory distress.     Breath sounds: Normal breath sounds. No wheezing or rales.  Abdominal:     General: Bowel sounds are normal. There is no distension.     Palpations: Abdomen is soft. There is no mass.     Tenderness: There is no abdominal tenderness.  Musculoskeletal:        General: No tenderness. Normal range of motion.     Cervical back: Normal range of motion and neck supple.     Right lower leg: No edema.     Left lower leg: No edema.     Comments: Upper / Lower Extremities: - Normal muscle tone, strength bilateral upper extremities 5/5, lower extremities 5/5  Post-op L ankle  Lymphadenopathy:     Cervical: No cervical adenopathy.  Skin:    General: Skin is warm and dry.     Findings: No erythema or rash.  Neurological:     Mental Status: She is alert and oriented to person, place, and time.     Comments: Distal sensation intact to light touch all extremities  Psychiatric:        Mood and Affect: Mood normal.        Behavior: Behavior normal.        Thought Content: Thought content normal.     Comments: Well groomed, good eye contact, normal speech and thoughts     Results for orders placed or performed in visit on 03/17/24  Iron, TIBC and Ferritin Panel   Collection Time: 03/17/24 10:12 AM  Result Value Ref Range   Iron 74 40 - 190 mcg/dL   TIBC 644 749 - 549 mcg/dL (calc)   %SAT 21 16 - 45 % (calc)   Ferritin 34 16 - 154 ng/mL  CBC with Differential/Platelet   Collection Time: 03/17/24 10:12 AM  Result Value Ref Range   WBC 5.6 3.8 - 10.8 Thousand/uL   RBC 3.93 3.80 - 5.10 Million/uL   Hemoglobin 11.4 (L) 11.7 - 15.5 g/dL   HCT 65.0 (L) 64.9 - 54.9 %   MCV 88.8 80.0 - 100.0 fL   MCH 29.0 27.0 - 33.0  pg   MCHC 32.7 32.0 - 36.0 g/dL   RDW 86.6 88.9 - 84.9 %   Platelets 377 140 - 400 Thousand/uL   MPV 9.8 7.5 - 12.5 fL   Neutro Abs 3,382 1,500 - 7,800 cells/uL   Absolute Lymphocytes 1,809 850 - 3,900 cells/uL   Absolute Monocytes 342 200 - 950 cells/uL   Eosinophils Absolute 39 15 - 500 cells/uL   Basophils Absolute 28 0 - 200 cells/uL   Neutrophils Relative % 60.4 %   Total Lymphocyte 32.3 %   Monocytes Relative 6.1 %   Eosinophils Relative 0.7 %   Basophils Relative 0.5 %  VITAMIN D  25 Hydroxy (Vit-D Deficiency, Fractures)   Collection Time: 03/17/24 10:12 AM  Result Value Ref Range   Vit D, 25-Hydroxy 31 30 - 100 ng/mL  Vitamin B12   Collection Time: 03/17/24  10:12 AM  Result Value Ref Range   Vitamin B-12 560 200 - 1,100 pg/mL  TSH   Collection Time: 03/17/24 10:12 AM  Result Value Ref Range   TSH 1.24 mIU/L  T4, free   Collection Time: 03/17/24 10:12 AM  Result Value Ref Range   Free T4 1.1 0.8 - 1.8 ng/dL      Assessment & Plan:   Problem List Items Addressed This Visit   None Visit Diagnoses       Annual physical exam    -  Primary        Updated Health Maintenance information Fasting labs ordered today, pending Encouraged improvement to lifestyle with diet and exercise Goal of weight loss  Adult Wellness Visit Routine wellness visit focused on lifestyle and health maintenance. - Ordered fasting labs: blood count, sugar, cholesterol, chemistry, kidney, liver function tests. - Encouraged weight loss and increased physical activity as tolerated in future, however limited by her ankle surgery - Discussed [employer] for dietitian consultation if covered by insurance. - Scheduled Pap smear with GYN on November 24th, 2025.  Postoperative state of ankle surgery Managed by Podiatrist Postoperative state following ankle surgery on September 27th, 2025. Incisions healing well, pain improving. - Continue follow-up with surgeon in two weeks for staple  removal.  Anemia Chronic anemia with no new symptoms. - Ordered blood count as part of fasting labs.  Anxiety symptoms Intermittent anxiety with chest heaviness and dyspnea, likely stress-related. Prefers non-pharmacological management due to breastfeeding. - Provided list of therapists offering virtual sessions. - Encouraged exploration of mental health resources through insurance. - Discussed potential for future treatment if symptoms persist.  Breastfeeding - Continue breastfeeding and avoid medications unless necessary.          No orders of the defined types were placed in this encounter.   No orders of the defined types were placed in this encounter.    Follow up plan: Return for 6 month MyChart Virtual Visit - follow-up anxiety / lifestyle / weight updates.  Marsa Officer, DO [facility] 05/03/2024, 10:45 AM

## 2024-05-03 NOTE — Progress Notes (Unsigned)
 Subjective:    Patient ID: Olivia Frost, female    DOB: 1990-10-13, 33 y.o.   MRN: [id number]  Olivia Frost is a 33 y.o. female presenting on 05/03/2024 for Annual Exam   HPI  Discussed the use of AI scribe software for clinical note transcription with the patient, who gave verbal consent to proceed.  History of Present Illness   ***Post-op visit yesterday 05/02/24 Dr Tobie Podiatry, implanted hardware Improving  ***Anxiety related to pre-surgery concerns Heavy chest Episodes of anxiety flaring up General stressors affecting her  *** Weight goal down to 185 lbs Overall physical activity increase   Fatigue and low energy - Chronic fatigue and low energy - Symptoms impact ability to engage in physical activities and work - Fatigue exacerbated by sleep disturbances and anemia   Iron deficiency anemia and menstrual irregularities - History of iron deficiency anemia since college - Inconsistent use of iron supplements (tablets and gummies) - Heavy menstrual cycles persist despite increased regularity since IUD placement in July 2024 - Considering IUD removal due to concerns about pH balance and continued heavy bleeding - Previously on Nexplanon   History Left Ankle Fracture, 03/2023 Followed by [facility] Dr Tobie, had surgery 04/2023 Still having issues with complications and inflammation She has retained hardware, and questioning if this needs to be replaced or removed She has completed Physical Therapy Limited activity due to the Left Ankle - Pain limits ambulation, stair climbing, jogging, and conditioning exercises   Recent pregnancy 2023   Reduced Energy Fatigued / Tired   Sleep disturbance - Approximately five hours of sleep per night - Sleep interrupted by nursing 12-month-old child and older child's bad dreams - Difficulty settling and falling asleep before midnight - Sleep disruption contributes to daytime fatigue   She is at home with 18  month old, per Podiatry orders, should limit time on her ankle and has remained out of work after her recent maternity leave, and complication with hernia, diastasis recti, pelvic floor, back Previously office work, secondary school teacher, sedentary   Health Maintenance:  Discussed Immunization Record history for updating Hep B  OBGYN apt 05/18/24 pap smear update      05/03/2024   10:25 AM 03/17/2024    9:35 AM  Depression screen PHQ 2/9  Decreased Interest 1 1  Down, Depressed, Hopeless 1 1  PHQ - 2 Score 2 2  Altered sleeping 0 0  Tired, decreased energy 1 1  Change in appetite 0 0  Feeling bad or failure about yourself  0 0  Trouble concentrating 0 0  Moving slowly or fidgety/restless 0 0  Suicidal thoughts 0 0  PHQ-9 Score 3 3  Difficult doing work/chores Not difficult at all Not difficult at all       05/03/2024   10:26 AM 03/17/2024    9:36 AM  GAD 7 : Generalized Anxiety Score  Nervous, Anxious, on Edge 1 1  Control/stop worrying 1 0  Worry too much - different things 0 1  Trouble relaxing 0 1  Restless 0 0  Easily annoyed or irritable 1 1  Afraid - awful might happen 0 0  Total GAD 7 Score 3 4  Anxiety Difficulty  Not difficult at all     Past Medical History:  Diagnosis Date   Anemia during pregnancy in third trimester 10/09/2022   COVID 03/26/2022   Migraines    Obesity in pregnancy 09/03/2022   Supervision of other normal pregnancy, antepartum 04/22/2022  Clinical Staff    Provider      Office Location     [facility]    Dating     11/21/2022, by Patient Reported         Language     English    Anatomy US      Normal       Flu Vaccine     offer    Genetic Screen     NIPS: Negative      TDaP vaccine      09/03/22    Hgb A1C or   GTT    Early :  Third trimester : 50      Covid    Has booster         LAB RESULTS       Rhogam     B/Positiv   SVD (spontaneous vaginal delivery) 10/07/2022   Trimalleolar fracture 03/2023   left   Past Surgical  History:  Procedure Laterality Date   ORIF ANKLE FRACTURE Left 05/04/2023   Procedure: OPEN REDUCTION INTERNAL FIXATION (ORIF) ANKLE FRACTURE;  Surgeon: Tobie Franky SQUIBB, DPM;  Location: [facility];  Service: Orthopedics/Podiatry;  Laterality: Left;  POPLITEAL/SAPHENOUS BLOCK   WRIST SURGERY Left    Social History   Socioeconomic History   Marital status: Significant Other    Spouse name: Not on file   Number of children: 1   Years of education: 71   Highest education level: Not on file  Occupational History   Occupation: proposal developmental coordinator    Employer: [employer]  Tobacco Use   Smoking status: Never   Smokeless tobacco: Never  Vaping Use   Vaping status: Never Used  Substance and Sexual Activity   Alcohol use: Yes    Comment: occ   Drug use: No   Sexual activity: Yes    Partners: Male    Birth control/protection: I.U.D.  Other Topics Concern   Not on file  Social History Narrative   Not on file   Social Drivers of Health   Financial Resource Strain: Low Risk  (04/22/2022)   Overall Financial Resource Strain (CARDIA)    Difficulty of Paying Living Expenses: Not hard at all  Food Insecurity: No Food Insecurity (11/25/2022)   Hunger Vital Sign    Worried About Running Out of Food in the Last Year: Never true    Ran Out of Food in the Last Year: Never true  Transportation Needs: No Transportation Needs (11/25/2022)   PRAPARE - Administrator, Civil Service (Medical): No    Lack of Transportation (Non-Medical): No  Physical Activity: Insufficiently Active (04/22/2022)   Exercise Vital Sign    Days of Exercise per Week: 2 days    Minutes of Exercise per Session: 10 min  Stress: No Stress Concern Present (04/22/2022)   [employer] of Occupational Health - Occupational Stress Questionnaire    Feeling of Stress : Not at all  Social Connections: Moderately Isolated (04/22/2022)   Social Connection and Isolation Panel    Frequency of Communication  with Friends and Family: More than three times a week    Frequency of Social Gatherings with Friends and Family: Three times a week    Attends Religious Services: Never    Active Member of Clubs or Organizations: No    Attends Banker Meetings: Never    Marital Status: Living with partner  Intimate Partner Violence: Not At Risk (11/25/2022)   Humiliation, Afraid, Rape, and Kick questionnaire  Fear of Current or Ex-Partner: No    Emotionally Abused: No    Physically Abused: No    Sexually Abused: No   Family History  Problem Relation Age of Onset   Anxiety disorder Sister    Depression Sister    Breast cancer Maternal Grandmother 98   Sickle cell anemia Paternal Grandmother    Breast cancer Paternal Great-grandmother    Lung cancer Paternal Great-grandmother    Throat cancer Paternal Great-grandmother    Current Outpatient Medications on File Prior to Visit  Medication Sig   acetaminophen  (TYLENOL ) 500 MG tablet Take 1,000 mg by mouth every 8 (eight) hours as needed (pain.).   ferrous sulfate 324 MG TBEC Take 324 mg by mouth.   ibuprofen  (ADVIL ) 800 MG tablet Take 1 tablet (800 mg total) by mouth every 6 (six) hours as needed.   oxyCODONE -acetaminophen  (PERCOCET) 5-325 MG tablet Take 1 tablet by mouth every 4 (four) hours as needed for severe pain (pain score 7-10).   oxyCODONE -acetaminophen  (PERCOCET) 5-325 MG tablet Take 1 tablet by mouth every 4 (four) hours as needed for severe pain (pain score 7-10).   No current facility-administered medications on file prior to visit.    Review of Systems Per HPI unless specifically indicated above     Objective:    BP 134/82 (BP Location: Left Arm, Patient Position: Sitting, Cuff Size: Large)   Pulse 69   Ht 5' 8 (1.727 m)   Wt 213 lb 6 oz (96.8 kg)   SpO2 96%   BMI 32.44 kg/m   Wt Readings from Last 3 Encounters:  05/03/24 213 lb 6 oz (96.8 kg)  03/24/24 210 lb 11.2 oz (95.6 kg)  03/17/24 206 lb 8 oz (93.7 kg)     Physical Exam  Results for orders placed or performed in visit on 03/17/24  Iron, TIBC and Ferritin Panel   Collection Time: 03/17/24 10:12 AM  Result Value Ref Range   Iron 74 40 - 190 mcg/dL   TIBC 644 749 - 549 mcg/dL (calc)   %SAT 21 16 - 45 % (calc)   Ferritin 34 16 - 154 ng/mL  CBC with Differential/Platelet   Collection Time: 03/17/24 10:12 AM  Result Value Ref Range   WBC 5.6 3.8 - 10.8 Thousand/uL   RBC 3.93 3.80 - 5.10 Million/uL   Hemoglobin 11.4 (L) 11.7 - 15.5 g/dL   HCT 65.0 (L) 64.9 - 54.9 %   MCV 88.8 80.0 - 100.0 fL   MCH 29.0 27.0 - 33.0 pg   MCHC 32.7 32.0 - 36.0 g/dL   RDW 86.6 88.9 - 84.9 %   Platelets 377 140 - 400 Thousand/uL   MPV 9.8 7.5 - 12.5 fL   Neutro Abs 3,382 1,500 - 7,800 cells/uL   Absolute Lymphocytes 1,809 850 - 3,900 cells/uL   Absolute Monocytes 342 200 - 950 cells/uL   Eosinophils Absolute 39 15 - 500 cells/uL   Basophils Absolute 28 0 - 200 cells/uL   Neutrophils Relative % 60.4 %   Total Lymphocyte 32.3 %   Monocytes Relative 6.1 %   Eosinophils Relative 0.7 %   Basophils Relative 0.5 %  VITAMIN D  25 Hydroxy (Vit-D Deficiency, Fractures)   Collection Time: 03/17/24 10:12 AM  Result Value Ref Range   Vit D, 25-Hydroxy 31 30 - 100 ng/mL  Vitamin B12   Collection Time: 03/17/24 10:12 AM  Result Value Ref Range   Vitamin B-12 560 200 - 1,100 pg/mL  TSH   Collection  Time: 03/17/24 10:12 AM  Result Value Ref Range   TSH 1.24 mIU/L  T4, free   Collection Time: 03/17/24 10:12 AM  Result Value Ref Range   Free T4 1.1 0.8 - 1.8 ng/dL      Assessment & Plan:   Problem List Items Addressed This Visit   None Visit Diagnoses       Annual physical exam    -  Primary        Updated Health Maintenance information ***- Reviewed recent lab results with patient Encouraged improvement to lifestyle with diet and exercise -*** Goal of weight loss  Assessment and Plan Assessment & Plan      No orders of the defined types  were placed in this encounter.   No orders of the defined types were placed in this encounter.    Follow up plan: Return for 6 month MyChart Virtual Visit - follow-up anxiety / lifestyle / weight updates.  Marsa Officer, DO [facility] 05/03/2024, 10:45 AM

## 2024-05-04 ENCOUNTER — Ambulatory Visit: Payer: Self-pay | Admitting: Family Medicine

## 2024-05-04 LAB — LIPID PANEL
Cholesterol: 166 mg/dL (ref <200)
HDL: 64 mg/dL (ref >=50)
LDL Cholesterol (Calc): 89 mg/dL
Non-HDL Cholesterol (Calc): 102 mg/dL (ref <130)
Total CHOL/HDL Ratio: 2.6 (calc) (ref <5.0)
Triglycerides: 50 mg/dL (ref <150)

## 2024-05-04 LAB — CBC WITH DIFFERENTIAL/PLATELET
Absolute Lymphocytes: 2178 {cells}/uL (ref 850–3900)
Absolute Monocytes: 421 {cells}/uL (ref 200–950)
Basophils Absolute: 43 {cells}/uL (ref 0–200)
Basophils Relative: 0.7 %
Eosinophils Absolute: 92 {cells}/uL (ref 15–500)
Eosinophils Relative: 1.5 %
HCT: 34.1 % — ABNORMAL LOW (ref 35.0–45.0)
Hemoglobin: 10.9 g/dL — ABNORMAL LOW (ref 11.7–15.5)
MCH: 28.2 pg (ref 27.0–33.0)
MCHC: 32 g/dL (ref 32.0–36.0)
MCV: 88.3 fL (ref 80.0–100.0)
MPV: 10.1 fL (ref 7.5–12.5)
Monocytes Relative: 6.9 %
Neutro Abs: 3367 {cells}/uL (ref 1500–7800)
Neutrophils Relative %: 55.2 %
Platelets: 421 Thousand/uL — ABNORMAL HIGH (ref 140–400)
RBC: 3.86 Million/uL (ref 3.80–5.10)
RDW: 13.2 % (ref 11.0–15.0)
Total Lymphocyte: 35.7 %
WBC: 6.1 Thousand/uL (ref 3.8–10.8)

## 2024-05-04 LAB — HEMOGLOBIN A1C
Hgb A1c MFr Bld: 5.2 % (ref <5.7)
Mean Plasma Glucose: 103 mg/dL
eAG (mmol/L): 5.7 mmol/L

## 2024-05-04 LAB — COMPREHENSIVE METABOLIC PANEL WITH GFR
AG Ratio: 1.4 (calc) (ref 1.0–2.5)
ALT: 8 U/L (ref 6–29)
AST: 11 U/L (ref 10–30)
Albumin: 4.4 g/dL (ref 3.6–5.1)
Alkaline phosphatase (APISO): 80 U/L (ref 31–125)
BUN: 13 mg/dL (ref 7–25)
CO2: 25 mmol/L (ref 20–32)
Calcium: 9.4 mg/dL (ref 8.6–10.2)
Chloride: 104 mmol/L (ref 98–110)
Creat: 0.63 mg/dL (ref 0.50–0.97)
Globulin: 3.2 g/dL (ref 1.9–3.7)
Glucose, Bld: 73 mg/dL (ref 65–99)
Potassium: 4.2 mmol/L (ref 3.5–5.3)
Sodium: 138 mmol/L (ref 135–146)
Total Bilirubin: 0.3 mg/dL (ref 0.2–1.2)
Total Protein: 7.6 g/dL (ref 6.1–8.1)
eGFR: 122 mL/min/1.73m2 (ref >=60)

## 2024-05-09 ENCOUNTER — Encounter: Admitting: Podiatry

## 2024-05-16 ENCOUNTER — Ambulatory Visit (INDEPENDENT_AMBULATORY_CARE_PROVIDER_SITE_OTHER): Admitting: Podiatry

## 2024-05-16 DIAGNOSIS — Z9889 Other specified postprocedural states: Secondary | ICD-10-CM

## 2024-05-16 DIAGNOSIS — T85848D Pain due to other internal prosthetic devices, implants and grafts, subsequent encounter: Secondary | ICD-10-CM

## 2024-05-16 NOTE — Progress Notes (Signed)
 Subjective:  Patient ID: Olivia Frost, female    DOB: 08-27-1990,  MRN: [id number]  Chief Complaint  Patient presents with   Routine Post Op    POV #2 DOS 04/24/2024 LT REMOVAL FIXATION DEEP KWIRE/SCREW X2    DOS: 04/24/2024 Procedure: Left removal of all orthopedic hardware  33 y.o. female returns for post-op check.  She states that she is doing well denies any other acute complaints mild pain.  No nausea fever chills vomiting.  Weightbearing as tolerated with surgical shoe  Review of Systems: Negative except as noted in the HPI. Denies N/V/F/Ch.  Past Medical History:  Diagnosis Date   Anemia during pregnancy in third trimester 10/09/2022   COVID 03/26/2022   Migraines    Obesity in pregnancy 09/03/2022   Supervision of other normal pregnancy, antepartum 04/22/2022              Clinical Staff    Provider      Office Location     [facility]    Dating     11/21/2022, by Patient Reported         Language     English    Anatomy US      Normal       Flu Vaccine     offer    Genetic Screen     NIPS: Negative      TDaP vaccine      09/03/22    Hgb A1C or   GTT    Early :  Third trimester : 3      Covid    Has booster         LAB RESULTS       Rhogam     B/Positiv   SVD (spontaneous vaginal delivery) 10/07/2022   Trimalleolar fracture 03/2023   left    Current Outpatient Medications:    acetaminophen  (TYLENOL ) 500 MG tablet, Take 1,000 mg by mouth every 8 (eight) hours as needed (pain.)., Disp: , Rfl:    ferrous sulfate 324 MG TBEC, Take 324 mg by mouth., Disp: , Rfl:    ibuprofen  (ADVIL ) 800 MG tablet, Take 1 tablet (800 mg total) by mouth every 6 (six) hours as needed., Disp: 60 tablet, Rfl: 1   oxyCODONE -acetaminophen  (PERCOCET) 5-325 MG tablet, Take 1 tablet by mouth every 4 (four) hours as needed for severe pain (pain score 7-10)., Disp: 30 tablet, Rfl: 0   oxyCODONE -acetaminophen  (PERCOCET) 5-325 MG tablet, Take 1 tablet by mouth every 4 (four) hours as needed for severe  pain (pain score 7-10)., Disp: 30 tablet, Rfl: 0  Social History   Tobacco Use  Smoking Status Never  Smokeless Tobacco Never    Allergies  Allergen Reactions   Latex Rash   Objective:  There were no vitals filed for this visit. There is no height or weight on file to calculate BMI. Constitutional Well developed. Well nourished.  Vascular Foot warm and well perfused. Capillary refill normal to all digits.   Neurologic Normal speech. Oriented to person, place, and time. Epicritic sensation to light touch grossly present bilaterally.  Dermatologic Skin completely epithelialized no signs of dehiscence noted no complication noted.  Good range of motion noted at the ankle joint.  Orthopedic: No further tenderness to palpation noted about the surgical site.   Radiographs: 3 views of skeletally mature adult left foot: No orthopedic hardware noted good ankle mortise notedNo reduction of deformity noted. Assessment:   No diagnosis found.  Plan:  Patient was evaluated  and treated and all questions answered.  S/p foot surgery left - Clinically healed and officially discharged from my care if any foot and ankle issues in the future she will come back and see me.  She states understanding. No follow-ups on file.

## 2024-05-18 ENCOUNTER — Other Ambulatory Visit (HOSPITAL_COMMUNITY)
Admission: RE | Admit: 2024-05-18 | Discharge: 2024-05-18 | Disposition: A | Discharge status: Home / Self Care | Source: Ambulatory Visit | Attending: Licensed Practical Nurse | Admitting: Licensed Practical Nurse

## 2024-05-18 ENCOUNTER — Ambulatory Visit: Admitting: Licensed Practical Nurse

## 2024-05-18 ENCOUNTER — Encounter: Payer: Self-pay | Admitting: Licensed Practical Nurse

## 2024-05-18 VITALS — BP 112/83 | Resp 16 | Ht 68.0 in | Wt 214.0 lb

## 2024-05-18 DIAGNOSIS — N898 Other specified noninflammatory disorders of vagina: Secondary | ICD-10-CM | POA: Insufficient documentation

## 2024-05-18 DIAGNOSIS — Z124 Encounter for screening for malignant neoplasm of cervix: Secondary | ICD-10-CM | POA: Diagnosis present

## 2024-05-18 DIAGNOSIS — Z01411 Encounter for gynecological examination (general) (routine) with abnormal findings: Secondary | ICD-10-CM

## 2024-05-18 DIAGNOSIS — N941 Unspecified dyspareunia: Secondary | ICD-10-CM | POA: Diagnosis not present

## 2024-05-18 DIAGNOSIS — Z01419 Encounter for gynecological examination (general) (routine) without abnormal findings: Secondary | ICD-10-CM | POA: Diagnosis present

## 2024-05-18 DIAGNOSIS — Z113 Encounter for screening for infections with a predominantly sexual mode of transmission: Secondary | ICD-10-CM | POA: Diagnosis present

## 2024-05-18 NOTE — Progress Notes (Signed)
 Gynecology Annual Exam   PCP: Edman Marsa PARAS, DO  Chief Complaint:  Chief Complaint  Patient presents with   Annual Exam    History of Present Illness: Patient is a 33 y.o. [phone] presents for annual exam. Here with her daughter   Sometimes get an odor:mainly after IC, has used Boric Acid which seems to help but the odor returns when not using Boric Acid  Looks pregnant, has a DR, has not bee able to work out as she would like due to knee surgery, is there anything I can do about my middle?SABRA She has seen a pelvic floor therapist in the past.    LMP: No LMP recorded. (Menstrual status: IUD). Spotting with IUD  Average Interval:  Duration of flow: 10 days Heavy Menses: no Clots: no Intermenstrual Bleeding: no Postcoital Bleeding: no Dysmenorrhea: no  The patient is sexually active. She currently uses IUD for contraception. She admits to some milddyspareunia. -sometimes, may be related positions-last night made her stomach cramping, the pain is usually on the left side with deep penetration.   The patient does perform self breast exams.  There is notable family history of breast but no ovarian cancer in her family. Maternal Grandmother diafnosided 78, her mtoehr had genteic testing,  Paternal great grandmother Breat, ovaria and lung   The patient wears seatbelts: yes.   The patient has regular exercise: no.  Had recent knee  surgery   The patient denies current symptoms of depression.   Was laid off Dec 2024, at home with 27month old Lives with her partner and 2 kids, feels safe  PCP: A K aramalegos with [place]  Dentist: about 1 year ago  Wears glasses: 3 weeks ago    Review of Systems: ROS see HPI   Past Medical History:  Patient Active Problem List   Diagnosis Date Noted   Chronic pain of left ankle 03/17/2024   Iron deficiency anemia 03/17/2024   History of fracture of left ankle 03/17/2024   Intrauterine device 01/11/2023    Past Surgical  History:  Past Surgical History:  Procedure Laterality Date   ORIF ANKLE FRACTURE Left 05/04/2023   Procedure: OPEN REDUCTION INTERNAL FIXATION (ORIF) ANKLE FRACTURE;  Surgeon: Tobie Franky SQUIBB, DPM;  Location: [facility] ORS;  Service: Orthopedics/Podiatry;  Laterality: Left;  POPLITEAL/SAPHENOUS BLOCK   WRIST SURGERY Left     Gynecologic History:  No LMP recorded. (Menstrual status: IUD). Contraception: IUD Last Pap: 02/13/2021 Results were: no abnormalities   Obstetric History: [id number]  Family History:  Family History  Problem Relation Age of Onset   Anxiety disorder Sister    Depression Sister    Breast cancer Maternal Grandmother 63   Sickle cell anemia Paternal Grandmother    Breast cancer Paternal Great-grandmother    Lung cancer Paternal Great-grandmother    Throat cancer Paternal Great-grandmother     Social History:  Social History   Socioeconomic History   Marital status: Significant Other    Spouse name: Not on file   Number of children: 1   Years of education: 16   Highest education level: Not on file  Occupational History   Occupation: proposal Editor, Commissioning: [employer]  Tobacco Use   Smoking status: Never   Smokeless tobacco: Never  Vaping Use   Vaping status: Never Used  Substance and Sexual Activity   Alcohol use: Yes    Comment: occ   Drug use: No   Sexual activity: Yes  Partners: Male    Birth control/protection: I.U.D.  Other Topics Concern   Not on file  Social History Narrative   Not on file   Social Drivers of Health   Financial Resource Strain: Low Risk  (04/22/2022)   Overall Financial Resource Strain (CARDIA)    Difficulty of Paying Living Expenses: Not hard at all  Food Insecurity: No Food Insecurity (11/25/2022)   Hunger Vital Sign    Worried About Running Out of Food in the Last Year: Never true    Ran Out of Food in the Last Year: Never true  Transportation Needs: No Transportation Needs (11/25/2022)   PRAPARE -  Administrator, Civil Service (Medical): No    Lack of Transportation (Non-Medical): No  Physical Activity: Insufficiently Active (04/22/2022)   Exercise Vital Sign    Days of Exercise per Week: 2 days    Minutes of Exercise per Session: 10 min  Stress: No Stress Concern Present (04/22/2022)   [employer] of Occupational Health - Occupational Stress Questionnaire    Feeling of Stress : Not at all  Social Connections: Moderately Isolated (04/22/2022)   Social Connection and Isolation Panel    Frequency of Communication with Friends and Family: More than three times a week    Frequency of Social Gatherings with Friends and Family: Three times a week    Attends Religious Services: Never    Active Member of Clubs or Organizations: No    Attends Banker Meetings: Never    Marital Status: Living with partner  Intimate Partner Violence: Not At Risk (11/25/2022)   Humiliation, Afraid, Rape, and Kick questionnaire    Fear of Current or Ex-Partner: No    Emotionally Abused: No    Physically Abused: No    Sexually Abused: No    Allergies:  Allergies  Allergen Reactions   Latex Rash    Medications: Prior to Admission medications   Medication Sig Start Date End Date Taking? Authorizing Provider  acetaminophen  (TYLENOL ) 500 MG tablet Take 1,000 mg by mouth every 8 (eight) hours as needed (pain.).    [provider]  ferrous sulfate 324 MG TBEC Take 324 mg by mouth.    [provider]  ibuprofen  (ADVIL ) 800 MG tablet Take 1 tablet (800 mg total) by mouth every 6 (six) hours as needed. 04/24/24   Tobie Franky SQUIBB, DPM  oxyCODONE -acetaminophen  (PERCOCET) 5-325 MG tablet Take 1 tablet by mouth every 4 (four) hours as needed for severe pain (pain score 7-10). 04/24/24   Tobie Franky SQUIBB, DPM  oxyCODONE -acetaminophen  (PERCOCET) 5-325 MG tablet Take 1 tablet by mouth every 4 (four) hours as needed for severe pain (pain score 7-10). 05/02/24   Tobie Franky SQUIBB, DPM    Physical Exam Vitals: currently breastfeeding.  General: NAD HEENT: normocephalic, anicteric Thyroid: no enlargement, no palpable nodules Pulmonary: No increased work of breathing, CTAB Cardiovascular: RRR, distal pulses 2+ Breast: Breast symmetrical, no tenderness, no palpable nodules or masses, no skin or nipple retraction present, no nipple discharge.  No axillary or supraclavicular lymphadenopathy. Abdomen: NABS, soft, non-tender, non-distended.  Umbilicus without lesions.  No hepatomegaly, splenomegaly or masses palpable. No evidence of hernia  DR 2 FB Genitourinary:  External: Normal external female genitalia.  Normal urethral meatus, normal Bartholin's and Skene's glands.    Vagina: Normal vaginal mucosa, no evidence of prolapse.  Good tone  Cervix: Grossly normal in appearance, no bleeding. IUD strings visible, about 1cm.   Uterus: Non-enlarged, mobile, normal  contour.  No CMT  Adnexa: ovaries non-enlarged, no adnexal masses  Rectal: deferred  Lymphatic: no evidence of inguinal lymphadenopathy Extremities: no edema, erythema, or tenderness Neurologic: Grossly intact Psychiatric: mood appropriate, affect full  Assessment: 33 y.o. [id number] routine annual exam  Plan: Problem List Items Addressed This Visit   None Visit Diagnoses       Encounter for well woman exam with routine gynecological exam    -  Primary     Pain in female genitalia on intercourse       Relevant Orders   US  PELVIC COMPLETE WITH TRANSVAGINAL     Well woman exam       Relevant Orders   Cytology - PAP   US  PELVIC COMPLETE WITH TRANSVAGINAL   HEP, RPR, HIV Panel   Hepatitis C antibody     Screening examination for venereal disease       Relevant Orders   Cytology - PAP   HEP, RPR, HIV Panel   Hepatitis C antibody     Cervical cancer screening       Relevant Orders   Cytology - PAP     Vaginal odor       Relevant Orders   Cervicovaginal ancillary only       2) STI screening   wasoffered and declined  2)  ASCCP guidelines and rational discussed.  Patient opts for every 3 years screening interval  3) Contraception - the patient is currently using  IUD.  She is happy with her current form of contraception and plans to continue  4) Routine healthcare maintenance including cholesterol, diabetes screening discussed managed by PCP  5) Rec seeing PT for abdominal exercises  6) reviewed pain with  IC could be related to IC, some people with endo have pain with IC-this is typically diagnosed through surgery. Will do US  and go from there.   Will return for labwork, unable to stay for labs.     Jinnie Cookey, CNM  [facility] 05/18/2024, 4:57 PM

## 2024-05-18 NOTE — Patient Instructions (Signed)
 Preventive Care 31-31 Years Old, Female  Preventive care refers to lifestyle choices and visits with your health care provider that can promote health and wellness. Preventive care visits are also called wellness exams. What can I expect for my preventive care visit? Counseling During your preventive care visit, your health care provider may ask about your: Medical history, including: Past medical problems. Family medical history. Pregnancy history. Current health, including: Menstrual cycle. Method of birth control. Emotional well-being. Home life and relationship well-being. Sexual activity and sexual health. Lifestyle, including: Alcohol, nicotine  or tobacco, and drug use. Access to firearms. Diet, exercise, and sleep habits. Work and work astronomer. Sunscreen use. Safety issues such as seatbelt and bike helmet use. Physical exam Your health care provider may check your: Height and weight. These may be used to calculate your BMI (body mass index). BMI is a measurement that tells if you are at a healthy weight. Waist circumference. This measures the distance around your waistline. This measurement also tells if you are at a healthy weight and may help predict your risk of certain diseases, such as type 2 diabetes and high blood pressure. Heart rate and blood pressure. Body temperature. Skin for abnormal spots. What immunizations do I need?  Vaccines are usually given at various ages, according to a schedule. Your health care provider will recommend vaccines for you based on your age, medical history, and lifestyle or other factors, such as travel or where you work. What tests do I need? Screening Your health care provider may recommend screening tests for certain conditions. This may include: Pelvic exam and Pap test. Lipid and cholesterol levels. Diabetes screening. This is done by checking your blood sugar (glucose) after you have not eaten for a while  (fasting). Hepatitis B test. Hepatitis C test. HIV (human immunodeficiency virus) test. STI (sexually transmitted infection) testing, if you are at risk. BRCA-related cancer screening. This may be done if you have a family history of breast, ovarian, tubal, or peritoneal cancers. Talk with your health care provider about your test results, treatment options, and if necessary, the need for more tests. Follow these instructions at home: Eating and drinking  Eat a healthy diet that includes fresh fruits and vegetables, whole grains, lean protein, and low-fat dairy products. Take vitamin and mineral supplements as recommended by your health care provider. Do not drink alcohol if: Your health care provider tells you not to drink. You are pregnant, may be pregnant, or are planning to become pregnant. If you drink alcohol: Limit how much you have to 0-1 drink a day. Know how much alcohol is in your drink. In the [place] one drink equals one 12 oz bottle of beer (355 mL), one 5 oz glass of wine (148 mL), or one 1 oz glass of hard liquor (44 mL). Lifestyle Brush your teeth every morning and night with fluoride toothpaste. Floss one time each day. Exercise for at least 30 minutes 5 or more days each week. Do not use any products that contain nicotine  or tobacco. These products include cigarettes, chewing tobacco, and vaping devices, such as e-cigarettes. If you need help quitting, ask your health care provider. Do not use drugs. If you are sexually active, practice safe sex. Use a condom or other form of protection to prevent STIs. If you do not wish to become pregnant, use a form of birth control. If you plan to become pregnant, see your health care provider for a prepregnancy visit. Find healthy ways to manage stress, such as:  Meditation, yoga, or listening to music. Journaling. Talking to a trusted person. Spending time with friends and family. Minimize exposure to UV radiation to reduce your  risk of skin cancer. Safety Always wear your seat belt while driving or riding in a vehicle. Do not drive: If you have been drinking alcohol. Do not ride with someone who has been drinking. If you have been using any mind-altering substances or drugs. While texting. When you are tired or distracted. Wear a helmet and other protective equipment during sports activities. If you have firearms in your house, make sure you follow all gun safety procedures. Seek help if you have been physically or sexually abused. What's next? Go to your health care provider once a year for an annual wellness visit. Ask your health care provider how often you should have your eyes and teeth checked. Stay up to date on all vaccines. This information is not intended to replace advice given to you by your health care provider. Make sure you discuss any questions you have with your health care provider. Document Revised: 12/11/2020 Document Reviewed: 12/11/2020 Elsevier Patient Education  2024 Elsevier Inc.  Commonly Asked Questions During Pregnancy  Cats: A parasite can be excreted in cat feces.  To avoid exposure you need to have another person empty the little box.  If you must empty the litter box you will need to wear gloves.  Wash your hands after handling your cat.  This parasite can also be found in raw or undercooked meat so this should also be avoided.  Colds, Sore Throats, Flu: Please check your medication sheet to see what you can take for symptoms.  If your symptoms are unrelieved by these medications please call the office.  Dental Work: Most any dental work agricultural consultant recommends is permitted.  X-rays should only be taken during the first trimester if absolutely necessary.  Your abdomen should be shielded with a lead apron during all x-rays.  Please notify your provider prior to receiving any x-rays.  Novocaine is fine; gas is not recommended.  If your dentist requires a note from us  prior to dental  work please call the office and we will provide one for you.  Exercise: Exercise is an important part of staying healthy during your pregnancy.  You may continue most exercises you were accustomed to prior to pregnancy.  Later in your pregnancy you will most likely notice you have difficulty with activities requiring balance like riding a bicycle.  It is important that you listen to your body and avoid activities that put you at a higher risk of falling.  Adequate rest and staying well hydrated are a must!  If you have questions about the safety of specific activities ask your provider.    Exposure to Children with illness: Try to avoid obvious exposure; report any symptoms to us  when noted,  If you have chicken pos, red measles or mumps, you should be immune to these diseases.   Please do not take any vaccines while pregnant unless you have checked with your OB provider.  Fetal Movement: After 28 weeks we recommend you do kick counts twice daily.  Lie or sit down in a calm quiet environment and count your baby movements kicks.  You should feel your baby at least 10 times per hour.  If you have not felt 10 kicks within the first hour get up, walk around and have something sweet to eat or drink then repeat for an additional hour.  If count  remains less than 10 per hour notify your provider.  Fumigating: Follow your pest control agent's advice as to how long to stay out of your home.  Ventilate the area well before re-entering.  Hemorrhoids:   Most over-the-counter preparations can be used during pregnancy.  Check your medication to see what is safe to use.  It is important to use a stool softener or fiber in your diet and to drink lots of liquids.  If hemorrhoids seem to be getting worse please call the office.   Hot Tubs:  Hot tubs Jacuzzis and saunas are not recommended while pregnant.  These increase your internal body temperature and should be avoided.  Intercourse:  Sexual intercourse is safe  during pregnancy as long as you are comfortable, unless otherwise advised by your provider.  Spotting may occur after intercourse; report any bright red bleeding that is heavier than spotting.  Labor:  If you know that you are in labor, please go to the hospital.  If you are unsure, please call the office and let us  help you decide what to do.  Lifting, straining, etc:  If your job requires heavy lifting or straining please check with your provider for any limitations.  Generally, you should not lift items heavier than that you can lift simply with your hands and arms (no back muscles)  Painting:  Paint fumes do not harm your pregnancy, but may make you ill and should be avoided if possible.  Latex or water based paints have less odor than oils.  Use adequate ventilation while painting.  Permanents & Hair Color:  Chemicals in hair dyes are not recommended as they cause increase hair dryness which can increase hair loss during pregnancy.   Highlighting and permanents are allowed.  Dye may be absorbed differently and permanents may not hold as well during pregnancy.  Sunbathing:  Use a sunscreen, as skin burns easily during pregnancy.  Drink plenty of fluids; avoid over heating.  Tanning Beds:  Because their possible side effects are still unknown, tanning beds are not recommended.  Ultrasound Scans:  Routine ultrasounds are performed at approximately 20 weeks.  You will be able to see your baby's general anatomy an if you would like to know the gender this can usually be determined as well.  If it is questionable when you conceived you may also receive an ultrasound early in your pregnancy for dating purposes.  Otherwise ultrasound exams are not routinely performed unless there is a medical necessity.  Although you can request a scan we ask that you pay for it when conducted because insurance does not cover  patient request scans.  Work: If your pregnancy proceeds without complications you may  work until your due date, unless your physician or employer advises otherwise.  Round Ligament Pain/Pelvic Discomfort:  Sharp, shooting pains not associated with bleeding are fairly common, usually occurring in the second trimester of pregnancy.  They tend to be worse when standing up or when you remain standing for long periods of time.  These are the result of pressure of certain pelvic ligaments called round ligaments.  Rest, Tylenol  and heat seem to be the most effective relief.  As the womb and fetus grow, they rise out of the pelvis and the discomfort improves.  Please notify the office if your pain seems different than that described.  It may represent a more serious condition.

## 2024-05-19 ENCOUNTER — Other Ambulatory Visit

## 2024-05-22 ENCOUNTER — Ambulatory Visit: Payer: Self-pay | Admitting: Licensed Practical Nurse

## 2024-05-22 ENCOUNTER — Telehealth: Payer: Self-pay | Admitting: Licensed Practical Nurse

## 2024-05-22 ENCOUNTER — Other Ambulatory Visit: Payer: Self-pay | Admitting: Licensed Practical Nurse

## 2024-05-22 DIAGNOSIS — B9689 Other specified bacterial agents as the cause of diseases classified elsewhere: Secondary | ICD-10-CM

## 2024-05-22 LAB — CERVICOVAGINAL ANCILLARY ONLY
Bacterial Vaginitis (gardnerella): POSITIVE — AB
Candida Glabrata: NEGATIVE
Candida Vaginitis: NEGATIVE
Chlamydia: NEGATIVE
Comment: NEGATIVE
Comment: NEGATIVE
Comment: NEGATIVE
Comment: NEGATIVE
Comment: NEGATIVE
Comment: NORMAL
Neisseria Gonorrhea: NEGATIVE
Trichomonas: NEGATIVE

## 2024-05-22 LAB — CYTOLOGY - PAP: Diagnosis: NEGATIVE

## 2024-05-22 MED ORDER — METRONIDAZOLE 500 MG PO TABS: 500.0000 mg | ORAL_TABLET | Freq: Two times a day (BID) | ORAL | 0 refills | Status: AC

## 2024-05-22 NOTE — Progress Notes (Signed)
 Pt called to verify if she is breastfeeding, unable to leave voice message  Jinnie Cookey, CNM  [facility] 05/22/24  5:50 PM

## 2024-05-22 NOTE — Telephone Encounter (Signed)
 Reached out to pt to reschedule lab appt that was scheduled on 05/19/2024 at 8:40 per LMD.  Left message for pt to cal back to reschedule.

## 2024-05-23 ENCOUNTER — Other Ambulatory Visit: Payer: Self-pay | Admitting: Licensed Practical Nurse

## 2024-05-23 DIAGNOSIS — B9689 Other specified bacterial agents as the cause of diseases classified elsewhere: Secondary | ICD-10-CM

## 2024-05-23 MED ORDER — CLINDAMYCIN PHOSPHATE 2 % VA CREA: 1.0000 | TOPICAL_CREAM | Freq: Every day | VAGINAL | 0 refills | Status: AC

## 2024-05-23 NOTE — Progress Notes (Signed)
 Pt currently breastfeeding, will treat with clindamycin  cream, flagyl  canceled  Jinnie Cookey, CNM  [facility] 05/23/24  12:15 PM

## 2024-05-23 NOTE — Telephone Encounter (Signed)
 Reached out to pt (2x) to reschedule lab appt that was scheduled on 05/19/2024 at 8:40 per LMD.  Was able to reschedule the appt to 05/29/2024 at 9:00.

## 2024-05-29 ENCOUNTER — Other Ambulatory Visit

## 2024-05-29 ENCOUNTER — Telehealth: Payer: Self-pay | Admitting: Licensed Practical Nurse

## 2024-05-29 NOTE — Telephone Encounter (Signed)
 Reached out to pt to reschedule lab appt that was scheduled on 05/29/2024 at 9:00.  Left message for pt to call back to reschedule.

## 2024-05-30 ENCOUNTER — Encounter: Payer: Self-pay | Admitting: Licensed Practical Nurse

## 2024-05-30 NOTE — Telephone Encounter (Signed)
 Reached out to pt (2x) to reschedule lab appt that was scheduled on 05/29/2024 at 9:00.  Could not leave a message bc mailbox was not set up.  Will send a MyChart letter to pt.

## 2024-06-06 ENCOUNTER — Other Ambulatory Visit

## 2024-06-14 ENCOUNTER — Telehealth: Admitting: Physician Assistant

## 2024-06-14 DIAGNOSIS — J069 Acute upper respiratory infection, unspecified: Secondary | ICD-10-CM | POA: Diagnosis not present

## 2024-06-14 MED ORDER — LIDOCAINE VISCOUS HCL 2 % MT SOLN: 5.0000 mL | Freq: Four times a day (QID) | OROMUCOSAL | 0 refills | Status: AC | PRN

## 2024-06-14 MED ORDER — FLUTICASONE PROPIONATE 50 MCG/ACT NA SUSP: 2.0000 | Freq: Every day | NASAL | 0 refills | Status: AC

## 2024-06-14 NOTE — Progress Notes (Signed)
 We are sorry you are not feeling well.  Here is how we plan to help!  Based on what you have shared with me, it looks like you may have a viral upper respiratory infection.  Upper respiratory infections are caused by a large number of viruses; however, rhinovirus is the most common cause.   Symptoms vary from person to person, with common symptoms including sore throat, cough, and fatigue or lack of energy, and a feeling of general discomfort.  A low-grade fever of up to 100.4 may present, but is often uncommon.  Symptoms vary however, and are closely related to a person's age or underlying illnesses.  The most common symptoms associated with an upper respiratory infection are nasal discharge or congestion, cough, sneezing, headache and pressure in the ears and face.  These symptoms usually persist for about 3 to 10 days, but can last up to 2 weeks.  It is important to know that upper respiratory infections do not cause serious illness or complications in most cases.    Upper respiratory infections can be transmitted from person to person, with the most common method of transmission being a person's hands.  The virus is able to live on the skin and can infect other persons for up to 2 hours after direct contact.  Also, these can be transmitted when someone coughs or sneezes; thus, it is important to cover the mouth to reduce this risk.  To keep the spread of the illness at bay, good hand hygiene is very important!  Because this is a viral infection, there are no specific treatments other than to help you with the symptoms until the infection runs its course.    For nasal congestion, you may use an oral decongestants such as Mucinex D or if you have glaucoma or high blood pressure use plain Mucinex.  Saline nasal spray or nasal drops can help and can safely be used as often as needed for congestion.  For your congestion, I have prescribed Fluticasone  nasal spray one spray in each nostril twice a day  If  you do not have a history of heart disease, hypertension, diabetes or thyroid disease, prostate/bladder issues or glaucoma, you may also use Sudafed to treat nasal congestion.  It is highly recommended that you consult with a pharmacist or your primary care physician to ensure this medication is safe for you to take.     If you have a cough, you may use over-the-counter cough suppressants such as Delsym and Robitussin.  If you have glaucoma or high blood pressure, you can also use Coricidin HBP.   The safest cough medications while breastfeeding are the OTC options.  If you have a sore or scratchy throat, use a saltwater gargle-  to  teaspoon of salt dissolved in a 4-ounce to 8-ounce glass of warm water.  Gargle the solution for approximately 15-30 seconds and then spit.  It is important not to swallow the solution.  You can also use throat lozenges/cough drops and Chloraseptic spray to help with throat pain or discomfort.  Warm or cold liquids can also be helpful in relieving throat pain. I have also prescribed I have prescribed a Viscous Lidocaine  2% solution. Swallow 5-10 mL every 4-6 hours as needed for sore throat. DO NOT eat or drink anything for 15-20 minutes after swallowing to allow the medication to coat the throat.  For headache, pain, or general discomfort, you can use Ibuprofen  or Tylenol  as directed.   Some authorities believe that zinc  sprays or the use of Echinacea may shorten the course of your symptoms.   HOME CARE Only take medications as instructed by your medical team. Be sure to drink plenty of fluids. Water is fine as well as fruit juices, sodas and electrolyte beverages. You may want to stay away from caffeine or alcohol. If you are nauseated, try taking small sips of liquids. How do you know if you are getting enough fluid? Your urine should be a pale yellow or almost colorless. Get rest. Taking a steamy shower or using a humidifier may help nasal congestion and ease sore  throat pain. You can place a towel over your head and breathe in the steam from hot water coming from a faucet. Using a saline nasal spray works much the same way. Cough drops, hard candies and sore throat lozenges may ease your cough. Avoid close contacts especially the very young and the elderly Cover your mouth if you cough or sneeze Always remember to wash your hands.   GET HELP RIGHT AWAY IF: You develop worsening fever. If your symptoms do not improve within 10 days You develop yellow or green discharge from your nose over 3 days. You have coughing fits You develop a severe head ache or visual changes. You develop shortness of breath, difficulty breathing or start having chest pain Your symptoms persist after you have completed your treatment plan  MAKE SURE YOU  Understand these instructions. Will watch your condition. Will get help right away if you are not doing well or get worse.  Your e-visit answers were reviewed by a board certified advanced clinical practitioner to complete your personal care plan. Depending upon the condition, your plan could have included both over-the-counter or prescription medications.  Please review your pharmacy choice. If there is a problem, you may call our nursing hot line at and have the prescription routed to another pharmacy. Your safety is important to us . If you have drug allergies, check your prescription carefully.   You can use MyChart to ask questions about todays visit, request a non-urgent call back, or ask for a work or school excuse for 24 hours related to this e-Visit. If it has been greater than 24 hours you will need to follow up with your provider, or enter a new e-Visit to address those concerns. You will get an e-mail in the next two days asking about your experience.  I hope that your e-visit has been valuable and will speed your recovery. Thank you for using e-visits.   I have spent 5 minutes in review of e-visit  questionnaire, review and updating patient chart, medical decision making and response to patient.   Delon CHRISTELLA Dickinson, PA-C

## 2024-06-18 ENCOUNTER — Telehealth: Admitting: Physician Assistant

## 2024-06-18 DIAGNOSIS — B9689 Other specified bacterial agents as the cause of diseases classified elsewhere: Secondary | ICD-10-CM

## 2024-06-18 MED ORDER — AMOXICILLIN 875 MG PO TABS: 875.0000 mg | ORAL_TABLET | Freq: Two times a day (BID) | ORAL | 0 refills | Status: AC

## 2024-06-18 NOTE — Progress Notes (Signed)
"      E-Visit for Sinus Problems  We are sorry that you are not feeling well.  Here is how we plan to help!  Based on what you have shared with me it looks like you have sinusitis.  Sinusitis is inflammation and infection in the sinus cavities of the head.  Based on your presentation I believe you most likely have Acute Bacterial Sinusitis.  This is an infection caused by bacteria and is treated with antibiotics. I have prescribed Amoxicillin  875 mg twice daily for 7 days.  You may use use plain Mucinex. Saline nasal spray help and can safely be used as often as needed for congestion.  If you develop worsening sinus pain, fever or notice severe headache and vision changes, or if symptoms are not better after completion of antibiotic, please schedule an appointment with a health care provider.    Sinus infections are not as easily transmitted as other respiratory infection, however we still recommend that you avoid close contact with loved ones, especially the very young and elderly.  Remember to wash your hands thoroughly throughout the day as this is the number one way to prevent the spread of infection!  Home Care: Only take medications as instructed by your medical team. Complete the entire course of an antibiotic. Do not take these medications with alcohol. A steam or ultrasonic humidifier can help congestion.  You can place a towel over your head and breathe in the steam from hot water coming from a faucet. Avoid close contacts especially the very young and the elderly. Cover your mouth when you cough or sneeze. Always remember to wash your hands.  Get Help Right Away If: You develop worsening fever or sinus pain. You develop a severe head ache or visual changes. Your symptoms persist after you have completed your treatment plan.  Make sure you Understand these instructions. Will watch your condition. Will get help right away if you are not doing well or get worse.  Your e-visit answers  were reviewed by a board certified advanced clinical practitioner to complete your personal care plan.  Depending on the condition, your plan could have included both over the counter or prescription medications.  If there is a problem please reply  once you have received a response from your provider.  Your safety is important to us .  If you have drug allergies check your prescription carefully.    You can use MyChart to ask questions about todays visit, request a non-urgent call back, or ask for a work or school excuse for 24 hours related to this e-Visit. If it has been greater than 24 hours you will need to follow up with your provider, or enter a new e-Visit to address those concerns.  You will get an e-mail in the next two days asking about your experience.  I hope that your e-visit has been valuable and will speed your recovery. Thank you for using e-visits.  I have spent 5 minutes in review of e-visit questionnaire, review and updating patient chart, medical decision making and response to patient.   Elsie Velma Lunger, PA-C     "

## 2024-07-07 ENCOUNTER — Ambulatory Visit
Admission: EM | Admit: 2024-07-07 | Discharge: 2024-07-07 | Disposition: A | Discharge status: Home / Self Care | Attending: Emergency Medicine | Admitting: Emergency Medicine

## 2024-07-07 DIAGNOSIS — R21 Rash and other nonspecific skin eruption: Secondary | ICD-10-CM | POA: Diagnosis not present

## 2024-07-07 DIAGNOSIS — W57XXXA Bitten or stung by nonvenomous insect and other nonvenomous arthropods, initial encounter: Secondary | ICD-10-CM | POA: Diagnosis not present

## 2024-07-07 MED ORDER — CETIRIZINE HCL 10 MG PO TABS: 10.0000 mg | ORAL_TABLET | Freq: Every day | ORAL | 0 refills | Status: AC

## 2024-07-07 MED ORDER — PREDNISONE 10 MG (21) PO TBPK: ORAL_TABLET | Freq: Every day | ORAL | 0 refills | Status: AC

## 2024-07-07 NOTE — Discharge Instructions (Addendum)
 Take the Zyrtec  and prednisone  as directed for your itchy insect bites.    Follow-up with your primary care provider if your symptoms are not improving.

## 2024-07-07 NOTE — ED Triage Notes (Signed)
 Patient to Urgent Care with complaints of multiple insect bites. Concerned about possible bed bugs bites after staying in an air bnb two weeks ago. Areas itchy/ red and inflamed.   Bites present to left leg/ right arm/ right leg.   Attempted use of cortisone cream.

## 2024-07-07 NOTE — ED Provider Notes (Signed)
 " [facility]    CSN: [id number] Arrival date & time: 07/07/24  0844      History   Chief Complaint Chief Complaint  Patient presents with   Insect Bite    HPI Olivia Frost is a 34 y.o. female.  Patient presents with pruritic insect bites on her extremities x 2 weeks.  The bites occurred when she was staying at an air B&B and she is concerned for bedbugs.  She has been treating them with cortisone cream.  No fever or purulent drainage.  Patient had family members with her at the air B&B; she thinks her grandmother has 1 insect bite but no other family members with rash.  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  The history is provided by the patient and medical records.    Past Medical History:  Diagnosis Date   Anemia during pregnancy in third trimester 10/09/2022   COVID 03/26/2022   Migraines    Obesity in pregnancy 09/03/2022   Supervision of other normal pregnancy, antepartum 04/22/2022              Clinical Staff    Provider      Office Location     [facility]    Dating     11/21/2022, by Patient Reported         Language     English    Anatomy US      Normal       Flu Vaccine     offer    Genetic Screen     NIPS: Negative      TDaP vaccine      09/03/22    Hgb A1C or   GTT    Early :  Third trimester : 48      Covid    Has booster         LAB RESULTS       Rhogam     B/Positiv   SVD (spontaneous vaginal delivery) 10/07/2022   Trimalleolar fracture 03/2023   left    Patient Active Problem List   Diagnosis Date Noted   Chronic pain of left ankle 03/17/2024   Iron deficiency anemia 03/17/2024   History of fracture of left ankle 03/17/2024   Intrauterine device 01/11/2023    Past Surgical History:  Procedure Laterality Date   ORIF ANKLE FRACTURE Left 05/04/2023   Procedure: OPEN REDUCTION INTERNAL FIXATION (ORIF) ANKLE FRACTURE;  Surgeon: Tobie Franky SQUIBB, DPM;  Location: [facility] ORS;  Service: Orthopedics/Podiatry;  Laterality:  Left;  POPLITEAL/SAPHENOUS BLOCK   WRIST SURGERY Left     OB History     Gravida  3   Para  2   Term  2   Preterm  0   AB  1   Living  2      SAB      IAB      Ectopic      Multiple  0   Live Births  2            Home Medications    Prior to Admission medications  Medication Sig Start Date End Date Taking? Authorizing Provider  cetirizine  (ZYRTEC  ALLERGY) 10 MG tablet Take 1 tablet (10 mg total) by mouth daily. 07/07/24 07/14/24 Yes Corlis Burnard DEL, NP  predniSONE  (STERAPRED UNI-PAK 21 TAB) 10 MG (21) TBPK tablet Take by mouth daily. As directed 07/07/24  Yes Corlis Burnard DEL, NP  acetaminophen  (TYLENOL ) 500 MG tablet Take  1,000 mg by mouth every 8 (eight) hours as needed (pain.).    [provider]  clindamycin  (CLEOCIN ) 2 % vaginal cream Place 1 Applicatorful vaginally at bedtime. Patient not taking: Reported on 07/07/2024 05/23/24   DominicJinnie Jansky, CNM  ferrous sulfate 324 MG TBEC Take 324 mg by mouth.    [provider]  fluticasone  (FLONASE ) 50 MCG/ACT nasal spray Place 2 sprays into both nostrils daily. 06/14/24   Vivienne Delon HERO, PA-C  ibuprofen  (ADVIL ) 800 MG tablet Take 1 tablet (800 mg total) by mouth every 6 (six) hours as needed. 04/24/24   Tobie Franky SQUIBB, DPM  lidocaine  (XYLOCAINE ) 2 % solution Use as directed 5-10 mLs in the mouth or throat every 6 (six) hours as needed (sore throat). Patient not taking: Reported on 07/07/2024 06/14/24   Burnette, Jennifer M, PA-C  metroNIDAZOLE  (FLAGYL ) 500 MG tablet Take 1 tablet (500 mg total) by mouth 2 (two) times daily. Patient not taking: Reported on 07/07/2024 05/22/24   DominicJinnie Jansky, CNM  oxyCODONE -acetaminophen  (PERCOCET) 5-325 MG tablet Take 1 tablet by mouth every 4 (four) hours as needed for severe pain (pain score 7-10). Patient not taking: Reported on 07/07/2024 05/02/24   Tobie Franky SQUIBB, DPM    Family History Family History  Problem Relation Age of Onset   Anxiety disorder  Sister    Depression Sister    Breast cancer Maternal Grandmother 45   Sickle cell anemia Paternal Grandmother    Breast cancer Paternal Great-grandmother    Lung cancer Paternal Great-grandmother    Throat cancer Paternal Great-grandmother     Social History Social History[1]   Allergies   Latex   Review of Systems Review of Systems  Constitutional:  Negative for chills and fever.  Skin:  Positive for color change and rash.     Physical Exam Triage Vital Signs ED Triage Vitals [07/07/24 0853]  Encounter Vitals Group     BP 118/80     Girls Systolic BP Percentile      Girls Diastolic BP Percentile      Boys Systolic BP Percentile      Boys Diastolic BP Percentile      Pulse Rate 83     Resp 18     Temp 98 F (36.7 C)     Temp src      SpO2 97 %     Weight      Height      Head Circumference      Peak Flow      Pain Score      Pain Loc      Pain Education      Exclude from Growth Chart    No data found.  Updated Vital Signs BP 118/80   Pulse 83   Temp 98 F (36.7 C)   Resp 18   SpO2 97%   Breastfeeding Yes   Visual Acuity Right Eye Distance:   Left Eye Distance:   Bilateral Distance:    Right Eye Near:   Left Eye Near:    Bilateral Near:     Physical Exam Constitutional:      General: She is not in acute distress. HENT:     Mouth/Throat:     Mouth: Mucous membranes are moist.  Cardiovascular:     Rate and Rhythm: Normal rate.  Pulmonary:     Effort: Pulmonary effort is normal. No respiratory distress.  Skin:    General: Skin is warm and dry.  Findings: Rash present.     Comments: 6 insect bites on extremities: The areas appear to be mosquito bites or possibly spider bite.  No open wounds or drainage.  Mild localized inflammation and light erythema around each area.  Neurological:     Mental Status: She is alert.      UC Treatments / Results  Labs (all labs ordered are listed, but only abnormal results are displayed) Labs  Reviewed - No data to display  EKG   Radiology No results found.  Procedures Procedures (including critical care time)  Medications Ordered in UC Medications - No data to display  Initial Impression / Assessment and Plan / UC Course  I have reviewed the triage vital signs and the nursing notes.  Pertinent labs & imaging results that were available during my care of the patient were reviewed by me and considered in my medical decision making (see chart for details).   Rash due to insect bites, lactating mother.  Afebrile and vital signs are stable.  These do not appear to be bedbugs.  Treating today with Zyrtec  and prednisone .  Education provided on insect bites.  Instructed patient to follow-up with her PCP if she is not improving.  She agrees to plan of care.   Final Clinical Impressions(s) / UC Diagnoses   Final diagnoses:  Rash  Insect bite, unspecified site, initial encounter  Lactating mother     Discharge Instructions      Take the Zyrtec  and prednisone  as directed for your itchy insect bites.    Follow-up with your primary care provider if your symptoms are not improving.      ED Prescriptions     Medication Sig Dispense Auth. Provider   predniSONE  (STERAPRED UNI-PAK 21 TAB) 10 MG (21) TBPK tablet Take by mouth daily. As directed 21 tablet Corlis Burnard DEL, NP   cetirizine  (ZYRTEC  ALLERGY) 10 MG tablet Take 1 tablet (10 mg total) by mouth daily. 7 tablet Corlis Burnard DEL, NP      PDMP not reviewed this encounter.    [1]  Social History Tobacco Use   Smoking status: Never   Smokeless tobacco: Never  Vaping Use   Vaping status: Never Used  Substance Use Topics   Alcohol use: Yes    Comment: occ   Drug use: No     Corlis Burnard DEL, NP 07/07/24 [phone]  "

## 2024-07-25 ENCOUNTER — Telehealth: Payer: Self-pay | Admitting: Podiatry

## 2024-07-25 NOTE — Telephone Encounter (Signed)
 Forms from [employer]. Send form/notes from as visit 04/2024- Dr. Tobie released pt Faxed [phone]

## 2024-08-03 ENCOUNTER — Telehealth: Payer: Self-pay | Admitting: Podiatry

## 2024-08-03 NOTE — Telephone Encounter (Signed)
 cld pt bk from mess left about last forms sent to [employer]. I adv her correction was made as to RTW 1.12.26 and not 05/16/24-date of last visit. I faxed [phone] and emailed copy to pt for her records.

## 2024-11-01 ENCOUNTER — Ambulatory Visit: Admitting: Family Medicine
